# Patient Record
Sex: Female | Born: 1965 | Race: Black or African American | Hispanic: No | Marital: Single | Smoking: Never smoker
Health system: Southern US, Community
[De-identification: ages and names within clinical notes are randomized; demographics above are authoritative.]

## PROBLEM LIST (undated history)

## (undated) DIAGNOSIS — K219 Gastro-esophageal reflux disease without esophagitis: Secondary | ICD-10-CM

## (undated) DIAGNOSIS — I10 Essential (primary) hypertension: Secondary | ICD-10-CM

## (undated) DIAGNOSIS — E119 Type 2 diabetes mellitus without complications: Secondary | ICD-10-CM

## (undated) DIAGNOSIS — E669 Obesity, unspecified: Secondary | ICD-10-CM

## (undated) HISTORY — DX: Gastro-esophageal reflux disease without esophagitis: K21.9

## (undated) HISTORY — PX: HERNIA REPAIR: SHX51

## (undated) HISTORY — PX: BARIATRIC SURGERY: SHX1103

## (undated) HISTORY — PX: CHOLECYSTECTOMY: SHX55

---

## 2004-03-14 ENCOUNTER — Emergency Department (HOSPITAL_COMMUNITY): Admission: EM | Admit: 2004-03-14 | Discharge: 2004-03-14 | Payer: Self-pay | Admitting: Emergency Medicine

## 2004-04-17 ENCOUNTER — Emergency Department (HOSPITAL_COMMUNITY): Admission: EM | Admit: 2004-04-17 | Discharge: 2004-04-17 | Payer: Self-pay | Admitting: Emergency Medicine

## 2004-09-12 ENCOUNTER — Emergency Department: Payer: Self-pay | Admitting: General Practice

## 2004-10-07 ENCOUNTER — Emergency Department: Payer: Self-pay | Admitting: Emergency Medicine

## 2005-02-07 ENCOUNTER — Emergency Department: Payer: Self-pay | Admitting: Emergency Medicine

## 2005-02-07 ENCOUNTER — Emergency Department (HOSPITAL_COMMUNITY): Admission: EM | Admit: 2005-02-07 | Discharge: 2005-02-07 | Payer: Self-pay | Admitting: Emergency Medicine

## 2005-06-08 ENCOUNTER — Emergency Department: Payer: Self-pay | Admitting: Emergency Medicine

## 2005-06-09 ENCOUNTER — Other Ambulatory Visit: Payer: Self-pay

## 2005-08-17 ENCOUNTER — Emergency Department: Payer: Self-pay | Admitting: Emergency Medicine

## 2005-09-20 ENCOUNTER — Emergency Department: Payer: Self-pay | Admitting: Emergency Medicine

## 2005-12-12 ENCOUNTER — Emergency Department (HOSPITAL_COMMUNITY): Admission: EM | Admit: 2005-12-12 | Discharge: 2005-12-12 | Payer: Self-pay | Admitting: Emergency Medicine

## 2006-02-03 ENCOUNTER — Emergency Department (HOSPITAL_COMMUNITY): Admission: EM | Admit: 2006-02-03 | Discharge: 2006-02-03 | Payer: Self-pay | Admitting: Emergency Medicine

## 2006-04-13 ENCOUNTER — Emergency Department (HOSPITAL_COMMUNITY): Admission: EM | Admit: 2006-04-13 | Discharge: 2006-04-14 | Payer: Self-pay | Admitting: Emergency Medicine

## 2006-06-17 ENCOUNTER — Emergency Department (HOSPITAL_COMMUNITY): Admission: EM | Admit: 2006-06-17 | Discharge: 2006-06-18 | Payer: Self-pay | Admitting: Emergency Medicine

## 2006-08-13 ENCOUNTER — Emergency Department: Payer: Self-pay | Admitting: Emergency Medicine

## 2006-09-02 ENCOUNTER — Emergency Department (HOSPITAL_COMMUNITY): Admission: EM | Admit: 2006-09-02 | Discharge: 2006-09-02 | Payer: Self-pay | Admitting: Emergency Medicine

## 2006-12-07 ENCOUNTER — Emergency Department (HOSPITAL_COMMUNITY): Admission: EM | Admit: 2006-12-07 | Discharge: 2006-12-07 | Payer: Self-pay | Admitting: Emergency Medicine

## 2007-07-10 ENCOUNTER — Emergency Department (HOSPITAL_COMMUNITY): Admission: EM | Admit: 2007-07-10 | Discharge: 2007-07-11 | Payer: Self-pay | Admitting: *Deleted

## 2007-09-19 ENCOUNTER — Inpatient Hospital Stay (HOSPITAL_COMMUNITY): Admission: AD | Admit: 2007-09-19 | Discharge: 2007-09-20 | Payer: Self-pay | Admitting: Obstetrics & Gynecology

## 2007-11-06 ENCOUNTER — Emergency Department (HOSPITAL_COMMUNITY): Admission: EM | Admit: 2007-11-06 | Discharge: 2007-11-06 | Payer: Self-pay | Admitting: Emergency Medicine

## 2007-11-08 ENCOUNTER — Emergency Department (HOSPITAL_COMMUNITY): Admission: EM | Admit: 2007-11-08 | Discharge: 2007-11-09 | Payer: Self-pay | Admitting: Emergency Medicine

## 2008-04-15 ENCOUNTER — Emergency Department (HOSPITAL_COMMUNITY): Admission: EM | Admit: 2008-04-15 | Discharge: 2008-04-16 | Payer: Self-pay | Admitting: Emergency Medicine

## 2008-08-07 ENCOUNTER — Emergency Department (HOSPITAL_COMMUNITY): Admission: EM | Admit: 2008-08-07 | Discharge: 2008-08-07 | Payer: Self-pay | Admitting: Emergency Medicine

## 2008-10-15 ENCOUNTER — Emergency Department (HOSPITAL_COMMUNITY): Admission: EM | Admit: 2008-10-15 | Discharge: 2008-10-16 | Payer: Self-pay | Admitting: Emergency Medicine

## 2009-03-11 ENCOUNTER — Emergency Department (HOSPITAL_COMMUNITY): Admission: EM | Admit: 2009-03-11 | Discharge: 2009-03-12 | Payer: Self-pay | Admitting: Emergency Medicine

## 2009-05-04 ENCOUNTER — Emergency Department (HOSPITAL_COMMUNITY): Admission: EM | Admit: 2009-05-04 | Discharge: 2009-05-04 | Payer: Self-pay | Admitting: Emergency Medicine

## 2009-05-24 ENCOUNTER — Emergency Department (HOSPITAL_COMMUNITY): Admission: EM | Admit: 2009-05-24 | Discharge: 2009-05-24 | Payer: Self-pay | Admitting: Emergency Medicine

## 2010-01-28 ENCOUNTER — Emergency Department (HOSPITAL_COMMUNITY): Admission: EM | Admit: 2010-01-28 | Discharge: 2010-01-29 | Payer: Self-pay | Admitting: Emergency Medicine

## 2010-03-25 ENCOUNTER — Emergency Department (HOSPITAL_COMMUNITY): Admission: EM | Admit: 2010-03-25 | Discharge: 2010-03-26 | Payer: Self-pay | Admitting: Emergency Medicine

## 2010-04-05 ENCOUNTER — Emergency Department (HOSPITAL_COMMUNITY): Admission: EM | Admit: 2010-04-05 | Discharge: 2010-04-06 | Payer: Self-pay | Admitting: Emergency Medicine

## 2010-04-20 ENCOUNTER — Encounter (INDEPENDENT_AMBULATORY_CARE_PROVIDER_SITE_OTHER): Payer: Self-pay | Admitting: *Deleted

## 2010-04-20 ENCOUNTER — Ambulatory Visit: Payer: Self-pay | Admitting: Obstetrics and Gynecology

## 2010-04-20 LAB — CONVERTED CEMR LAB
Hgb A1c MFr Bld: 6.2 % — ABNORMAL HIGH (ref <5.7)
TSH: 1.175 microintl units/mL (ref 0.350–4.500)

## 2010-04-26 ENCOUNTER — Emergency Department (HOSPITAL_COMMUNITY): Admission: EM | Admit: 2010-04-26 | Discharge: 2010-04-27 | Payer: Self-pay | Admitting: Emergency Medicine

## 2010-05-04 ENCOUNTER — Ambulatory Visit (HOSPITAL_COMMUNITY)
Admission: RE | Admit: 2010-05-04 | Discharge: 2010-05-04 | Payer: Self-pay | Source: Home / Self Care | Admitting: Family Medicine

## 2010-05-06 ENCOUNTER — Ambulatory Visit: Payer: Self-pay | Admitting: Obstetrics & Gynecology

## 2010-06-01 ENCOUNTER — Ambulatory Visit (HOSPITAL_COMMUNITY): Admission: RE | Admit: 2010-06-01 | Payer: Self-pay | Source: Home / Self Care | Admitting: Obstetrics and Gynecology

## 2010-06-04 ENCOUNTER — Ambulatory Visit: Payer: Self-pay

## 2010-06-06 ENCOUNTER — Encounter: Payer: Self-pay | Admitting: *Deleted

## 2010-07-04 ENCOUNTER — Encounter: Payer: Self-pay | Admitting: *Deleted

## 2010-07-16 NOTE — Miscellaneous (Signed)
Summary: Do Not Reschedule  No showed NP appt.  Per [facility] policy is not allowed to reschedule.  Dennison Nancy RN  June 06, 2010 8:51 AM

## 2010-07-21 ENCOUNTER — Inpatient Hospital Stay (INDEPENDENT_AMBULATORY_CARE_PROVIDER_SITE_OTHER)
Admission: RE | Admit: 2010-07-21 | Discharge: 2010-07-21 | Disposition: A | Discharge status: Home / Self Care | Payer: PRIVATE HEALTH INSURANCE | Source: Ambulatory Visit | Attending: Family Medicine | Admitting: Family Medicine

## 2010-07-21 DIAGNOSIS — J069 Acute upper respiratory infection, unspecified: Secondary | ICD-10-CM

## 2010-08-17 ENCOUNTER — Emergency Department: Payer: Self-pay | Admitting: Emergency Medicine

## 2010-08-17 ENCOUNTER — Emergency Department (HOSPITAL_COMMUNITY)
Admission: EM | Admit: 2010-08-17 | Discharge: 2010-08-18 | Disposition: A | Discharge status: Home / Self Care | Payer: PRIVATE HEALTH INSURANCE | Attending: Emergency Medicine | Admitting: Emergency Medicine

## 2010-08-25 LAB — POCT I-STAT, CHEM 8
BUN: 9 mg/dL (ref 6–23)
Calcium, Ion: 1.18 mmol/L (ref 1.12–1.32)
Chloride: 105 mEq/L (ref 96–112)
Creatinine, Ser: 0.8 mg/dL (ref 0.4–1.2)
Glucose, Bld: 117 mg/dL — ABNORMAL HIGH (ref 70–99)
HCT: 37 % (ref 36.0–46.0)
Hemoglobin: 12.6 g/dL (ref 12.0–15.0)
Potassium: 4.1 mEq/L (ref 3.5–5.1)
Sodium: 138 mEq/L (ref 135–145)
TCO2: 23 mmol/L (ref 0–100)

## 2010-08-26 LAB — RAPID STREP SCREEN (MED CTR MEBANE ONLY): Streptococcus, Group A Screen (Direct): NEGATIVE

## 2010-09-18 LAB — COMPREHENSIVE METABOLIC PANEL
ALT: 12 U/L (ref 0–35)
AST: 15 U/L (ref 0–37)
Albumin: 3.7 g/dL (ref 3.5–5.2)
Alkaline Phosphatase: 51 U/L (ref 39–117)
BUN: 6 mg/dL (ref 6–23)
CO2: 26 mEq/L (ref 19–32)
Calcium: 9.2 mg/dL (ref 8.4–10.5)
Chloride: 109 mEq/L (ref 96–112)
Creatinine, Ser: 0.73 mg/dL (ref 0.4–1.2)
GFR calc Af Amer: >60 mL/min (ref >60)
GFR calc non Af Amer: >60 mL/min (ref >60)
Glucose, Bld: 117 mg/dL — ABNORMAL HIGH (ref 70–99)
Potassium: 4.2 mEq/L (ref 3.5–5.1)
Sodium: 138 mEq/L (ref 135–145)
Total Bilirubin: 0.8 mg/dL (ref 0.3–1.2)
Total Protein: 7.3 g/dL (ref 6.0–8.3)

## 2010-09-18 LAB — POCT PREGNANCY, URINE: Preg Test, Ur: NEGATIVE

## 2010-09-29 LAB — POCT I-STAT, CHEM 8
BUN: 10 mg/dL (ref 6–23)
Calcium, Ion: 1.16 mmol/L (ref 1.12–1.32)
Chloride: 106 mEq/L (ref 96–112)
Creatinine, Ser: 0.7 mg/dL (ref 0.4–1.2)
Glucose, Bld: 119 mg/dL — ABNORMAL HIGH (ref 70–99)
HCT: 33 % — ABNORMAL LOW (ref 36.0–46.0)
Hemoglobin: 11.2 g/dL — ABNORMAL LOW (ref 12.0–15.0)
Potassium: 3.6 mEq/L (ref 3.5–5.1)
Sodium: 138 mEq/L (ref 135–145)
TCO2: 23 mmol/L (ref 0–100)

## 2010-10-17 ENCOUNTER — Inpatient Hospital Stay (INDEPENDENT_AMBULATORY_CARE_PROVIDER_SITE_OTHER)
Admission: RE | Admit: 2010-10-17 | Discharge: 2010-10-17 | Disposition: A | Discharge status: Home / Self Care | Payer: PRIVATE HEALTH INSURANCE | Source: Ambulatory Visit | Attending: Family Medicine | Admitting: Family Medicine

## 2010-10-17 DIAGNOSIS — M7989 Other specified soft tissue disorders: Secondary | ICD-10-CM

## 2010-10-17 DIAGNOSIS — I10 Essential (primary) hypertension: Secondary | ICD-10-CM

## 2010-10-17 LAB — POCT URINALYSIS DIP (DEVICE)
Bilirubin Urine: NEGATIVE
Glucose, UA: NEGATIVE mg/dL
Hgb urine dipstick: NEGATIVE
Ketones, ur: NEGATIVE mg/dL
Nitrite: NEGATIVE
Protein, ur: NEGATIVE mg/dL
Specific Gravity, Urine: >=1.03 (ref 1.005–1.030)
Urobilinogen, UA: 1 mg/dL (ref 0.0–1.0)
pH: 6 (ref 5.0–8.0)

## 2010-10-17 LAB — POCT I-STAT, CHEM 8
BUN: 7 mg/dL (ref 6–23)
Calcium, Ion: 1.11 mmol/L — ABNORMAL LOW (ref 1.12–1.32)
Chloride: 103 mEq/L (ref 96–112)
Creatinine, Ser: 0.9 mg/dL (ref 0.4–1.2)
Glucose, Bld: 100 mg/dL — ABNORMAL HIGH (ref 70–99)
HCT: 39 % (ref 36.0–46.0)
Hemoglobin: 13.3 g/dL (ref 12.0–15.0)
Potassium: 3.7 mEq/L (ref 3.5–5.1)
Sodium: 138 mEq/L (ref 135–145)
TCO2: 24 mmol/L (ref 0–100)

## 2010-12-28 ENCOUNTER — Encounter: Payer: Self-pay | Admitting: *Deleted

## 2010-12-28 ENCOUNTER — Emergency Department (HOSPITAL_BASED_OUTPATIENT_CLINIC_OR_DEPARTMENT_OTHER)
Admission: EM | Admit: 2010-12-28 | Discharge: 2010-12-28 | Disposition: A | Discharge status: Home / Self Care | Payer: PRIVATE HEALTH INSURANCE | Attending: Emergency Medicine | Admitting: Emergency Medicine

## 2010-12-28 DIAGNOSIS — E860 Dehydration: Secondary | ICD-10-CM | POA: Insufficient documentation

## 2010-12-28 DIAGNOSIS — I10 Essential (primary) hypertension: Secondary | ICD-10-CM | POA: Insufficient documentation

## 2010-12-28 DIAGNOSIS — R110 Nausea: Secondary | ICD-10-CM | POA: Insufficient documentation

## 2010-12-28 DIAGNOSIS — R51 Headache: Secondary | ICD-10-CM | POA: Insufficient documentation

## 2010-12-28 HISTORY — DX: Essential (primary) hypertension: I10

## 2010-12-28 LAB — URINALYSIS, ROUTINE W REFLEX MICROSCOPIC
Glucose, UA: NEGATIVE mg/dL
Hgb urine dipstick: NEGATIVE
Protein, ur: NEGATIVE mg/dL
Specific Gravity, Urine: 1.022 (ref 1.005–1.030)

## 2010-12-28 LAB — BASIC METABOLIC PANEL
CO2: 24 mEq/L (ref 19–32)
Chloride: 101 mEq/L (ref 96–112)
Sodium: 134 mEq/L — ABNORMAL LOW (ref 135–145)

## 2010-12-28 LAB — PREGNANCY, URINE: Preg Test, Ur: NEGATIVE

## 2010-12-28 MED ORDER — ONDANSETRON 4 MG PO TBDP: 4.0000 mg | ORAL_TABLET | Freq: Three times a day (TID) | ORAL | Status: AC | PRN

## 2010-12-28 MED ORDER — KETOROLAC TROMETHAMINE 30 MG/ML IJ SOLN
30.0000 mg | Freq: Once | INTRAMUSCULAR | Status: AC
  Administered 2010-12-28: 30 mg via INTRAVENOUS
  Filled 2010-12-28: qty 1

## 2010-12-28 MED ORDER — ONDANSETRON HCL 4 MG/2ML IJ SOLN
4.0000 mg | Freq: Once | INTRAMUSCULAR | Status: AC
  Administered 2010-12-28: 4 mg via INTRAVENOUS
  Filled 2010-12-28: qty 2

## 2010-12-28 MED ORDER — SODIUM CHLORIDE 0.9 % IV BOLUS (SEPSIS)
1000.0000 mL | Freq: Once | INTRAVENOUS | Status: AC
  Administered 2010-12-28: 1000 mL via INTRAVENOUS

## 2010-12-28 NOTE — ED Provider Notes (Signed)
History     Chief Complaint  Patient presents with  . Fatigue   HPI Comments: Pt states that she awoke this AM feeling well, but then as the day went on she developed a headache on the top of the head an the back of the head and feeling achy all over.  She then tried to eat lunch - chicken breast sandwhich but became nauseated and vomited X 1 - she then improved but has persisted with nasuea for the last hour.  Nothing makes better or worse, no associated fevesrr, stiff neck, rash, abd pain, cough, sob, cp, blurred vision, weakness or numbness.  She admits to mild fatigue.  She has hx of Htn but no other medical problems.  Sx are moderate at this time.  The history is provided by the patient and a relative.    Past Medical History  Diagnosis Date  . Hypertension     History reviewed. No pertinent past surgical history.  History reviewed. No pertinent family history.  History  Substance Use Topics  . Smoking status: Never Smoker   . Smokeless tobacco: Never Used  . Alcohol Use: No    OB History    Grav Para Term Preterm Abortions TAB SAB Ect Mult Living                  Review of Systems  All other systems reviewed and are negative.    Physical Exam  BP 169/83  Pulse 82  Temp(Src) 98.9 F (37.2 C) (Oral)  Ht 5\' 4"  (1.626 m)  Wt 238 lb (107.956 kg)  BMI 40.85 kg/m2  SpO2 100%  Physical Exam  Constitutional: She appears well-developed and well-nourished. No distress.  HENT:  Head: Normocephalic and atraumatic.  Mouth/Throat: Oropharynx is clear and moist. No oropharyngeal exudate.  Eyes: Conjunctivae and EOM are normal. Pupils are equal, round, and reactive to light. Right eye exhibits no discharge. Left eye exhibits no discharge. No scleral icterus.  Neck: Normal range of motion. Neck supple. No JVD present. No thyromegaly present.  Cardiovascular: Normal rate, regular rhythm, normal heart sounds and intact distal pulses.  Exam reveals no gallop and no friction  rub.   No murmur heard. Pulmonary/Chest: Effort normal and breath sounds normal. No respiratory distress. She has no wheezes. She has no rales.  Abdominal: Soft. Bowel sounds are normal. She exhibits no distension and no mass. There is no tenderness.  Musculoskeletal: Normal range of motion. She exhibits edema ( mild bil LE edema (hx of same)). She exhibits no tenderness.  Lymphadenopathy:    She has no cervical adenopathy.  Neurological: She is alert. Coordination normal.  Skin: Skin is warm and dry. No rash noted. She is not diaphoretic. No erythema.  Psychiatric: She has a normal mood and affect. Her behavior is normal.    ED Course  Procedures  MDM Pt appears well but has mild ha which is only other sx - no focal neuro defectis - check BMP and UA / preg - meds for ha and nasuea.  The patient has improved almost completely with the Zofran and the IV fluids. Urinalysis confirms dehydration but no signs of renal dysfunction or significant electrolyte abnormalities. I discussed with patient the need for close followup and she agrees to return if symptoms worsen.    Vida Roller, MD 12/28/10 [phone]

## 2010-12-28 NOTE — ED Notes (Signed)
Pt here via ems from home states weak and shaky generally not feeling well all day today cbg per ems 85 no nausea vomiting diarrhea

## 2011-01-17 ENCOUNTER — Emergency Department (HOSPITAL_COMMUNITY)
Admission: EM | Admit: 2011-01-17 | Discharge: 2011-01-18 | Disposition: A | Discharge status: Home / Self Care | Payer: PRIVATE HEALTH INSURANCE | Attending: Emergency Medicine | Admitting: Emergency Medicine

## 2011-01-17 DIAGNOSIS — R51 Headache: Secondary | ICD-10-CM | POA: Insufficient documentation

## 2011-01-17 DIAGNOSIS — I10 Essential (primary) hypertension: Secondary | ICD-10-CM | POA: Insufficient documentation

## 2011-01-17 DIAGNOSIS — R112 Nausea with vomiting, unspecified: Secondary | ICD-10-CM | POA: Insufficient documentation

## 2011-01-17 DIAGNOSIS — E669 Obesity, unspecified: Secondary | ICD-10-CM | POA: Insufficient documentation

## 2011-01-17 DIAGNOSIS — Z79899 Other long term (current) drug therapy: Secondary | ICD-10-CM | POA: Insufficient documentation

## 2011-01-17 DIAGNOSIS — R630 Anorexia: Secondary | ICD-10-CM | POA: Insufficient documentation

## 2011-01-17 DIAGNOSIS — B9789 Other viral agents as the cause of diseases classified elsewhere: Secondary | ICD-10-CM | POA: Insufficient documentation

## 2011-01-17 DIAGNOSIS — IMO0001 Reserved for inherently not codable concepts without codable children: Secondary | ICD-10-CM | POA: Insufficient documentation

## 2011-01-17 DIAGNOSIS — R6883 Chills (without fever): Secondary | ICD-10-CM | POA: Insufficient documentation

## 2011-01-17 DIAGNOSIS — R070 Pain in throat: Secondary | ICD-10-CM | POA: Insufficient documentation

## 2011-01-17 LAB — POCT I-STAT, CHEM 8
BUN: 6 mg/dL (ref 6–23)
Chloride: 102 mEq/L (ref 96–112)
Creatinine, Ser: 0.8 mg/dL (ref 0.50–1.10)
Potassium: 3.8 mEq/L (ref 3.5–5.1)
Sodium: 138 mEq/L (ref 135–145)

## 2011-01-17 LAB — URINALYSIS, ROUTINE W REFLEX MICROSCOPIC
Glucose, UA: NEGATIVE mg/dL
Hgb urine dipstick: NEGATIVE
Specific Gravity, Urine: 1.017 (ref 1.005–1.030)
Urobilinogen, UA: 1 mg/dL (ref 0.0–1.0)

## 2011-01-17 LAB — CBC
HCT: 35.3 % — ABNORMAL LOW (ref 36.0–46.0)
MCV: 79.5 fL (ref 78.0–100.0)
Platelets: 364 10*3/uL (ref 150–400)
RBC: 4.44 MIL/uL (ref 3.87–5.11)
WBC: 6.6 10*3/uL (ref 4.0–10.5)

## 2011-03-09 LAB — SAMPLE TO BLOOD BANK

## 2011-03-09 LAB — CBC
HCT: 31.4 — ABNORMAL LOW
Hemoglobin: 10.9 — ABNORMAL LOW
MCHC: 34.6
MCV: 81.3
Platelets: 365
RBC: 3.86 — ABNORMAL LOW
RDW: 15.9 — ABNORMAL HIGH
WBC: 6.3

## 2011-03-09 LAB — POCT PREGNANCY, URINE
Operator id: 220991
Preg Test, Ur: NEGATIVE

## 2011-03-09 LAB — WET PREP, GENITAL: Yeast Wet Prep HPF POC: NONE SEEN

## 2011-03-10 LAB — CBC
HCT: 35.1 — ABNORMAL LOW
Hemoglobin: 11.7 — ABNORMAL LOW
RBC: 4.39
RDW: 13.9
WBC: 8.3

## 2011-03-10 LAB — COMPREHENSIVE METABOLIC PANEL
Alkaline Phosphatase: 52
BUN: 7
CO2: 26
Chloride: 102
Creatinine, Ser: 0.83
GFR calc non Af Amer: >60
Glucose, Bld: 115 — ABNORMAL HIGH
Total Bilirubin: 0.9

## 2011-03-10 LAB — RAPID STREP SCREEN (MED CTR MEBANE ONLY): Streptococcus, Group A Screen (Direct): NEGATIVE

## 2011-03-10 LAB — DIFFERENTIAL
Eosinophils Relative: 3
Lymphocytes Relative: 33
Lymphs Abs: 2.7
Monocytes Absolute: 0.7
Monocytes Relative: 9

## 2011-03-29 ENCOUNTER — Inpatient Hospital Stay (INDEPENDENT_AMBULATORY_CARE_PROVIDER_SITE_OTHER)
Admission: RE | Admit: 2011-03-29 | Discharge: 2011-03-29 | Disposition: A | Discharge status: Home / Self Care | Payer: PRIVATE HEALTH INSURANCE | Source: Ambulatory Visit | Attending: Family Medicine | Admitting: Family Medicine

## 2011-03-29 DIAGNOSIS — J40 Bronchitis, not specified as acute or chronic: Secondary | ICD-10-CM

## 2011-03-31 LAB — CBC
MCHC: 33.2
Platelets: 361
RBC: 4.03
WBC: 4.7

## 2011-03-31 LAB — COMPREHENSIVE METABOLIC PANEL
BUN: 9
CO2: 24
Chloride: 108
Creatinine, Ser: 0.7
GFR calc non Af Amer: >60
Glucose, Bld: 99
Total Bilirubin: 0.8

## 2011-03-31 LAB — DIFFERENTIAL
Basophils Absolute: 0
Lymphocytes Relative: 53 — ABNORMAL HIGH
Neutro Abs: 1.5 — ABNORMAL LOW

## 2011-03-31 LAB — B-NATRIURETIC PEPTIDE (CONVERTED LAB): Pro B Natriuretic peptide (BNP): 46

## 2011-04-07 ENCOUNTER — Emergency Department (HOSPITAL_COMMUNITY)
Admission: EM | Admit: 2011-04-07 | Discharge: 2011-04-08 | Disposition: A | Discharge status: Home / Self Care | Payer: PRIVATE HEALTH INSURANCE | Attending: Emergency Medicine | Admitting: Emergency Medicine

## 2011-04-07 DIAGNOSIS — R070 Pain in throat: Secondary | ICD-10-CM | POA: Insufficient documentation

## 2011-04-07 DIAGNOSIS — I10 Essential (primary) hypertension: Secondary | ICD-10-CM | POA: Insufficient documentation

## 2011-04-07 DIAGNOSIS — J039 Acute tonsillitis, unspecified: Secondary | ICD-10-CM | POA: Insufficient documentation

## 2011-07-16 ENCOUNTER — Other Ambulatory Visit: Payer: Self-pay

## 2011-07-16 ENCOUNTER — Encounter (HOSPITAL_COMMUNITY): Payer: Self-pay | Admitting: Emergency Medicine

## 2011-07-16 ENCOUNTER — Emergency Department (HOSPITAL_COMMUNITY)
Admission: EM | Admit: 2011-07-16 | Discharge: 2011-07-16 | Discharge status: Against Medical Advice | Payer: PRIVATE HEALTH INSURANCE | Attending: Emergency Medicine | Admitting: Emergency Medicine

## 2011-07-16 DIAGNOSIS — R110 Nausea: Secondary | ICD-10-CM | POA: Insufficient documentation

## 2011-07-16 DIAGNOSIS — R1013 Epigastric pain: Secondary | ICD-10-CM | POA: Insufficient documentation

## 2011-07-16 NOTE — ED Provider Notes (Signed)
Patient not in room at 1402, 1415, or 1430.   Date: 07/16/2011  Rate: 81  Rhythm: normal sinus rhythm  QRS Axis: right  Intervals: nl  ST/T Wave abnormalities: normal  Conduction Disutrbances:none  Narrative Interpretation:   Old EKG Reviewed: none available  Pt left without being seen  Devoria Albe, MD, Armando Gang     Ward Givens, MD 07/16/11 1435

## 2011-07-16 NOTE — ED Notes (Signed)
Pt here with epigastric pain, occurred this morning sts it is on and off, denies it being related to food. Pt is stable. sts she has not attempted OTC meds.

## 2011-07-16 NOTE — ED Notes (Signed)
Upper abd pain  Started this am 9 am and then went away  nausea

## 2011-07-16 NOTE — ED Notes (Signed)
Unable to locate pt  

## 2011-07-16 NOTE — ED Notes (Signed)
uanble to locate pt x 2.

## 2011-09-05 ENCOUNTER — Emergency Department (HOSPITAL_COMMUNITY)
Admission: EM | Admit: 2011-09-05 | Discharge: 2011-09-05 | Disposition: A | Discharge status: Home / Self Care | Payer: PRIVATE HEALTH INSURANCE | Attending: Emergency Medicine | Admitting: Emergency Medicine

## 2011-09-05 ENCOUNTER — Emergency Department (HOSPITAL_COMMUNITY): Payer: PRIVATE HEALTH INSURANCE

## 2011-09-05 ENCOUNTER — Emergency Department (INDEPENDENT_AMBULATORY_CARE_PROVIDER_SITE_OTHER)
Admission: EM | Admit: 2011-09-05 | Discharge: 2011-09-05 | Disposition: A | Discharge status: Home / Self Care | Payer: PRIVATE HEALTH INSURANCE | Source: Home / Self Care

## 2011-09-05 ENCOUNTER — Encounter (HOSPITAL_COMMUNITY): Payer: Self-pay | Admitting: *Deleted

## 2011-09-05 DIAGNOSIS — R51 Headache: Secondary | ICD-10-CM | POA: Insufficient documentation

## 2011-09-05 DIAGNOSIS — I10 Essential (primary) hypertension: Secondary | ICD-10-CM | POA: Insufficient documentation

## 2011-09-05 DIAGNOSIS — R112 Nausea with vomiting, unspecified: Secondary | ICD-10-CM | POA: Insufficient documentation

## 2011-09-05 MED ORDER — ONDANSETRON 4 MG PO TBDP
ORAL_TABLET | ORAL | Status: AC
  Filled 2011-09-05: qty 2

## 2011-09-05 MED ORDER — SODIUM CHLORIDE 0.9 % IV BOLUS (SEPSIS)
1000.0000 mL | Freq: Once | INTRAVENOUS | Status: AC
  Administered 2011-09-05: 1000 mL via INTRAVENOUS

## 2011-09-05 MED ORDER — HYDROMORPHONE HCL 1 MG/ML IJ SOLN: 1.0000 mg | Freq: Once | INTRAMUSCULAR | Status: DC

## 2011-09-05 MED ORDER — KETOROLAC TROMETHAMINE 30 MG/ML IJ SOLN
30.0000 mg | Freq: Once | INTRAMUSCULAR | Status: AC
  Administered 2011-09-05: 30 mg via INTRAVENOUS
  Filled 2011-09-05: qty 1

## 2011-09-05 MED ORDER — HYDROMORPHONE HCL PF 1 MG/ML IJ SOLN
INTRAMUSCULAR | Status: AC
  Filled 2011-09-05: qty 1

## 2011-09-05 MED ORDER — ONDANSETRON HCL 4 MG/2ML IJ SOLN
4.0000 mg | Freq: Once | INTRAMUSCULAR | Status: AC
  Administered 2011-09-05: 4 mg via INTRAVENOUS
  Filled 2011-09-05: qty 2

## 2011-09-05 MED ORDER — HYDROMORPHONE HCL 1 MG/ML IJ SOLN
1.0000 mg | Freq: Once | INTRAMUSCULAR | Status: AC
  Administered 2011-09-05: 1 mg via INTRAMUSCULAR

## 2011-09-05 MED ORDER — ONDANSETRON 4 MG PO TBDP
8.0000 mg | ORAL_TABLET | Freq: Once | ORAL | Status: AC
  Administered 2011-09-05: 8 mg via ORAL

## 2011-09-05 MED ORDER — DIPHENHYDRAMINE HCL 50 MG/ML IJ SOLN
25.0000 mg | Freq: Once | INTRAMUSCULAR | Status: AC
Start: 2011-09-05 — End: 2011-09-05
  Administered 2011-09-05: 25 mg via INTRAVENOUS
  Filled 2011-09-05: qty 1

## 2011-09-05 MED ORDER — PROMETHAZINE HCL 25 MG/ML IJ SOLN
12.5000 mg | Freq: Once | INTRAMUSCULAR | Status: AC
  Administered 2011-09-05: 12.5 mg via INTRAVENOUS
  Filled 2011-09-05: qty 1

## 2011-09-05 MED ORDER — LISINOPRIL 10 MG PO TABS: 10.0000 mg | ORAL_TABLET | Freq: Every day | ORAL | Status: DC

## 2011-09-05 NOTE — ED Notes (Signed)
Pt with c/o headache onset 2am this morning - left side of head - describes worse headache - nauseated denies vomiting  Pt had been taking lisinopril for htn ran out end of January - pt took extra strength tylenol at 730 am - with no relief

## 2011-09-05 NOTE — ED Provider Notes (Signed)
Medical screening examination/treatment/procedure(s) were performed by non-physician practitioner and as supervising physician I was immediately available for consultation/collaboration.  Doug Sou, MD 09/05/11 1755

## 2011-09-05 NOTE — ED Notes (Signed)
Pt with c/o headache/nausea - no neuro deficits - has not taken bp med since January ran out did not follow up with physician -

## 2011-09-05 NOTE — ED Notes (Signed)
Pt sent here from [facility] for ct scan due to severe headache this am since 0200. Has been out of bp meds x 2 months. Having nausea, left side headache no relief with tylenol. Given dilaudid and zofran at [facility].

## 2011-09-05 NOTE — ED Provider Notes (Signed)
Medical screening examination/treatment/procedure(s) were performed by non-physician practitioner and as supervising physician I was immediately available for consultation/collaboration.  Raynald Blend, MD 09/05/11 1148

## 2011-09-05 NOTE — ED Provider Notes (Signed)
History     CSN: [id number]  Arrival date & time 09/05/11  1115   First MD Initiated Contact with Patient 09/05/11 1210      Chief Complaint  Patient presents with  . Headache    (Consider location/radiation/quality/duration/timing/severity/associated sxs/prior treatment) HPI Natasha Doyle is a 46 yo female with a history of HTN, who presents to the ED at the request of the [facility].  Pt states that she awoke this morning with a headache around 2am.  States that she went back to sleep, but at 6 am, the headache was getting worse.  She describes the HA as a sharp pain, rating at 9/10 at 6am, which was on the left frontal and temporal side of her head.  She had some nausea and emesis with it, no vision changes, hearing changes, weakness or tingling on her body.  She did not took 2 acetaminophen before coming to the urgent care, but denies relief.  Since receiving medications at urgent care, she now rates the pain as a 7/10 and is resting and drowsy in bed.  She has not taken her blood pressure medication since the end of January.  She is followed by Deboraha Sprang for her HTN.  Patient denies chest pain, shortness of breath, weakness, numbness, or stiff neck.  Past Medical History  Diagnosis Date  . Hypertension     History reviewed. No pertinent past surgical history.  History reviewed. No pertinent family history.  History  Substance Use Topics  . Smoking status: Never Smoker   . Smokeless tobacco: Never Used  . Alcohol Use: No    OB History    Grav Para Term Preterm Abortions TAB SAB Ect Mult Living                  Review of Systems All pertinent positives and negatives reviewed in the history of present illness  Allergies  Review of patient's allergies indicates no known allergies.  Home Medications   Current Outpatient Rx  Name Route Sig Dispense Refill  . ACETAMINOPHEN 500 MG PO TABS Oral Take 1,000 mg by mouth every 4 (four) hours as needed. For pain    .  NAPROXEN PO Oral Take 1 tablet by mouth daily as needed. For pain.      BP 141/65  Pulse 73  Temp(Src) 98.3 F (36.8 C) (Oral)  Resp 16  SpO2 96%  LMP 06/28/2011  Physical Exam  Constitutional: She is oriented to person, place, and time. She appears well-developed and well-nourished. No distress.  Eyes: EOM are normal. Pupils are equal, round, and reactive to light.  Neck: Normal range of motion. Neck supple.  Cardiovascular: Normal rate, regular rhythm, normal heart sounds and intact distal pulses.   No murmur heard. Pulmonary/Chest: Effort normal and breath sounds normal. No respiratory distress. She has no wheezes.  Neurological: She is alert and oriented to person, place, and time. She has normal strength. She is not disoriented. No sensory deficit. Coordination normal. GCS eye subscore is 4. GCS verbal subscore is 5. GCS motor subscore is 6.       Patient has normal strength in all 4 extremities.  She also has normal gross sensation intact.  Patient has normal pupils.  Skin: Skin is warm. No rash noted. She is not diaphoretic. No erythema.    ED Course  Procedures (including critical care time)  Labs Reviewed - No data to display No results found.  Pt seen and assessed.  BP has come  down and is now 140's systolic.  HA has decreased with pain meds given and is now 7/10.  She was advised to f/u with [facility] for her BP as early death by MI is prominent in her family.   2:23 PM Pt seen states that nausea is better with additional antiemetic.  HA is still decreasing.  Does not feel she can have a PO trial yet.   3:11 PM Patient is feeling completely better at this time.  She would like to have some ginger ale.  I have advised her to follow up with her primary care doctor for recheck and further control of her blood pressure. 3:51 PM Patient tolerated orals. She has no further symptoms MDM  MDM Reviewed: nursing note and vitals Interpretation: labs and CT  scan            Carlyle Dolly, PA-C 09/05/11 1552

## 2011-09-05 NOTE — Discharge Instructions (Signed)
Return here as needed. Follow up with your doctor for a recheck. °

## 2011-09-05 NOTE — ED Provider Notes (Signed)
History     CSN: [id number]  Arrival date & time 09/05/11  6578   None     Chief Complaint  Patient presents with  . Headache  . Nausea  . Neck Pain  . Shoulder Pain    (Consider location/radiation/quality/duration/timing/severity/associated sxs/prior treatment) HPI Comments: Patient presents today with complaints of headache. She states that she awoke at 2 AM this morning with a left-sided sharp headache. She states this is the worst headache of her life. She denies visual changes, numbness, tingling or weakness. She does have nausea and vomited once in the exam room.She has a history of hypertension and has been out of her blood pressure medications for the last 2 months. She tried acetaminophen at home 3-1/2 hours ago without any improvement. She feels that the headache is progressively worsening. She states is currently a 9 on a scale of 1-10   Past Medical History  Diagnosis Date  . Hypertension     History reviewed. No pertinent past surgical history.  History reviewed. No pertinent family history.  History  Substance Use Topics  . Smoking status: Never Smoker   . Smokeless tobacco: Never Used  . Alcohol Use: No    OB History    Grav Para Term Preterm Abortions TAB SAB Ect Mult Living                  Review of Systems  Constitutional: Negative for fever and chills.  Eyes: Negative for photophobia and visual disturbance.  Respiratory: Negative for cough.   Cardiovascular: Negative for chest pain.  Gastrointestinal: Positive for nausea and vomiting.  Neurological: Positive for headaches. Negative for dizziness, weakness and numbness.    Allergies  Review of patient's allergies indicates no known allergies.  Home Medications   Current Outpatient Rx  Name Route Sig Dispense Refill  . ACETAMINOPHEN 500 MG PO TABS Oral Take 1,000 mg by mouth as needed. For pain     . IBUPROFEN 200 MG PO TABS Oral Take 800 mg by mouth every 6 (six) hours as needed.  Ibuprofen    . LISINOPRIL 10 MG PO TABS Oral Take 10 mg by mouth daily. RAN OUT JANUARY      BP 182/86  Pulse 79  Temp(Src) 97.8 F (36.6 C) (Oral)  Resp 18  SpO2 99%  LMP 06/28/2011  Physical Exam  Nursing note and vitals reviewed. Constitutional: She appears well-developed and well-nourished. No distress.  HENT:  Head: Normocephalic and atraumatic.  Right Ear: Tympanic membrane, external ear and ear canal normal.  Left Ear: Tympanic membrane, external ear and ear canal normal.  Nose: Nose normal.  Mouth/Throat: Uvula is midline, oropharynx is clear and moist and mucous membranes are normal. No oropharyngeal exudate, posterior oropharyngeal edema or posterior oropharyngeal erythema.  Eyes: Conjunctivae, EOM and lids are normal.  Fundoscopic exam:      The right eye shows no hemorrhage and no papilledema.       The left eye shows no hemorrhage and no papilledema.  Neck: Neck supple.  Cardiovascular: Normal rate, regular rhythm and normal heart sounds.   Pulses:      Radial pulses are 2+ on the right side, and 2+ on the left side.  Pulmonary/Chest: Effort normal and breath sounds normal. No respiratory distress.  Lymphadenopathy:    She has no cervical adenopathy.  Neurological: She is alert. She has normal strength. No cranial nerve deficit. Gait normal.  Reflex Scores:      Bicep reflexes are 2+ on  the right side and 2+ on the left side. Skin: Skin is warm and dry.  Psychiatric: She has a normal mood and affect.    ED Course  Procedures (including critical care time)  Labs Reviewed - No data to display No results found.   1. Headache   2. Hypertension       MDM  Pt transferred to [facility] [place], [name] 09/05/11 1056

## 2011-09-05 NOTE — ED Notes (Signed)
Pt back from CT

## 2011-09-05 NOTE — ED Notes (Signed)
Patient transported to CT 

## 2011-09-05 NOTE — ED Notes (Signed)
Nauseated, heaving, will notify [facility]

## 2011-09-05 NOTE — ED Provider Notes (Signed)
History     CSN: [id number]  Arrival date & time 09/05/11  1115   First MD Initiated Contact with Patient 09/05/11 1210      Chief Complaint  Patient presents with  . Headache    (Consider location/radiation/quality/duration/timing/severity/associated sxs/prior treatment) HPI  Past Medical History  Diagnosis Date  . Hypertension     History reviewed. No pertinent past surgical history.  History reviewed. No pertinent family history.  History  Substance Use Topics  . Smoking status: Never Smoker   . Smokeless tobacco: Never Used  . Alcohol Use: No    OB History    Grav Para Term Preterm Abortions TAB SAB Ect Mult Living                  Review of Systems  Allergies  Review of patient's allergies indicates no known allergies.  Home Medications   Current Outpatient Rx  Name Route Sig Dispense Refill  . ACETAMINOPHEN 500 MG PO TABS Oral Take 1,000 mg by mouth every 4 (four) hours as needed. For pain    . NAPROXEN PO Oral Take 1 tablet by mouth daily as needed. For pain.      BP 151/76  Pulse 78  Temp(Src) 98.3 F (36.8 C) (Oral)  Resp 18  SpO2 99%  LMP 06/28/2011  Physical Exam  ED Course  Procedures (including critical care time)  Labs Reviewed - No data to display Ct Head Wo Contrast  09/05/2011  *RADIOLOGY REPORT*  Clinical Data: Headache.  High blood pressure.  Nausea and dizziness.  CT HEAD WITHOUT CONTRAST  Technique:  Contiguous axial images were obtained from the base of the skull through the vertex without contrast.  Comparison: None.  Findings: Bone windows demonstrate clear paranasal sinuses and mastoid air cells.  Soft tissue windows demonstrate no  mass lesion, hemorrhage, hydrocephalus, acute infarct, intra-axial, or extra-axial fluid collection.  IMPRESSION: Normal head CT.  Original Report Authenticated By: Consuello Bossier, M.D.     No diagnosis found.    MDM  Duplicate note        Doug Sou, MD 09/05/11 1755

## 2011-12-01 ENCOUNTER — Encounter (HOSPITAL_COMMUNITY): Payer: Self-pay | Admitting: Emergency Medicine

## 2011-12-01 DIAGNOSIS — K089 Disorder of teeth and supporting structures, unspecified: Secondary | ICD-10-CM | POA: Insufficient documentation

## 2011-12-01 NOTE — ED Notes (Signed)
PT. REPORTS RIGHT LOW MOLAR PAIN ONSET TODAY .

## 2011-12-02 ENCOUNTER — Emergency Department (HOSPITAL_COMMUNITY)
Admission: EM | Admit: 2011-12-02 | Discharge: 2011-12-02 | Disposition: A | Discharge status: Home / Self Care | Payer: PRIVATE HEALTH INSURANCE | Attending: Emergency Medicine | Admitting: Emergency Medicine

## 2011-12-02 DIAGNOSIS — K0889 Other specified disorders of teeth and supporting structures: Secondary | ICD-10-CM

## 2011-12-04 ENCOUNTER — Encounter (HOSPITAL_COMMUNITY): Payer: Self-pay | Admitting: *Deleted

## 2011-12-04 ENCOUNTER — Emergency Department (HOSPITAL_COMMUNITY)
Admission: EM | Admit: 2011-12-04 | Discharge: 2011-12-04 | Disposition: A | Discharge status: Home / Self Care | Payer: PRIVATE HEALTH INSURANCE | Attending: Emergency Medicine | Admitting: Emergency Medicine

## 2011-12-04 DIAGNOSIS — K047 Periapical abscess without sinus: Secondary | ICD-10-CM | POA: Insufficient documentation

## 2011-12-04 MED ORDER — HYDROCODONE-ACETAMINOPHEN 5-325 MG PO TABS: 1.0000 | ORAL_TABLET | ORAL | Status: AC | PRN

## 2011-12-04 MED ORDER — AMOXICILLIN 500 MG PO CAPS: 500.0000 mg | ORAL_CAPSULE | Freq: Three times a day (TID) | ORAL | Status: DC

## 2011-12-04 MED ORDER — AMOXICILLIN 500 MG PO CAPS
500.0000 mg | ORAL_CAPSULE | Freq: Once | ORAL | Status: AC
  Administered 2011-12-04: 500 mg via ORAL
  Filled 2011-12-04: qty 1

## 2011-12-04 MED ORDER — HYDROCODONE-ACETAMINOPHEN 5-325 MG PO TABS
1.0000 | ORAL_TABLET | Freq: Once | ORAL | Status: AC
  Administered 2011-12-04: 1 via ORAL
  Filled 2011-12-04: qty 1

## 2011-12-04 NOTE — ED Provider Notes (Signed)
Medical screening examination/treatment/procedure(s) were performed by non-physician practitioner and as supervising physician I was immediately available for consultation/collaboration.  Ethelda Chick, MD 12/04/11 [phone]

## 2011-12-04 NOTE — Discharge Instructions (Signed)
Abscessed Tooth A tooth abscess is a collection of infected fluid (pus) from a bacterial infection in the inner part of the tooth (pulp). It usually occurs at the end of the tooth's root.  CAUSES   A very bad cavity (extensive tooth decay).   Trauma to the tooth, such as a broken or chipped tooth, that allows bacteria to enter into the pulp.  SYMPTOMS  Severe pain in and around the infected tooth.   Swelling and redness around the abscessed tooth or in the mouth or face.   Tenderness.   Pus drainage.   Bad breath.   Bitter taste in the mouth.   Difficulty swallowing.   Difficulty opening the mouth.   Feeling sick to your stomach (nauseous).   Vomiting.   Chills.   Swollen neck glands.  DIAGNOSIS  A medical and dental history will be taken.   An examination will be performed by tapping on the abscessed tooth.   X-rays may be taken of the tooth to identify the abscess.  TREATMENT The goal of treatment is to eliminate the infection.   You may be prescribed antibiotic medicine to stop the infection from spreading.   A root canal may be performed to save the tooth. If the tooth cannot be saved, it may be pulled (extracted) and the abscess may be drained.  HOME CARE INSTRUCTIONS  Only take over-the-counter or prescription medicines for pain, fever, or discomfort as directed by your caregiver.   Do not drive after taking pain medicine (narcotics).   Rinse your mouth (gargle) often with salt water ( tsp salt in 8 oz of warm water) to relieve pain or swelling.   Do not apply heat to the outside of your face.   Return to your dentist for further treatment as directed.  SEEK IMMEDIATE DENTAL CARE IF:  You have a temperature by mouth above 102 F (38.9 C), not controlled by medicine.   You have chills or a very bad headache.   You have problems breathing or swallowing.   Your have trouble opening your mouth.   You develop swelling in the neck or around the eye.    Your pain is not helped by medicine.   Your pain is getting worse instead of better.  Document Released: 05/31/2005 Document Revised: 05/20/2011 Document Reviewed: 09/08/2010 Exit[employer] Patient Information 2012 [employer].Dental Care and Dentist Visits Dental care supports good overall health. Regular dental visits can also help you avoid dental pain, bleeding, infection, and other more serious health problems in the future. It is important to keep the mouth healthy because diseases in the teeth, gums, and other oral tissues can spread to other areas of the body. Some problems, such as diabetes, heart disease, and pre-term labor have been associated with poor oral health.  See your dentist every 6 months. If you experience emergency problems such as a toothache or broken tooth, go to the dentist right away. If you see your dentist regularly, you may catch problems early. It is easier to be treated for problems in the early stages.  WHAT TO EXPECT AT A DENTIST VISIT  Your dentist will look for many common oral health problems and recommend proper treatment. At your regular dental visit, you can expect:  Gentle cleaning of the teeth and gums. This includes scraping and polishing. This helps to remove the sticky substance around the teeth and gums (plaque). Plaque forms in the mouth shortly after eating. Over time, plaque hardens on the teeth as tartar.   If tartar is not removed regularly, it can cause problems. Cleaning also helps remove stains.   Periodic X-rays. These pictures of the teeth and supporting bone will help your dentist assess the health of your teeth.   Periodic fluoride treatments. Fluoride is a natural mineral shown to help strengthen teeth. Fluoride treatmentinvolves applying a fluoride gel or varnish to the teeth. It is most commonly done in children.   Examination of the mouth, tongue, jaws, teeth, and gums to look for any oral health problems, such as:   Cavities (dental  caries). This is decay on the tooth caused by plaque, sugar, and acid in the mouth. It is best to catch a cavity when it is small.   Inflammation of the gums caused by plaque buildup (gingivitis).   Problems with the mouth or malformed or misaligned teeth.   Oral cancer or other diseases of the soft tissues or jaws.  KEEP YOUR TEETH AND GUMS HEALTHY For healthy teeth and gums, follow these general guidelines as well as your dentist's specific advice:  Have your teeth professionally cleaned at the dentist every 6 months.   Brush twice daily with a fluoride toothpaste.   Floss your teeth daily.   Ask your dentist if you need fluoride supplements, treatments, or fluoride toothpaste.   Eat a healthy diet. Reduce foods and drinks with added sugar.   Avoid smoking.  TREATMENT FOR ORAL HEALTH PROBLEMS If you have oral health problems, treatment varies depending on the conditions present in your teeth and gums.  Your caregiver will most likely recommend good oral hygiene at each visit.   For cavities, gingivitis, or other oral health disease, your caregiver will perform a procedure to treat the problem. This is typically done at a separate appointment. Sometimes your caregiver will refer you to another dental specialist for specific tooth problems or for surgery.  SEEK IMMEDIATE DENTAL CARE IF:  You have pain, bleeding, or soreness in the gum, tooth, jaw, or mouth area.   A permanent tooth becomes loose or separated from the gum socket.   You experience a blow or injury to the mouth or jaw area.  Document Released: 02/10/2011 Document Revised: 05/20/2011 Document Reviewed: 02/10/2011 [employer] Patient Information 2012 [employer]. 

## 2011-12-04 NOTE — ED Notes (Signed)
Toothache for 2-3 days 

## 2011-12-04 NOTE — ED Provider Notes (Signed)
History     CSN: [id number]  Arrival date & time 12/04/11  0021   First MD Initiated Contact with Patient 12/04/11 0127      Chief Complaint  Patient presents with  . Dental Pain    (Consider location/radiation/quality/duration/timing/severity/associated sxs/prior treatment) HPI Comments: Patient here with right upper 2nd molar pain and gum swelling - states that the pain is now radiating to her right ear - denies fever, chills, nausea, vomiting - states has not seen her dentist yet for this - is able to open her mouth and swallow without difficulty.  Patient is a 46 y.o. female presenting with tooth pain. The history is provided by the patient. No language interpreter was used.  Dental PainThe primary symptoms include mouth pain. Primary symptoms do not include dental injury, oral bleeding, oral lesions, headaches, fever, shortness of breath, sore throat, angioedema or cough. The symptoms began 3 to 5 days ago. The symptoms are worsening. The symptoms are new. The symptoms occur constantly.  Additional symptoms include: dental sensitivity to temperature, gum swelling, gum tenderness, purulent gums and jaw pain. Additional symptoms do not include: trismus, facial swelling, trouble swallowing, pain with swallowing, excessive salivation, dry mouth, taste disturbance, smell disturbance, drooling, ear pain, hearing loss, nosebleeds, swollen glands, goiter and fatigue.    Past Medical History  Diagnosis Date  . Hypertension     Past Surgical History  Procedure Date  . Cholecystectomy     No family history on file.  History  Substance Use Topics  . Smoking status: Never Smoker   . Smokeless tobacco: Never Used  . Alcohol Use: No    OB History    Grav Para Term Preterm Abortions TAB SAB Ect Mult Living                  Review of Systems  Constitutional: Negative for fever and fatigue.  HENT: Positive for dental problem. Negative for hearing loss, ear pain, nosebleeds, sore  throat, facial swelling, drooling and trouble swallowing.   Eyes: Negative for pain.  Respiratory: Negative for cough and shortness of breath.   Cardiovascular: Negative for chest pain.  Gastrointestinal: Negative for nausea, vomiting and abdominal pain.  Genitourinary: Negative for dysuria.  Musculoskeletal: Negative for arthralgias.  Neurological: Negative for headaches.  All other systems reviewed and are negative.    Allergies  Review of patient's allergies indicates no known allergies.  Home Medications   Current Outpatient Rx  Name Route Sig Dispense Refill  . LISINOPRIL 10 MG PO TABS Oral Take 1 tablet (10 mg total) by mouth daily. 15 tablet 0  . NAPROXEN SODIUM 220 MG PO TABS Oral Take 220 mg by mouth 2 (two) times daily as needed. For pain      BP 166/90  Pulse 83  Temp 98.7 F (37.1 C) (Oral)  Resp 17  SpO2 100%  LMP 11/14/2011  Physical Exam  Nursing note and vitals reviewed. Constitutional: She is oriented to person, place, and time. She appears well-developed and well-nourished. No distress.  HENT:  Head: Normocephalic and atraumatic.  Right Ear: External ear normal.  Left Ear: External ear normal.  Nose: Nose normal.  Mouth/Throat: Oropharynx is clear and moist. Abnormal dentition. Dental abscesses and dental caries present. No oropharyngeal exudate.    Eyes: Conjunctivae are normal. Pupils are equal, round, and reactive to light. No scleral icterus.  Neck: Normal range of motion. Neck supple.  Cardiovascular: Normal rate, regular rhythm and normal heart sounds.  Exam reveals no  gallop and no friction rub.   No murmur heard. Pulmonary/Chest: Effort normal and breath sounds normal. No respiratory distress. She has no wheezes. She has no rales. She exhibits no tenderness.  Abdominal: Soft. Bowel sounds are normal. She exhibits no distension. There is no tenderness.  Musculoskeletal: Normal range of motion. She exhibits no edema and no tenderness.    Lymphadenopathy:    She has no cervical adenopathy.  Neurological: She is alert and oriented to person, place, and time. No cranial nerve deficit. She exhibits normal muscle tone. Coordination normal.  Skin: Skin is warm and dry. No rash noted. No erythema. No pallor.  Psychiatric: She has a normal mood and affect. Her behavior is normal. Judgment and thought content normal.    ED Course  Procedures (including critical care time)  Labs Reviewed - No data to display No results found.   Dental abscess    MDM  Patient here with right upper molar pain with marked swelling and abscess to the gum area - no trismus no evidence of ludwigs angina - given medication here and will follow up with her dentist.        Scarlette Calico C. [place], [name] 12/04/11 [phone]

## 2011-12-04 NOTE — ED Notes (Signed)
Pt c/o R molar pain since Tuesday that is now radiating to her R ear.  Pt c/o chills. Naproxyn use was working, but no longer helps.

## 2011-12-07 ENCOUNTER — Encounter (HOSPITAL_COMMUNITY): Payer: Self-pay

## 2011-12-07 ENCOUNTER — Emergency Department (INDEPENDENT_AMBULATORY_CARE_PROVIDER_SITE_OTHER)
Admission: EM | Admit: 2011-12-07 | Discharge: 2011-12-07 | Disposition: A | Discharge status: Home / Self Care | Payer: PRIVATE HEALTH INSURANCE | Source: Home / Self Care | Attending: Family Medicine | Admitting: Family Medicine

## 2011-12-07 DIAGNOSIS — K0889 Other specified disorders of teeth and supporting structures: Secondary | ICD-10-CM

## 2011-12-07 DIAGNOSIS — T85898A Other specified complication of other internal prosthetic devices, implants and grafts, initial encounter: Secondary | ICD-10-CM

## 2011-12-07 MED ORDER — IBUPROFEN 600 MG PO TABS: 600.0000 mg | ORAL_TABLET | Freq: Three times a day (TID) | ORAL | Status: AC

## 2011-12-07 MED ORDER — CLINDAMYCIN HCL 300 MG PO CAPS: 300.0000 mg | ORAL_CAPSULE | Freq: Four times a day (QID) | ORAL | Status: AC

## 2011-12-07 NOTE — ED Notes (Signed)
C/o toothache (rt lower) since last Tuesday.  States seen in ED 3 days ago but no improvement with amoxicillin and vicodin.  Doesn't have a dentist

## 2011-12-07 NOTE — Discharge Instructions (Signed)
You need to see a dentist for definitive treatment of her dental problem. Call number provided above if you need a new dentist to make an appointment. Take the prescribed medications as instructed. You can alternate ibuprofen every 8 hours with Vicodin every 6 hours as needed for pain. Continue to use antibacterial mouth wash and Orajel.

## 2011-12-09 ENCOUNTER — Ambulatory Visit: Payer: PRIVATE HEALTH INSURANCE | Admitting: Obstetrics and Gynecology

## 2011-12-09 NOTE — ED Provider Notes (Signed)
History     CSN: [id number]  Arrival date & time 12/07/11  1550   First MD Initiated Contact with Patient 12/07/11 1605      Chief Complaint  Patient presents with  . Dental Pain    (Consider location/radiation/quality/duration/timing/severity/associated sxs/prior treatment) HPI Comments: 46 year old female with history of hypertension here complaining of tooth ache for about 5 days was seen in the emergency department 3 days ago had a prescription for amoxicillin and Vicodin. Here stating pain remains the same. Denies a swelling or spontaneous drainage.no fever or chills. Has not seen a dentist as patient has the misconception that a "dentist won't see her until infection is clear".  No chest pain or shortness of breath.    Past Medical History  Diagnosis Date  . Hypertension     Past Surgical History  Procedure Date  . Cholecystectomy     No family history on file.  History  Substance Use Topics  . Smoking status: Never Smoker   . Smokeless tobacco: Never Used  . Alcohol Use: No    OB History    Grav Para Term Preterm Abortions TAB SAB Ect Mult Living                  Review of Systems  Constitutional: Negative for fever and chills.  HENT: Positive for dental problem. Negative for facial swelling and neck pain.   Respiratory: Negative for cough, chest tightness and shortness of breath.   Cardiovascular: Negative for chest pain and leg swelling.  Neurological: Negative for headaches.    Allergies  Review of patient's allergies indicates no known allergies.  Home Medications   Current Outpatient Rx  Name Route Sig Dispense Refill  . CLINDAMYCIN HCL 300 MG PO CAPS Oral Take 1 capsule (300 mg total) by mouth 4 (four) times daily. 28 capsule 0  . HYDROCODONE-ACETAMINOPHEN 5-325 MG PO TABS Oral Take 1 tablet by mouth every 4 (four) hours as needed for pain. 30 tablet 0  . IBUPROFEN 600 MG PO TABS Oral Take 1 tablet (600 mg total) by mouth 3 (three) times daily.  20 tablet 0  . LISINOPRIL 10 MG PO TABS Oral Take 1 tablet (10 mg total) by mouth daily. 15 tablet 0    BP 142/82  Pulse 88  Temp 98.8 F (37.1 C) (Oral)  Resp 20  SpO2 100%  LMP 11/07/2011  Physical Exam  Nursing note and vitals reviewed. Constitutional: She is oriented to person, place, and time. She appears well-developed and well-nourished. No distress.  HENT:  Head: Normocephalic and atraumatic.  Mouth/Throat: No oropharyngeal exudate.       No facial swelling. Metal frontal tooth. There is reported pain with gum palpation around right lower molar. No obvious fluctuation. No dental fracture. No spontaneous drainage.  Neck: Normal range of motion. Neck supple.  Cardiovascular: Normal rate, regular rhythm and normal heart sounds.   No murmur heard. Pulmonary/Chest: Breath sounds normal.  Lymphadenopathy:    She has no cervical adenopathy.  Neurological: She is alert and oriented to person, place, and time.  Skin: No rash noted.    ED Course  Procedures (including critical care time)  Labs Reviewed - No data to display No results found.   1. Pain, dental       MDM  Treated with clindamycin and ibuprofen patient has Vicodin recent prescription from last ED visit. Asked to follow up with a dentist for definitive treatment.         Aviva Wolfer  Moreno-Coll, MD 12/11/11 1253

## 2012-01-08 ENCOUNTER — Other Ambulatory Visit: Payer: Self-pay

## 2012-01-08 ENCOUNTER — Encounter (HOSPITAL_COMMUNITY): Payer: Self-pay

## 2012-01-08 ENCOUNTER — Emergency Department (HOSPITAL_COMMUNITY)
Admission: EM | Admit: 2012-01-08 | Discharge: 2012-01-08 | Disposition: A | Discharge status: Home / Self Care | Payer: PRIVATE HEALTH INSURANCE | Attending: Emergency Medicine | Admitting: Emergency Medicine

## 2012-01-08 ENCOUNTER — Emergency Department (HOSPITAL_COMMUNITY): Payer: PRIVATE HEALTH INSURANCE

## 2012-01-08 DIAGNOSIS — R7309 Other abnormal glucose: Secondary | ICD-10-CM | POA: Insufficient documentation

## 2012-01-08 DIAGNOSIS — E871 Hypo-osmolality and hyponatremia: Secondary | ICD-10-CM | POA: Insufficient documentation

## 2012-01-08 DIAGNOSIS — E876 Hypokalemia: Secondary | ICD-10-CM

## 2012-01-08 DIAGNOSIS — R609 Edema, unspecified: Secondary | ICD-10-CM | POA: Insufficient documentation

## 2012-01-08 DIAGNOSIS — I10 Essential (primary) hypertension: Secondary | ICD-10-CM | POA: Insufficient documentation

## 2012-01-08 DIAGNOSIS — R739 Hyperglycemia, unspecified: Secondary | ICD-10-CM

## 2012-01-08 DIAGNOSIS — R600 Localized edema: Secondary | ICD-10-CM

## 2012-01-08 LAB — COMPREHENSIVE METABOLIC PANEL
ALT: 12 U/L (ref 0–35)
AST: 15 U/L (ref 0–37)
Albumin: 3.7 g/dL (ref 3.5–5.2)
Alkaline Phosphatase: 56 U/L (ref 39–117)
GFR calc Af Amer: 86 mL/min — ABNORMAL LOW (ref >90)
Glucose, Bld: 119 mg/dL — ABNORMAL HIGH (ref 70–99)
Potassium: 3.4 mEq/L — ABNORMAL LOW (ref 3.5–5.1)
Sodium: 132 mEq/L — ABNORMAL LOW (ref 135–145)
Total Protein: 7.9 g/dL (ref 6.0–8.3)

## 2012-01-08 LAB — CBC WITH DIFFERENTIAL/PLATELET
Basophils Absolute: 0 10*3/uL (ref 0.0–0.1)
Eosinophils Absolute: 0.2 10*3/uL (ref 0.0–0.7)
Lymphs Abs: 2.7 10*3/uL (ref 0.7–4.0)
MCH: 27.7 pg (ref 26.0–34.0)
Neutrophils Relative %: 47 % (ref 43–77)
Platelets: 338 10*3/uL (ref 150–400)
RBC: 4.3 MIL/uL (ref 3.87–5.11)
RDW: 15 % (ref 11.5–15.5)
WBC: 6.5 10*3/uL (ref 4.0–10.5)

## 2012-01-08 MED ORDER — FUROSEMIDE 20 MG PO TABS: 20.0000 mg | ORAL_TABLET | Freq: Two times a day (BID) | ORAL | Status: DC

## 2012-01-08 NOTE — ED Notes (Signed)
Pt here for fluid retention.

## 2012-01-08 NOTE — ED Notes (Signed)
Patient is alert and oriented x4, no complaints of pain.  Patient did not have any questions in reference to her discharge instructions. Patient verbalized understanding.  Patient is driving herself home, however her 47 yo niece and grandson are with her.

## 2012-01-08 NOTE — ED Provider Notes (Addendum)
History     CSN: [id number]  Arrival date & time 01/08/12  1452   First MD Initiated Contact with Patient 01/08/12 1814      Chief Complaint  Patient presents with  . Leg Swelling    (Consider location/radiation/quality/duration/timing/severity/associated sxs/prior treatment) The history is provided by the patient and medical records.   she has a history of hypertension.  She complains of bilateral lower extremity, swelling.  She denies cough, chest pain, or shortness of breath.  She denies nausea, vomiting, fevers, chills, rash.  She denies history of kidney disease, or liver disease.  She denies nocturia, or orthopnea.  She denies dyspnea on exertion.  She does not smoke.  Past Medical History  Diagnosis Date  . Hypertension     Past Surgical History  Procedure Date  . Cholecystectomy     No family history on file.  History  Substance Use Topics  . Smoking status: Never Smoker   . Smokeless tobacco: Never Used  . Alcohol Use: No    OB History    Grav Para Term Preterm Abortions TAB SAB Ect Mult Living                  Review of Systems  Constitutional: Negative for fever and chills.  Respiratory: Negative for cough and shortness of breath.   Cardiovascular: Positive for leg swelling. Negative for chest pain and palpitations.  Gastrointestinal: Negative for nausea, vomiting and abdominal pain.  Genitourinary: Negative for dysuria.  Musculoskeletal: Negative for back pain.  Skin: Negative for rash.  Neurological: Negative for headaches.  Hematological: Does not bruise/bleed easily.  Psychiatric/Behavioral: Negative for confusion.  All other systems reviewed and are negative.    Allergies  Review of patient's allergies indicates no known allergies.  Home Medications   Current Outpatient Rx  Name Route Sig Dispense Refill  . LISINOPRIL-HYDROCHLOROTHIAZIDE 20-25 MG PO TABS Oral Take 1 tablet by mouth daily.    . FUROSEMIDE 20 MG PO TABS Oral Take 1  tablet (20 mg total) by mouth 2 (two) times daily. 30 tablet 0  . FUROSEMIDE 20 MG PO TABS Oral Take 1 tablet (20 mg total) by mouth 2 (two) times daily. 30 tablet 0    BP 121/72  Pulse 77  Temp 97.9 F (36.6 C) (Oral)  Resp 18  SpO2 100%  LMP 10/15/2011  Physical Exam  Nursing note and vitals reviewed. Constitutional: She is oriented to person, place, and time. She appears well-developed and well-nourished. No distress.  HENT:  Head: Normocephalic and atraumatic.  Eyes: Conjunctivae are normal.  Neck: Normal range of motion. Neck supple. No JVD present.  Cardiovascular: Normal rate.   No murmur heard. Pulmonary/Chest: Effort normal. No respiratory distress. She has no rales.  Abdominal: Soft. She exhibits no distension and no mass. There is no tenderness.  Musculoskeletal: Normal range of motion. She exhibits edema. She exhibits no tenderness.  Neurological: She is alert and oriented to person, place, and time.  Skin: Skin is warm and dry. No rash noted. No erythema.  Psychiatric: She has a normal mood and affect. Thought content normal.    ED Course  Procedures (including critical care time) peripheral edema in morbidly obese, female, with no indications of heart failure, or renal disease, or liver disease.  Also, no evidence of thyroid disease.  There is no evidence of skin discoloration.  No evidence of pulmonary embolism.  No evidence of cellulitis.  Most likely vascular insufficiency   Labs Reviewed  CBC  WITH DIFFERENTIAL - Abnormal; Notable for the following:    Hemoglobin 11.9 (*)     HCT 34.9 (*)     All other components within normal limits  COMPREHENSIVE METABOLIC PANEL - Abnormal; Notable for the following:    Sodium 132 (*)     Potassium 3.4 (*)     Glucose, Bld 119 (*)     GFR calc non Af Amer 75 (*)     GFR calc Af Amer 86 (*)     All other components within normal limits  PRO B NATRIURETIC PEPTIDE   Dg Chest 2 View  01/08/2012  *RADIOLOGY REPORT*   Clinical Data: Shortness of breath.  CHEST - 2 VIEW  Comparison: 05/24/2009.  Findings: The cardiac silhouette, mediastinal and hilar contours are within normal limits and stable.  The lungs are clear.  No pleural effusion.  The bony thorax is intact.  IMPRESSION: No acute cardiopulmonary findings.  Original Report Authenticated By: P. Loralie Champagne, M.D.     1. Bilateral leg edema   2. Hyponatremia   3. Hypokalemia   4. Hyperglycemia      Rate: 96  Rhythm: normal sinus rhythm  QRS Axis: normal  Intervals: normal  ST/T Wave abnormalities: normal  Conduction Disutrbances: none  Narrative Interpretation: unremarkable      MDM  Bilateral lower extremity, swelling, consistent with valvular insufficiency.  No evidence of congestive heart failure, liver, or renal disease.  No evidence of infection.  The patient is in no distress.  No evidence of pulmonary wasn't.        Cheri Guppy, MD 01/08/12 1929  Cheri Guppy, MD 01/22/12 4098

## 2012-01-08 NOTE — ED Notes (Signed)
reprots sob earlier and dizzines now

## 2012-02-15 ENCOUNTER — Emergency Department (INDEPENDENT_AMBULATORY_CARE_PROVIDER_SITE_OTHER)
Admission: EM | Admit: 2012-02-15 | Discharge: 2012-02-15 | Disposition: A | Discharge status: Home / Self Care | Payer: PRIVATE HEALTH INSURANCE | Source: Home / Self Care | Attending: Emergency Medicine | Admitting: Emergency Medicine

## 2012-02-15 ENCOUNTER — Encounter (HOSPITAL_COMMUNITY): Payer: Self-pay

## 2012-02-15 DIAGNOSIS — H109 Unspecified conjunctivitis: Secondary | ICD-10-CM

## 2012-02-15 MED ORDER — TOBRAMYCIN 0.3 % OP SOLN: 1.0000 [drp] | OPHTHALMIC | Status: DC

## 2012-02-15 MED ORDER — TOBRAMYCIN 0.3 % OP SOLN: 1.0000 [drp] | OPHTHALMIC | Status: AC

## 2012-02-15 MED ORDER — TETRACAINE HCL 0.5 % OP SOLN
OPHTHALMIC | Status: AC
  Filled 2012-02-15: qty 2

## 2012-02-15 NOTE — ED Notes (Signed)
States she began having soreness under lt eye since yesterday.  Reports when she awakened today lt eye was swollen and sclera red.  Denies drainage.  States she was sent to be checked by her employer.

## 2012-02-15 NOTE — ED Provider Notes (Signed)
History     CSN: [id number]  Arrival date & time 02/15/12  0803   None     No chief complaint on file.   (Consider location/radiation/quality/duration/timing/severity/associated sxs/prior treatment) Patient is a 46 y.o. female presenting with conjunctivitis. The history is provided by the patient. No language interpreter was used.  Conjunctivitis  The current episode started today. The problem occurs frequently. The problem has been unchanged. The problem is moderate. Nothing relieves the symptoms. Nothing aggravates the symptoms. Associated symptoms include eye itching and eye redness.  Pt reports a coworker had pink eye.  Pt now has eye drainage  Past Medical History  Diagnosis Date  . Hypertension     Past Surgical History  Procedure Date  . Cholecystectomy     No family history on file.  History  Substance Use Topics  . Smoking status: Never Smoker   . Smokeless tobacco: Never Used  . Alcohol Use: No    OB History    Grav Para Term Preterm Abortions TAB SAB Ect Mult Living                  Review of Systems  Eyes: Positive for redness and itching.  All other systems reviewed and are negative.    Allergies  Review of patient's allergies indicates no known allergies.  Home Medications   Current Outpatient Rx  Name Route Sig Dispense Refill  . FUROSEMIDE 20 MG PO TABS Oral Take 1 tablet (20 mg total) by mouth 2 (two) times daily. 30 tablet 0  . FUROSEMIDE 20 MG PO TABS Oral Take 1 tablet (20 mg total) by mouth 2 (two) times daily. 30 tablet 0  . LISINOPRIL-HYDROCHLOROTHIAZIDE 20-25 MG PO TABS Oral Take 1 tablet by mouth daily.      BP 137/77  Pulse 78  Temp 98.2 F (36.8 C) (Oral)  Resp 18  SpO2 98%  Physical Exam  Nursing note and vitals reviewed. Constitutional: She appears well-developed and well-nourished.  HENT:  Head: Normocephalic and atraumatic.  Eyes: Conjunctivae and EOM are normal. Pupils are equal, round, and reactive to light.     Injected, exudative   Neck: Normal range of motion. Neck supple.  Skin: Skin is warm.    ED Course  Procedures (including critical care time)  Labs Reviewed - No data to display No results found.   1. Conjunctivitis       MDM  tobrex        Elson Areas, PA 02/15/12 0901  Lonia Skinner [place], [name] 02/15/12 [phone]

## 2012-02-17 NOTE — ED Provider Notes (Signed)
Medical screening examination/treatment/procedure(s) were performed by non-physician practitioner and as supervising physician I was immediately available for consultation/collaboration.  Luiz Blare MD   Luiz Blare, MD 02/17/12 2135

## 2012-02-19 ENCOUNTER — Emergency Department (HOSPITAL_COMMUNITY)
Admission: EM | Admit: 2012-02-19 | Discharge: 2012-02-19 | Disposition: A | Discharge status: Home / Self Care | Payer: PRIVATE HEALTH INSURANCE | Attending: Emergency Medicine | Admitting: Emergency Medicine

## 2012-02-19 ENCOUNTER — Encounter (HOSPITAL_COMMUNITY): Payer: Self-pay | Admitting: *Deleted

## 2012-02-19 DIAGNOSIS — H0019 Chalazion unspecified eye, unspecified eyelid: Secondary | ICD-10-CM | POA: Insufficient documentation

## 2012-02-19 DIAGNOSIS — H0012 Chalazion right lower eyelid: Secondary | ICD-10-CM

## 2012-02-19 DIAGNOSIS — I10 Essential (primary) hypertension: Secondary | ICD-10-CM | POA: Insufficient documentation

## 2012-02-19 MED ORDER — TRAMADOL HCL 50 MG PO TABS
50.0000 mg | ORAL_TABLET | Freq: Once | ORAL | Status: AC
  Administered 2012-02-19: 50 mg via ORAL
  Filled 2012-02-19: qty 1

## 2012-02-19 MED ORDER — TRAMADOL HCL 50 MG PO TABS: 50.0000 mg | ORAL_TABLET | Freq: Once | ORAL | Status: AC

## 2012-02-19 NOTE — ED Notes (Signed)
Pt with redness, itching and soreness to L eye since Tuesday.  She went to urgent care and was dx with conjunctivitis.  She has been taking the medication they prescribed.  The redness diminished but her lower lid is now swelling.

## 2012-02-19 NOTE — ED Provider Notes (Signed)
Medical screening examination/treatment/procedure(s) were performed by non-physician practitioner and as supervising physician I was immediately available for consultation/collaboration.  Flint Melter, MD 02/19/12 [phone]

## 2012-02-19 NOTE — ED Provider Notes (Signed)
History     CSN: [id number]  Arrival date & time 02/19/12  0136   First MD Initiated Contact with Patient 02/19/12 0217      Chief Complaint  Patient presents with  . Conjunctivitis    (Consider location/radiation/quality/duration/timing/severity/associated sxs/prior treatment) HPI Comments: Was seen at [facility] for watering and painful eye 3 days ago started on antibiotic ointment for conjuntivitis Has gotten worse over the 3 days now with lower lid swelling No discharge/drainage no scleral redness  Patient is a 46 y.o. female presenting with conjunctivitis. The history is provided by the patient.  Conjunctivitis  The current episode started 3 to 5 days ago. The problem occurs continuously. The problem has been gradually worsening. The problem is moderate. Nothing relieves the symptoms. Associated symptoms include eye pain. Pertinent negatives include no fever, no eye itching, no nausea, no vomiting, no rhinorrhea and no eye discharge.    Past Medical History  Diagnosis Date  . Hypertension     Past Surgical History  Procedure Date  . Cholecystectomy     No family history on file.  History  Substance Use Topics  . Smoking status: Never Smoker   . Smokeless tobacco: Never Used  . Alcohol Use: No    OB History    Grav Para Term Preterm Abortions TAB SAB Ect Mult Living                  Review of Systems  Constitutional: Negative for fever.  HENT: Negative for rhinorrhea.   Eyes: Positive for pain. Negative for discharge, itching and visual disturbance.  Gastrointestinal: Negative for nausea and vomiting.  Skin: Negative for wound.  Neurological: Negative for dizziness.    Allergies  Review of patient's allergies indicates no known allergies.  Home Medications   Current Outpatient Rx  Name Route Sig Dispense Refill  . FUROSEMIDE 20 MG PO TABS Oral Take 1 tablet (20 mg total) by mouth 2 (two) times daily. 30 tablet 0  . LISINOPRIL-HYDROCHLOROTHIAZIDE 20-25 MG PO  TABS Oral Take 1 tablet by mouth daily.    . TOBRAMYCIN SULFATE 0.3 % OP SOLN Left Eye Place 1 drop into the left eye every 4 (four) hours. 5 mL 0  . TRAMADOL HCL 50 MG PO TABS Oral Take 1 tablet (50 mg total) by mouth once. 30 tablet 0    BP 141/82  Pulse 84  Temp 98.5 F (36.9 C) (Oral)  Resp 18  SpO2 100%  LMP 01/30/2012  Physical Exam  Constitutional: She is oriented to person, place, and time. She appears well-developed and well-nourished.  HENT:  Head: Normocephalic.  Eyes: Pupils are equal, round, and reactive to light. Right eye exhibits no discharge, no exudate and no hordeolum. No foreign body present in the right eye. Right conjunctiva is not injected.       chalazion lower lid center with small amount of purulent drainage noted at center   Cardiovascular: Normal rate.   Pulmonary/Chest: Effort normal.  Musculoskeletal: Normal range of motion.  Neurological: She is alert and oriented to person, place, and time.  Skin: Skin is warm.    ED Course  Procedures (including critical care time)  Labs Reviewed - No data to display No results found.   1. Chalazion of right lower eyelid       MDM   Chalazion will continue antibiotic add pain control and heat compressed with [facility] follow up          Arman Filter, NP 02/19/12  1914  Arman Filter, NP 02/19/12 [phone]

## 2012-04-13 ENCOUNTER — Emergency Department (INDEPENDENT_AMBULATORY_CARE_PROVIDER_SITE_OTHER)
Admission: EM | Admit: 2012-04-13 | Discharge: 2012-04-13 | Disposition: A | Discharge status: Home / Self Care | Payer: PRIVATE HEALTH INSURANCE | Source: Home / Self Care | Attending: Emergency Medicine | Admitting: Emergency Medicine

## 2012-04-13 ENCOUNTER — Emergency Department (INDEPENDENT_AMBULATORY_CARE_PROVIDER_SITE_OTHER): Payer: PRIVATE HEALTH INSURANCE

## 2012-04-13 ENCOUNTER — Encounter (HOSPITAL_COMMUNITY): Payer: Self-pay | Admitting: Emergency Medicine

## 2012-04-13 DIAGNOSIS — J111 Influenza due to unidentified influenza virus with other respiratory manifestations: Secondary | ICD-10-CM

## 2012-04-13 DIAGNOSIS — J209 Acute bronchitis, unspecified: Secondary | ICD-10-CM

## 2012-04-13 HISTORY — DX: Type 2 diabetes mellitus without complications: E11.9

## 2012-04-13 MED ORDER — HYDROCOD POLST-CHLORPHEN POLST 10-8 MG/5ML PO LQCR: 5.0000 mL | Freq: Two times a day (BID) | ORAL | Status: DC | PRN

## 2012-04-13 MED ORDER — ONDANSETRON HCL 4 MG/2ML IJ SOLN
INTRAMUSCULAR | Status: AC
  Filled 2012-04-13: qty 2

## 2012-04-13 MED ORDER — AZITHROMYCIN 250 MG PO TABS: ORAL_TABLET | ORAL | Status: DC

## 2012-04-13 MED ORDER — ONDANSETRON HCL 4 MG/2ML IJ SOLN
4.0000 mg | Freq: Once | INTRAMUSCULAR | Status: AC
  Administered 2012-04-13: 4 mg via INTRAMUSCULAR

## 2012-04-13 MED ORDER — HYDROCODONE-ACETAMINOPHEN 5-325 MG PO TABS
1.0000 | ORAL_TABLET | Freq: Once | ORAL | Status: AC
  Administered 2012-04-13: 1 via ORAL

## 2012-04-13 MED ORDER — HYDROCODONE-ACETAMINOPHEN 5-325 MG PO TABS
ORAL_TABLET | ORAL | Status: AC
  Filled 2012-04-13: qty 1

## 2012-04-13 MED ORDER — ONDANSETRON 8 MG PO TBDP: 8.0000 mg | ORAL_TABLET | Freq: Three times a day (TID) | ORAL | Status: DC | PRN

## 2012-04-13 MED ORDER — ONDANSETRON 4 MG PO TBDP: 8.0000 mg | ORAL_TABLET | Freq: Once | ORAL | Status: DC

## 2012-04-13 MED ORDER — HYDROCODONE-ACETAMINOPHEN 5-325 MG PO TABS: ORAL_TABLET | ORAL | Status: DC

## 2012-04-13 NOTE — ED Provider Notes (Signed)
Chief Complaint  Patient presents with  . URI    non productive cough n/v. 2 vomiting episodes on tuesday. HA and increase BP, pt did not take BP meds today. body aches, chills    History of Present Illness:   The patient is a 46 year old female who presents with a four-day history of generalized aches, headache, cough productive of small amounts of yellow-green sputum with blood, chest tightness, and aching in her ribs when she coughs. She's also had chilled but not had a fever and had sore throat and nausea and vomiting. She denies nasal congestion or rhinorrhea. She has had a severe headache. She denies abdominal pain. She takes lisinopril/HCTZ for blood pressure and also furosemide for fluid retention.  Review of Systems:  Other than noted above, the patient denies any of the following symptoms. Systemic:  No fever, chills, sweats, fatigue, myalgias, headache, or anorexia. Eye:  No redness, pain or drainage. ENT:  No earache, ear congestion, nasal congestion, sneezing, rhinorrhea, sinus pressure, sinus pain, post nasal drip, or sore throat. Lungs:  No cough, sputum production, wheezing, shortness of breath, or chest pain. GI:  No abdominal pain, nausea, vomiting, or diarrhea.  PMFSH:  Past medical history, family history, social history, meds, and allergies were reviewed.  Physical Exam:   Vital signs:  BP 175/91  Pulse 74  Temp 98.3 F (36.8 C) (Oral)  Resp 22  SpO2 100%  LMP 03/29/2012 General:  Alert, in no distress. Eye:  No conjunctival injection or drainage. Lids were normal. ENT:  TMs and canals were normal, without erythema or inflammation.  Nasal mucosa was clear and uncongested, without drainage.  Mucous membranes were moist.  Pharynx was clear, without exudate or drainage.  There were no oral ulcerations or lesions. Neck:  Supple, no adenopathy, tenderness or mass. Lungs:  No respiratory distress.  Lungs were clear to auscultation, without wheezes, rales or rhonchi.   Breath sounds were clear and equal bilaterally.  Heart:  Regular rhythm, without gallops, murmers or rubs. Skin:  Clear, warm, and dry, without rash or lesions.  Radiology:  Dg Chest 2 View  04/13/2012  *RADIOLOGY REPORT*  Clinical Data: Cough with a mild to assess,  fever  CHEST - 2 VIEW  Comparison: Chest x-ray of 01/08/2012  Findings: The lungs are clear.  The heart is borderline enlarged and stable.  No bony abnormality is seen.  IMPRESSION: Stable chest x-ray.  No active lung disease.   Original Report Authenticated By: Dwyane Dee, M.D.    I reviewed the images independently and personally and concur with the radiologist's findings.  Course in Urgent Care Center:   The patient was given hydrocodone/APAP 5/325 as a single oral dose for the headache. She had some nausea and vomiting thereafter. She was given Zofran 4 mg IM. She tolerated this well without any immediate side effects.  Assessment:  The primary encounter diagnosis was Influenza-like illness. A diagnosis of Acute bronchitis was also pertinent to this visit.  Plan:   1.  The following meds were prescribed:   New Prescriptions   AZITHROMYCIN (ZITHROMAX Z-PAK) 250 MG TABLET    Take as directed.   CHLORPHENIRAMINE-HYDROCODONE (TUSSIONEX) 10-8 MG/5ML LQCR    Take 5 mLs by mouth every 12 (twelve) hours as needed.   HYDROCODONE-ACETAMINOPHEN (NORCO/VICODIN) 5-325 MG PER TABLET    1 to 2 tabs every 4 to 6 hours as needed for pain.   ONDANSETRON (ZOFRAN ODT) 8 MG DISINTEGRATING TABLET    Take 1 tablet (  8 mg total) by mouth every 8 (eight) hours as needed for nausea.   2.  The patient was instructed in symptomatic care and handouts were given. 3.  The patient was told to return if becoming worse in any way, if no better in 3 or 4 days, and given some red flag symptoms that would indicate earlier return.   Reuben Likes, MD 04/13/12 [phone]

## 2012-04-13 NOTE — ED Notes (Signed)
Pt c/o cold symptoms since 10/29.   C/O HA   Increase in bp  but did not take bp meds today.  Dry cough with chest congestion and rib pain from coughing. Body aches and chills.  Pt states she had two vomiting episodes on Tuesday none since. Some feelings of nausea.  Denies diarrhea and any other symptoms.

## 2012-05-09 ENCOUNTER — Other Ambulatory Visit: Payer: Self-pay | Admitting: Family Medicine

## 2012-05-09 DIAGNOSIS — Z1231 Encounter for screening mammogram for malignant neoplasm of breast: Secondary | ICD-10-CM

## 2012-05-29 ENCOUNTER — Ambulatory Visit: Payer: PRIVATE HEALTH INSURANCE | Admitting: Obstetrics and Gynecology

## 2012-05-30 ENCOUNTER — Ambulatory Visit (HOSPITAL_COMMUNITY): Payer: PRIVATE HEALTH INSURANCE

## 2012-06-19 ENCOUNTER — Encounter (HOSPITAL_COMMUNITY): Payer: Self-pay | Admitting: Emergency Medicine

## 2012-06-19 ENCOUNTER — Emergency Department (HOSPITAL_COMMUNITY)
Admission: EM | Admit: 2012-06-19 | Discharge: 2012-06-19 | Disposition: A | Discharge status: Home Health | Payer: PRIVATE HEALTH INSURANCE | Attending: Emergency Medicine | Admitting: Emergency Medicine

## 2012-06-19 DIAGNOSIS — K219 Gastro-esophageal reflux disease without esophagitis: Secondary | ICD-10-CM

## 2012-06-19 DIAGNOSIS — R0789 Other chest pain: Secondary | ICD-10-CM | POA: Insufficient documentation

## 2012-06-19 DIAGNOSIS — R079 Chest pain, unspecified: Secondary | ICD-10-CM

## 2012-06-19 DIAGNOSIS — I10 Essential (primary) hypertension: Secondary | ICD-10-CM | POA: Insufficient documentation

## 2012-06-19 DIAGNOSIS — R0602 Shortness of breath: Secondary | ICD-10-CM | POA: Insufficient documentation

## 2012-06-19 DIAGNOSIS — Z79899 Other long term (current) drug therapy: Secondary | ICD-10-CM | POA: Insufficient documentation

## 2012-06-19 DIAGNOSIS — E119 Type 2 diabetes mellitus without complications: Secondary | ICD-10-CM | POA: Insufficient documentation

## 2012-06-19 MED ORDER — PANTOPRAZOLE SODIUM 40 MG PO TBEC: 40.0000 mg | DELAYED_RELEASE_TABLET | Freq: Every day | ORAL | Status: DC

## 2012-06-19 MED ORDER — GI COCKTAIL ~~LOC~~
30.0000 mL | Freq: Once | ORAL | Status: AC
  Administered 2012-06-19: 30 mL via ORAL
  Filled 2012-06-19: qty 30

## 2012-06-19 NOTE — ED Notes (Signed)
Pt c/o midsternal chest pressure; pt sts htn this am also; pt sts worse with movement; pt sts some improvement with tums; pt denies SOB

## 2012-06-19 NOTE — ED Provider Notes (Signed)
History     CSN: [id number]  Arrival date & time 06/19/12  0818   First MD Initiated Contact with Patient 06/19/12 0827      Chief Complaint  Patient presents with  . Chest Pain  . Hypertension    (Consider location/radiation/quality/duration/timing/severity/associated sxs/prior treatment) HPI Comments: Patient reports that she was in her usual state of health and had gone in to do her work this morning. 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 She has significant history of hypertension and diet-controlled diabetes. She was recently started on Zestoretic. She reports that she had not yet taken her medication. 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 It is associated with minimal shortness of breath, no sweats no nausea or vomiting and denied feeling syncopal or dizzy. She denies any recent URI symptoms. She denies any abdominal pain or back pain. Pain was described as a tightness. She spoke to a nurse where she works who took her blood pressure and was found to be over 180 systolic. Patient then took her blood pressure medication and also some Tums and she reports now the pain is down from a 10 out of 10 to about a 5/10.SHe reports that she feels much improved. She denies smoking history, does have a strong family history with 2 brothers who have had massive heart attacks while young. She denies any recent exertional shortness of breath or chest pain or tightness. She denies any long distance travel, lower extremity or calf pain or swelling. She denies pleurisy. She reports she does not usually get heartburn or reflux.  Patient is a 47 y.o. female presenting with chest pain and hypertension. The history is provided by the patient.  Chest Pain The chest pain began 1 - 2 hours ago. Primary symptoms include shortness of breath. Pertinent negatives for primary  symptoms include no fever, no cough, no palpitations, no abdominal pain, no nausea and no dizziness.  Her past medical history is significant for hypertension.    Hypertension Associated symptoms include chest pain and shortness of breath. Pertinent negatives include no abdominal pain.    Past Medical History  Diagnosis Date  . Hypertension   . Diabetes mellitus without complication     Past Surgical History  Procedure Date  . Cholecystectomy     History reviewed. No pertinent family history.  History  Substance Use Topics  . Smoking status: Never Smoker   . Smokeless tobacco: Never Used  . Alcohol Use: Yes     Comment: occ    OB History    Grav Para Term Preterm Abortions TAB SAB Ect Mult Living                  Review of Systems  Constitutional: Negative for fever.  HENT: Negative for congestion and rhinorrhea.   Respiratory: Positive for shortness of breath. Negative for cough.   Cardiovascular: Positive for chest pain. Negative for palpitations and leg swelling.  Gastrointestinal: Negative for nausea and abdominal pain.  Musculoskeletal: Negative for back pain.  Neurological: Negative for dizziness, syncope and light-headedness.  Psychiatric/Behavioral: The patient is not nervous/anxious.   All other systems reviewed and are negative.    Allergies  Review of patient's allergies indicates no known allergies.  Home Medications   Current Outpatient Rx  Name  Route  Sig  Dispense  Refill  . LISINOPRIL-HYDROCHLOROTHIAZIDE 20-25 MG  PO TABS   Oral   Take 1 tablet by mouth daily.           BP 163/81  Pulse 87  Temp 98.2 F (36.8 C) (Oral)  Resp 18  SpO2 98%  Physical Exam  Nursing note and vitals reviewed. Constitutional: She is oriented to person, place, and time. She appears well-developed and well-nourished.  HENT:  Head: Normocephalic and atraumatic.  Eyes: Pupils are equal, round, and reactive to light. No scleral icterus.  Neck: Normal  range of motion. Neck supple.  Cardiovascular: Normal rate and regular rhythm.   Pulmonary/Chest: Effort normal and breath sounds normal. No respiratory distress.  Abdominal: Soft. She exhibits no distension. There is no tenderness. There is no rebound and no guarding.  Neurological: She is alert and oriented to person, place, and time. No cranial nerve deficit. Coordination normal.  Skin: Skin is warm and dry.  Psychiatric: She has a normal mood and affect.    ED Course  Procedures (including critical care time)  Labs Reviewed - No data to display No results found.   1. GERD (gastroesophageal reflux disease)   2. Chest pain     EKG performed at time 08:27 shows normal sinus rhythm at a rate of 74. Axis is normal. There is poor R-wave progression between leads V2 and V3. Nonspecific T wave flattening in inferior and lateral leads. This is minimally changed from EKG on 01/08/2012.  Room air saturation is 98% and I interpret this to be normal.  9:54 AM After GI cocktail, patient had  Resolution of her chest pain. I discussed at length the fact that she does have risk factors for coronary symptoms that she should return if she has further episodes especially if associated with dizziness, sweating, shortness of breath since GERD presence doesn't exclude the small possibility of CAD. Patient voices understanding. She does have a primary care nurse practitioner, follow her I will give her referrals to other family physicians as she has requested.    MDM   Patient with somewhat atypical discomfort in her chest that seems improved after some time as well as taking Tums. She is a nonsmoker but does have some significant risk factors for coronary disease. She denies any recent exertional symptoms. ECG is nonspecific for ischemic changes, is very similar to an EKG performed 6 months ago. Will try a GI cocktail but if no significant improvement, will perform a cardiac evaluation and likely would  benefit from stress testing if troponins are normal.        Gavin Pound. Jilene Spohr, MD 06/19/12 1001

## 2012-06-19 NOTE — Discharge Instructions (Signed)
 Gastroesophageal Reflux Disease, Adult Gastroesophageal reflux disease (GERD) happens when acid from your stomach flows up into the esophagus. When acid comes in contact with the esophagus, the acid causes soreness (inflammation) in the esophagus. Over time, GERD may create small holes (ulcers) in the lining of the esophagus. CAUSES   Increased body weight. This puts pressure on the stomach, making acid rise from the stomach into the esophagus.  Smoking. This increases acid production in the stomach.  Drinking alcohol. This causes decreased pressure in the lower esophageal sphincter (valve or ring of muscle between the esophagus and stomach), allowing acid from the stomach into the esophagus.  Late evening meals and a full stomach. This increases pressure and acid production in the stomach.  A malformed lower esophageal sphincter. Sometimes, no cause is found. SYMPTOMS   Burning pain in the lower part of the mid-chest behind the breastbone and in the mid-stomach area. This may occur twice a week or more often.  Trouble swallowing.  Sore throat.  Dry cough.  Asthma-like symptoms including chest tightness, shortness of breath, or wheezing. DIAGNOSIS  Your caregiver may be able to diagnose GERD based on your symptoms. In some cases, X-rays and other tests may be done to check for complications or to check the condition of your stomach and esophagus. TREATMENT  Your caregiver may recommend over-the-counter or prescription medicines to help decrease acid production. Ask your caregiver before starting or adding any new medicines.  HOME CARE INSTRUCTIONS   Change the factors that you can control. Ask your caregiver for guidance concerning weight loss, quitting smoking, and alcohol consumption.  Avoid foods and drinks that make your symptoms worse, such as:  Caffeine or alcoholic drinks.  Chocolate.  Peppermint or mint flavorings.  Garlic and onions.  Spicy foods.  Citrus fruits,  such as oranges, lemons, or limes.  Tomato-based foods such as sauce, chili, salsa, and pizza.  Fried and fatty foods.  Avoid lying down for the 3 hours prior to your bedtime or prior to taking a nap.  Eat small, frequent meals instead of large meals.  Wear loose-fitting clothing. Do not wear anything tight around your waist that causes pressure on your stomach.  Raise the head of your bed 6 to 8 inches with wood blocks to help you sleep. Extra pillows will not help.  Only take over-the-counter or prescription medicines for pain, discomfort, or fever as directed by your caregiver.  Do not take aspirin , ibuprofen , or other nonsteroidal anti-inflammatory drugs (NSAIDs). SEEK IMMEDIATE MEDICAL CARE IF:   You have pain in your arms, neck, jaw, teeth, or back.  Your pain increases or changes in intensity or duration.  You develop nausea, vomiting, or sweating (diaphoresis).  You develop shortness of breath, or you faint.  Your vomit is green, yellow, black, or looks like coffee grounds or blood.  Your stool is red, bloody, or black. These symptoms could be signs of other problems, such as heart disease, gastric bleeding, or esophageal bleeding. MAKE SURE YOU:   Understand these instructions.  Will watch your condition.  Will get help right away if you are not doing well or get worse. Document Released: 03/10/2005 Document Revised: 08/23/2011 Document Reviewed: 12/18/2010 [facility] Patient Information 2013 [place], [place].     Chest Pain (Nonspecific) It is often hard to give a specific diagnosis for the cause of chest pain. There is always a chance that your pain could be related to something serious, such as a heart attack or a blood clot  in the lungs. You need to follow up with your caregiver for further evaluation. CAUSES   Heartburn.  Pneumonia or bronchitis.  Anxiety or stress.  Inflammation around your heart (pericarditis) or lung (pleuritis or pleurisy).  A  blood clot in the lung.  A collapsed lung (pneumothorax). It can develop suddenly on its own (spontaneous pneumothorax) or from injury (trauma) to the chest.  Shingles infection (herpes zoster virus). The chest wall is composed of bones, muscles, and cartilage. Any of these can be the source of the pain.  The bones can be bruised by injury.  The muscles or cartilage can be strained by coughing or overwork.  The cartilage can be affected by inflammation and become sore (costochondritis). DIAGNOSIS  Lab tests or other studies, such as X-rays, electrocardiography, stress testing, or cardiac imaging, may be needed to find the cause of your pain.  TREATMENT   Treatment depends on what may be causing your chest pain. Treatment may include:  Acid blockers for heartburn.  Anti-inflammatory medicine.  Pain medicine for inflammatory conditions.  Antibiotics if an infection is present.  You may be advised to change lifestyle habits. This includes stopping smoking and avoiding alcohol, caffeine, and chocolate.  You may be advised to keep your head raised (elevated) when sleeping. This reduces the chance of acid going backward from your stomach into your esophagus.  Most of the time, nonspecific chest pain will improve within 2 to 3 days with rest and mild pain medicine. HOME CARE INSTRUCTIONS   If antibiotics were prescribed, take your antibiotics as directed. Finish them even if you start to feel better.  For the next few days, avoid physical activities that bring on chest pain. Continue physical activities as directed.  Do not smoke.  Avoid drinking alcohol.  Only take over-the-counter or prescription medicine for pain, discomfort, or fever as directed by your caregiver.  Follow your caregiver's suggestions for further testing if your chest pain does not go away.  Keep any follow-up appointments you made. If you do not go to an appointment, you could develop lasting (chronic)  problems with pain. If there is any problem keeping an appointment, you must call to reschedule. SEEK MEDICAL CARE IF:   You think you are having problems from the medicine you are taking. Read your medicine instructions carefully.  Your chest pain does not go away, even after treatment.  You develop a rash with blisters on your chest. SEEK IMMEDIATE MEDICAL CARE IF:   You have increased chest pain or pain that spreads to your arm, neck, jaw, back, or abdomen.  You develop shortness of breath, an increasing cough, or you are coughing up blood.  You have severe back or abdominal pain, feel nauseous, or vomit.  You develop severe weakness, fainting, or chills.  You have a fever. THIS IS AN EMERGENCY. Do not wait to see if the pain will go away. Get medical help at once. Call your local emergency services (911 in [place] Do not drive yourself to the hospital. MAKE SURE YOU:   Understand these instructions.  Will watch your condition.  Will get help right away if you are not doing well or get worse. Document Released: 03/10/2005 Document Revised: 08/23/2011 Document Reviewed: 01/04/2008 [facility] Patient Information 2013 [place], [place].

## 2012-09-17 ENCOUNTER — Emergency Department (HOSPITAL_COMMUNITY)
Admission: EM | Admit: 2012-09-17 | Discharge: 2012-09-18 | Disposition: A | Discharge status: Home / Self Care | Payer: PRIVATE HEALTH INSURANCE | Attending: Emergency Medicine | Admitting: Emergency Medicine

## 2012-09-17 ENCOUNTER — Encounter (HOSPITAL_COMMUNITY): Payer: Self-pay | Admitting: Emergency Medicine

## 2012-09-17 DIAGNOSIS — R209 Unspecified disturbances of skin sensation: Secondary | ICD-10-CM | POA: Insufficient documentation

## 2012-09-17 DIAGNOSIS — M542 Cervicalgia: Secondary | ICD-10-CM | POA: Insufficient documentation

## 2012-09-17 DIAGNOSIS — Z79899 Other long term (current) drug therapy: Secondary | ICD-10-CM | POA: Insufficient documentation

## 2012-09-17 DIAGNOSIS — R51 Headache: Secondary | ICD-10-CM | POA: Insufficient documentation

## 2012-09-17 DIAGNOSIS — T148XXA Other injury of unspecified body region, initial encounter: Secondary | ICD-10-CM | POA: Insufficient documentation

## 2012-09-17 DIAGNOSIS — Y939 Activity, unspecified: Secondary | ICD-10-CM | POA: Insufficient documentation

## 2012-09-17 DIAGNOSIS — Y929 Unspecified place or not applicable: Secondary | ICD-10-CM | POA: Insufficient documentation

## 2012-09-17 DIAGNOSIS — E119 Type 2 diabetes mellitus without complications: Secondary | ICD-10-CM | POA: Insufficient documentation

## 2012-09-17 DIAGNOSIS — I10 Essential (primary) hypertension: Secondary | ICD-10-CM | POA: Insufficient documentation

## 2012-09-17 DIAGNOSIS — X58XXXA Exposure to other specified factors, initial encounter: Secondary | ICD-10-CM | POA: Insufficient documentation

## 2012-09-17 MED ORDER — HYDROCODONE-ACETAMINOPHEN 5-325 MG PO TABS
1.0000 | ORAL_TABLET | Freq: Once | ORAL | Status: AC
  Administered 2012-09-17: 1 via ORAL
  Filled 2012-09-17: qty 1

## 2012-09-17 NOTE — ED Notes (Signed)
Pt st's she started having pain in top of her head with pain in left side of her body approx. 10pm tonight.  Pt denies any nausea or vomiting.  Pt alert and oriented x'3.  Pt denies any chest pain

## 2012-09-17 NOTE — ED Provider Notes (Signed)
History     CSN: [id number]  Arrival date & time 09/17/12  2257   First MD Initiated Contact with Patient 09/17/12 2330      Chief Complaint  Patient presents with  . Shoulder Pain  . Arm Pain    (Consider location/radiation/quality/duration/timing/severity/associated sxs/prior treatment) HPI Comments: Natasha Doyle is a 47 y.o. female who complains of abrupt onset of left trapezius pain radiating into her left arm with tingling in her left hand, one half hours ago. She has mild neck pain and headache as well. She denies trauma. She denies fever, chills, nausea, or vomiting. She had episode of tingling in her left hand several weeks ago; it resolved spontaneously. The discomfort in her left trapezius is worse with movement of her left shoulder. There are no other modifying factors.  Patient is a 47 y.o. female presenting with shoulder pain and arm pain. The history is provided by the patient.  Shoulder Pain  Arm Pain    Past Medical History  Diagnosis Date  . Hypertension   . Diabetes mellitus without complication     Past Surgical History  Procedure Laterality Date  . Cholecystectomy      No family history on file.  History  Substance Use Topics  . Smoking status: Never Smoker   . Smokeless tobacco: Never Used  . Alcohol Use: Yes     Comment: occ    OB History   Grav Para Term Preterm Abortions TAB SAB Ect Mult Living                  Review of Systems  All other systems reviewed and are negative.    Allergies  Review of patient's allergies indicates no known allergies.  Home Medications   Current Outpatient Rx  Name  Route  Sig  Dispense  Refill  . lisinopril-hydrochlorothiazide (PRINZIDE,ZESTORETIC) 20-25 MG per tablet   Oral   Take 1 tablet by mouth daily.         Marland Kitchen HYDROcodone-acetaminophen (NORCO) 5-325 MG per tablet   Oral   Take 1 tablet by mouth every 4 (four) hours as needed for pain.   20 tablet   0     BP 173/80  Pulse 86   Temp(Src) 98.8 F (37.1 C) (Oral)  Resp 18  SpO2 99%  LMP 09/03/2012  Physical Exam  Nursing note and vitals reviewed. Constitutional: She is oriented to person, place, and time. She appears well-developed.  Obese  HENT:  Head: Normocephalic and atraumatic.  Eyes: Conjunctivae and EOM are normal. Pupils are equal, round, and reactive to light.  Neck: Normal range of motion and phonation normal. Neck supple.  Cardiovascular: Normal rate, regular rhythm and intact distal pulses.   Pulmonary/Chest: Effort normal and breath sounds normal. She exhibits no tenderness.  Abdominal: Soft. She exhibits no distension. There is no tenderness. There is no guarding.  Musculoskeletal: Normal range of motion. She exhibits no edema.  Tenderness left trapezius with palpable spasm. Normal active range of motion left shoulder left elbow and left wrist.   Neurological: She is alert and oriented to person, place, and time. She has normal strength. She exhibits normal muscle tone.  Very minimal dysesthesia, fingers 3,4,5 left hand.   Skin: Skin is warm and dry.  Psychiatric: She has a normal mood and affect. Her behavior is normal. Judgment and thought content normal.    ED Course  Procedures (including critical care time)   Medications  HYDROcodone-acetaminophen (NORCO/VICODIN) 5-325 MG per tablet 1  tablet (1 tablet Oral Given 09/17/12 2346)    Patient Vitals for the past 24 hrs:  BP Temp Temp src Pulse Resp SpO2  09/17/12 2301 173/80 mmHg 98.8 F (37.1 C) Oral 86 18 99 %      Labs Reviewed - No data to display Dg Cervical Spine Complete  09/18/2012  *RADIOLOGY REPORT*  Clinical Data: Left-sided neck pain, radiating to the left shoulder.  Tingling and numbness of the left fingers.  CERVICAL SPINE - COMPLETE 4+ VIEW  Comparison: Cervical spine radiographs performed 04/26/2010  Findings: There is no evidence of fracture or subluxation. Vertebral bodies demonstrate normal height and alignment.  Intervertebral disc spaces are preserved.  Prevertebral soft tissues are within normal limits.  The provided odontoid view demonstrates no significant abnormality.  The visualized lung apices are clear.  IMPRESSION: No evidence of fracture or subluxation along the cervical spine.   Original Report Authenticated By: Tonia Ghent, M.D.    Dg Shoulder Left  09/18/2012  *RADIOLOGY REPORT*  Clinical Data: Left neck and shoulder pain.  LEFT SHOULDER - 2+ VIEW  Comparison: Chest radiograph performed 04/13/2012  Findings: There is no evidence of fracture or dislocation.  The left humeral head is seated within the glenoid fossa.  The acromioclavicular joint is unremarkable in appearance.  No significant soft tissue abnormalities are seen.  The visualized portions of the left lung are clear.  IMPRESSION: No evidence of fracture or dislocation.   Original Report Authenticated By: Tonia Ghent, M.D.    Nursing Notes Reviewed/ Care Coordinated, and agree without changes. Applicable Imaging Reviewed.  Interpretation of Laboratory Data incorporated into ED treatment  1. Muscle strain       MDM  Nonspecific muscle strain. Doubt fracture, cervical radiculopathy, pneumonia, PE, or metabolic instability. She is stable for discharge    Plan: Home Medications- Norco; Home Treatments- rest, heat; Recommended follow up- PCP prn       Flint Melter, MD 09/18/12 [phone]

## 2012-09-17 NOTE — ED Notes (Signed)
PT. REPORTS LEFT SHOULDER / LEFT ARM PAIN WITH HEADACHE ONSET THIS EVENING , DENIES CHEST PAIN OR SOB.

## 2012-09-18 ENCOUNTER — Emergency Department (HOSPITAL_COMMUNITY): Payer: PRIVATE HEALTH INSURANCE

## 2012-09-18 MED ORDER — HYDROCODONE-ACETAMINOPHEN 5-325 MG PO TABS: 1.0000 | ORAL_TABLET | ORAL | Status: DC | PRN

## 2012-10-28 IMAGING — CR DG CHEST 2V
2 series · 2 of 2 positions shown · non-contrast
Comparison: 05/24/2009.

CLINICAL DATA: Shortness of breath.

CHEST - 2 VIEW

[w chest pa]
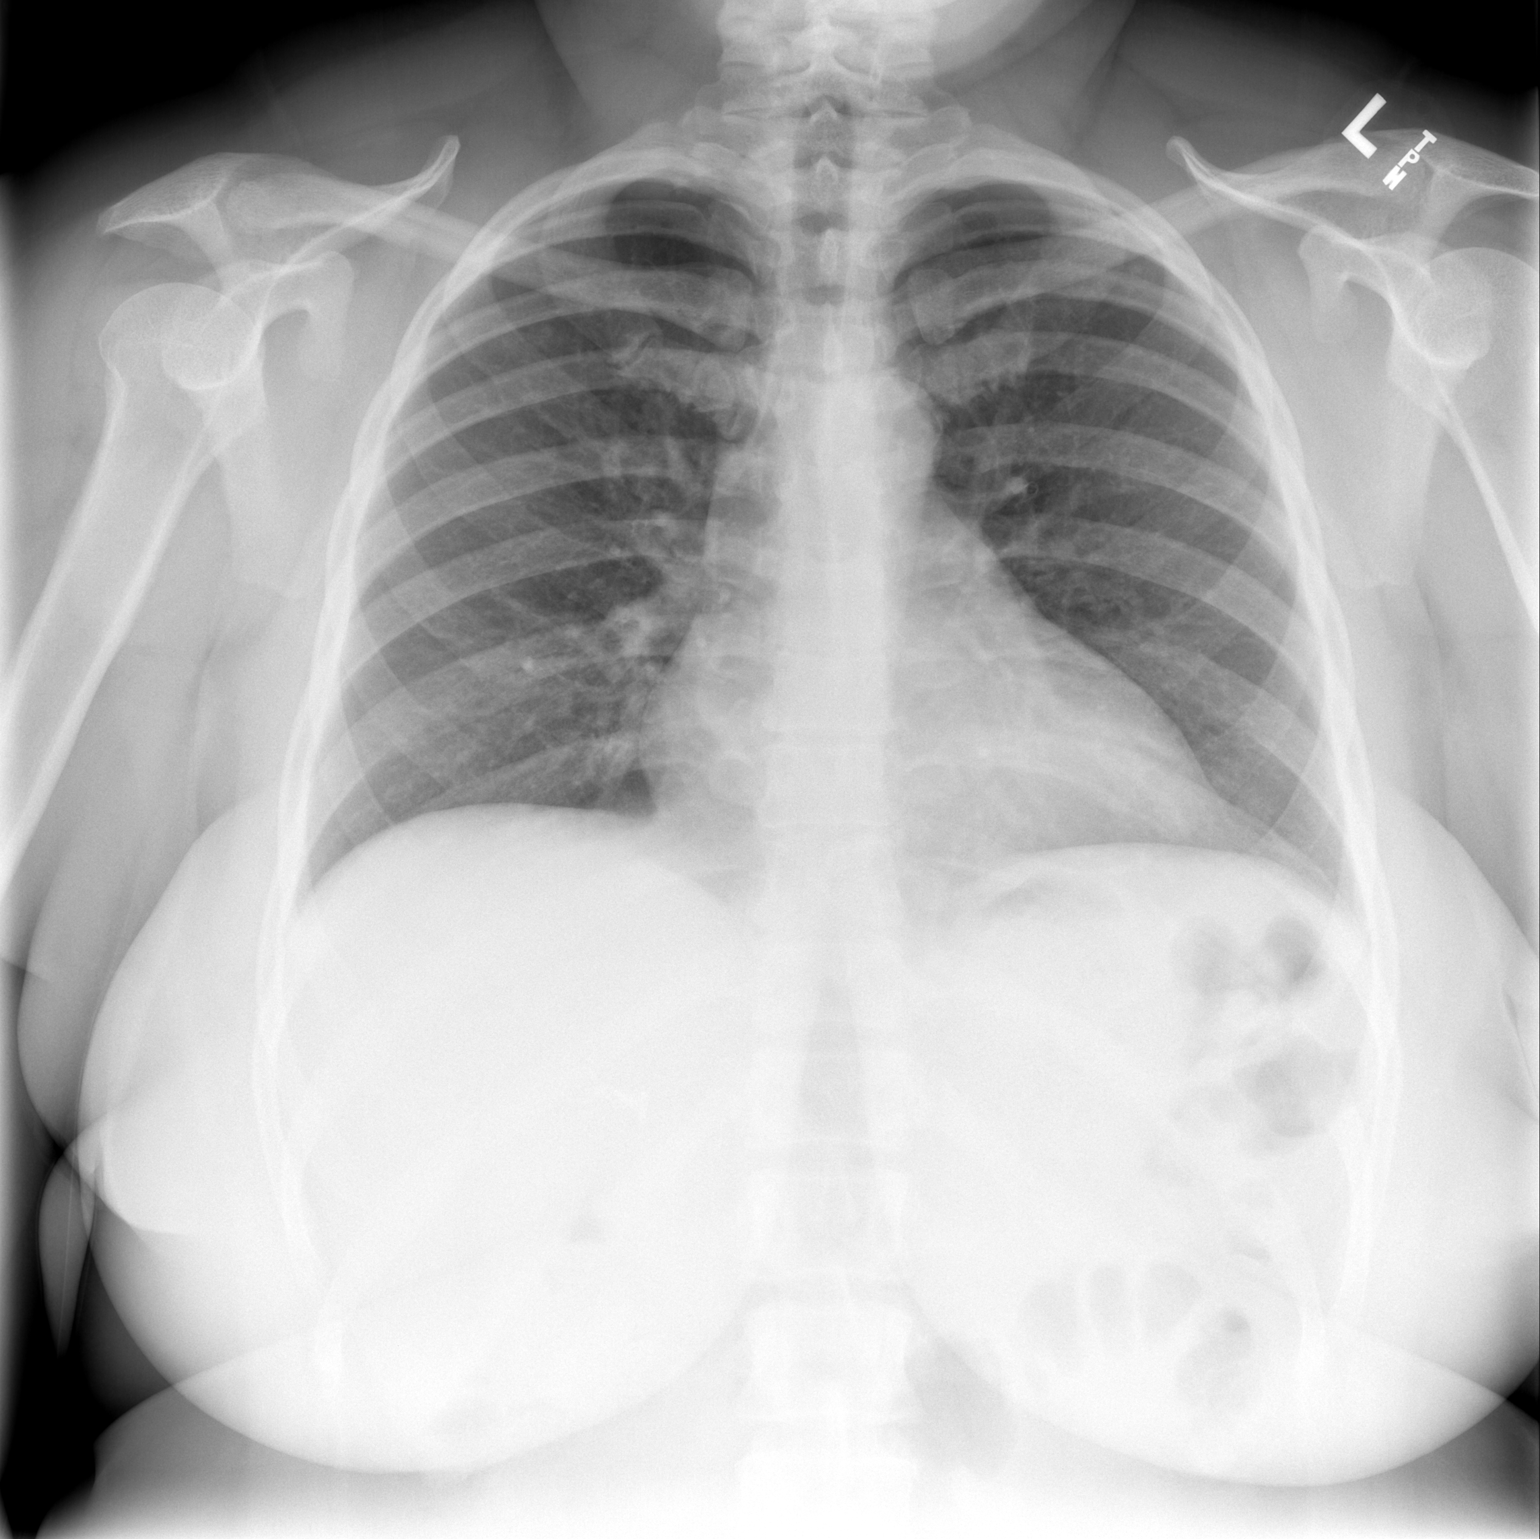

[w chest lat]
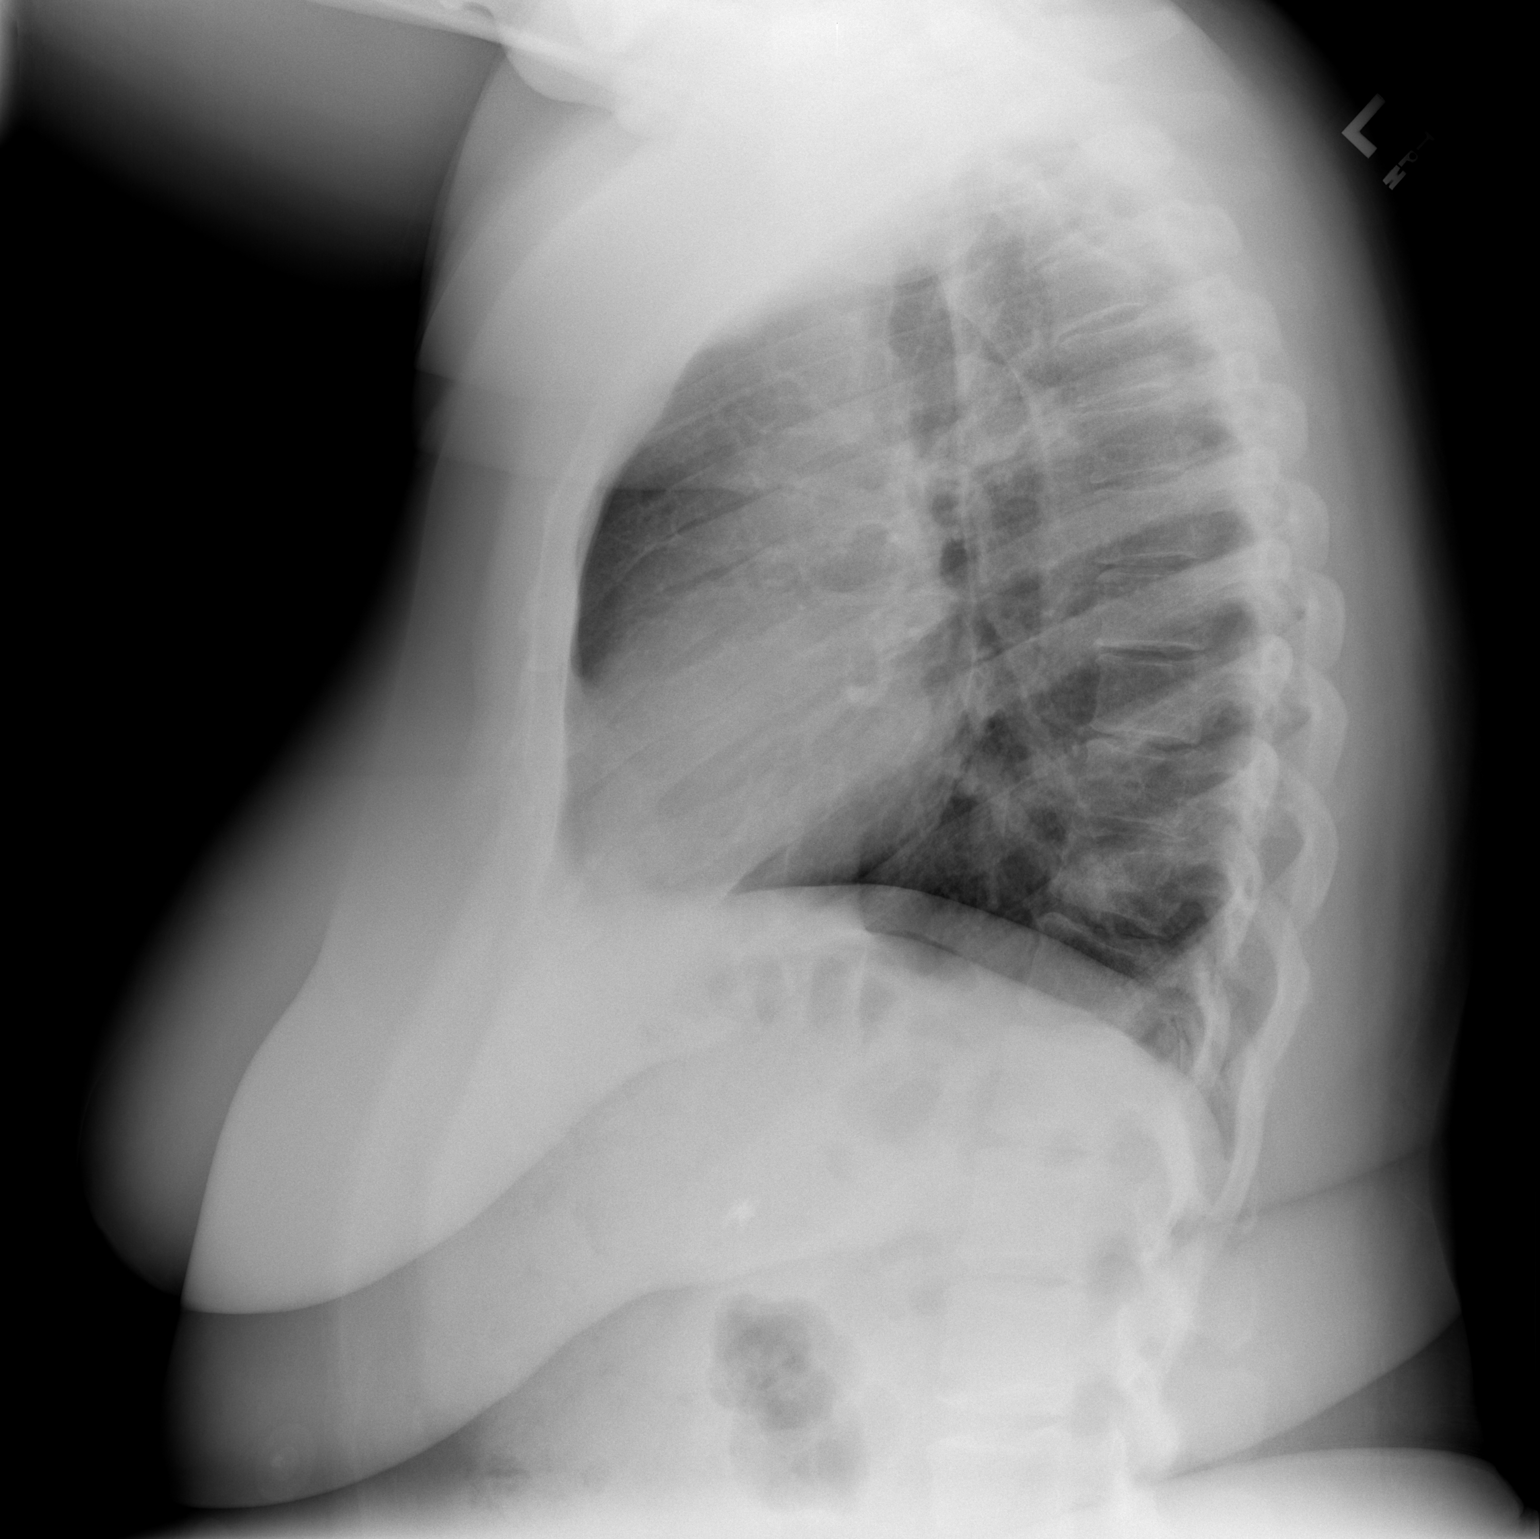

[2 of 2 positions shown; findings below may reference images not displayed]

FINDINGS: The cardiac silhouette, mediastinal and hilar contours
are within normal limits and stable.  The lungs are clear.  No
pleural effusion.  The bony thorax is intact.
IMPRESSION: No acute cardiopulmonary findings.

## 2012-12-25 ENCOUNTER — Encounter (HOSPITAL_COMMUNITY): Payer: Self-pay | Admitting: *Deleted

## 2012-12-25 ENCOUNTER — Emergency Department (HOSPITAL_COMMUNITY)
Admission: EM | Admit: 2012-12-25 | Discharge: 2012-12-25 | Disposition: A | Discharge status: Home / Self Care | Payer: Self-pay | Attending: Emergency Medicine | Admitting: Emergency Medicine

## 2012-12-25 DIAGNOSIS — Z79899 Other long term (current) drug therapy: Secondary | ICD-10-CM | POA: Insufficient documentation

## 2012-12-25 DIAGNOSIS — M25559 Pain in unspecified hip: Secondary | ICD-10-CM | POA: Insufficient documentation

## 2012-12-25 DIAGNOSIS — E119 Type 2 diabetes mellitus without complications: Secondary | ICD-10-CM | POA: Insufficient documentation

## 2012-12-25 DIAGNOSIS — I10 Essential (primary) hypertension: Secondary | ICD-10-CM | POA: Insufficient documentation

## 2012-12-25 DIAGNOSIS — M79651 Pain in right thigh: Secondary | ICD-10-CM

## 2012-12-25 MED ORDER — IBUPROFEN 600 MG PO TABS: 600.0000 mg | ORAL_TABLET | Freq: Four times a day (QID) | ORAL | Status: DC | PRN

## 2012-12-25 MED ORDER — HYDROCODONE-ACETAMINOPHEN 5-325 MG PO TABS: ORAL_TABLET | ORAL | Status: DC

## 2012-12-25 NOTE — ED Provider Notes (Signed)
History    This chart was scribed for non-physician practitioner Renne Crigler, PA-C, working with Gilda Crease, by Donne Anon, ED Scribe. This patient was seen in room TR10C/TR10C and the patient's care was started at 1856.  CSN: [id number] Arrival date & time 12/25/12  1821  First MD Initiated Contact with Patient 12/25/12 1856     Chief Complaint  Patient presents with  . Hip Pain    The history is provided by the patient. No language interpreter was used.   HPI Comments: Natasha Doyle is a 47 y.o. female who presents to the Emergency Department complaining of 3 days of gradual onset, gradually worsening right hip pain. Walking and laying on her right side makes the pain worse. She has tried ibuprofen with mild relief. She denies numbness or tingling, back pain, fever or any other pain. She denies a hx of DVT, recent surgery, medications with estrogen, or long trips. 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 She deneis any prior similar pain.  Past Medical History  Diagnosis Date  . Hypertension   . Diabetes mellitus without complication    Past Surgical History  Procedure Laterality Date  . Cholecystectomy     No family history on file. History  Substance Use Topics  . Smoking status: Never Smoker   . Smokeless tobacco: Never Used  . Alcohol Use: Yes     Comment: occ   OB History   Grav Para Term Preterm Abortions TAB SAB Ect Mult Living                 Review of Systems  Constitutional: Negative for fever and activity change.  HENT: Negative for neck pain.   Musculoskeletal: Positive for arthralgias. Negative for back pain and joint swelling.  Skin: Negative for wound.  Neurological: Negative for weakness and numbness.    Allergies  Review of patient's allergies indicates no known allergies.  Home Medications   Current Outpatient Rx  Name  Route  Sig  Dispense  Refill  . HYDROcodone-acetaminophen  (NORCO) 5-325 MG per tablet   Oral   Take 1 tablet by mouth every 4 (four) hours as needed for pain.   20 tablet   0   . lisinopril-hydrochlorothiazide (PRINZIDE,ZESTORETIC) 20-25 MG per tablet   Oral   Take 1 tablet by mouth daily.          BP 163/82  Pulse 83  Temp(Src) 98.2 F (36.8 C) (Oral)  Resp 18  SpO2 100%  Physical Exam  Nursing note and vitals reviewed. Constitutional: She appears well-developed and well-nourished. No distress.  HENT:  Head: Normocephalic and atraumatic.  Eyes: Conjunctivae are normal. Pupils are equal, round, and reactive to light.  Neck: Normal range of motion. Neck supple. No tracheal deviation present.  Cardiovascular: Normal rate.  Exam reveals no decreased pulses.   Pulmonary/Chest: Effort normal. No respiratory distress.  Musculoskeletal: Normal range of motion. She exhibits tenderness. She exhibits no edema.       Right hip: She exhibits tenderness.  Generalized tenderness to palpation of right thigh and hip. ROM normal. Antalgic gait.   Neurological: She is alert. No sensory deficit.  Motor, sensation, and vascular distal to the injury is fully intact.   Skin: Skin is warm and dry.  Psychiatric: She has a normal mood and affect. Her behavior is normal.    ED Course  Procedures (including critical care time) DIAGNOSTIC STUDIES:  Oxygen Saturation is 100% on RA, normal by my interpretation.    COORDINATION OF CARE: 7:24 PM Discussed treatment plan which includes ibuprofen, pain medication, heating pads and rest with pt at bedside and pt agreed to plan. Referral given.    Labs Reviewed - No data to display No results found. 1. Musculoskeletal pain of right thigh    Patient seen and examined.   Vital signs reviewed and are as follows: Filed Vitals:   12/25/12 1843  BP: 163/82  Pulse: 83  Temp: 98.2 F (36.8 C)  Resp: 18   Patient counseled on use of narcotic pain medications. Counseled not to combine these medications with  others containing tylenol. Urged not to drink alcohol, drive, or perform any other activities that requires focus while taking these medications. The patient verbalizes understanding and agrees with the plan.  Patient was counseled on RICE protocol and told to rest injury, use ice for no longer than 15 minutes every hour, compress the area, and elevate above the level of their heart as much as possible to reduce swelling.  Questions answered.  Patient verbalized understanding.    MDM  Patient with positional, musculoskeletal pain of thigh -- worse with movement. Inflammation suspected. Pt ambulatory. Do not suspect PE. No risk factors. LE is neurovascularly intact. RICE, pain control indicated with ortho f/u if not improving. Referral given.   I personally performed the services described in this documentation, which was scribed in my presence. The recorded information has been reviewed and is accurate.     Renne Crigler, PA-C 12/25/12 2008

## 2012-12-26 NOTE — ED Provider Notes (Signed)
Medical screening examination/treatment/procedure(s) were performed by non-physician practitioner and as supervising physician I was immediately available for consultation/collaboration.    Pam Vanalstine J. Ovadia Lopp, MD 12/26/12 1613 

## 2013-03-05 ENCOUNTER — Emergency Department (HOSPITAL_COMMUNITY): Payer: PRIVATE HEALTH INSURANCE

## 2013-03-05 ENCOUNTER — Emergency Department (HOSPITAL_COMMUNITY)
Admission: EM | Admit: 2013-03-05 | Discharge: 2013-03-06 | Disposition: A | Discharge status: Home / Self Care | Payer: PRIVATE HEALTH INSURANCE | Attending: Emergency Medicine | Admitting: Emergency Medicine

## 2013-03-05 ENCOUNTER — Encounter (HOSPITAL_COMMUNITY): Payer: Self-pay | Admitting: *Deleted

## 2013-03-05 DIAGNOSIS — I10 Essential (primary) hypertension: Secondary | ICD-10-CM | POA: Insufficient documentation

## 2013-03-05 DIAGNOSIS — M62838 Other muscle spasm: Secondary | ICD-10-CM

## 2013-03-05 DIAGNOSIS — Z79899 Other long term (current) drug therapy: Secondary | ICD-10-CM | POA: Insufficient documentation

## 2013-03-05 DIAGNOSIS — M79609 Pain in unspecified limb: Secondary | ICD-10-CM | POA: Insufficient documentation

## 2013-03-05 DIAGNOSIS — E119 Type 2 diabetes mellitus without complications: Secondary | ICD-10-CM | POA: Insufficient documentation

## 2013-03-05 MED ORDER — NITROGLYCERIN 0.4 MG SL SUBL
0.4000 mg | SUBLINGUAL_TABLET | SUBLINGUAL | Status: DC | PRN
  Administered 2013-03-06: 0.4 mg via SUBLINGUAL

## 2013-03-05 MED ORDER — ASPIRIN 325 MG PO TABS
325.0000 mg | ORAL_TABLET | ORAL | Status: AC
  Administered 2013-03-05: 325 mg via ORAL
  Filled 2013-03-05: qty 1

## 2013-03-05 NOTE — ED Notes (Signed)
Pt c/o left chest, left arm pain, and posterior neck pain. Onset was Saturday gradual while lying down. Pt denies taking asa and nitro. Pt is A&Ox4, respirations equal and unlabored, skin warm and dry.

## 2013-03-06 ENCOUNTER — Encounter (HOSPITAL_COMMUNITY): Payer: Self-pay | Admitting: Emergency Medicine

## 2013-03-06 LAB — CBC
MCV: 81.6 fL (ref 78.0–100.0)
Platelets: 339 10*3/uL (ref 150–400)
RBC: 4.41 MIL/uL (ref 3.87–5.11)
RDW: 15.5 % (ref 11.5–15.5)
WBC: 5.8 10*3/uL (ref 4.0–10.5)

## 2013-03-06 LAB — BASIC METABOLIC PANEL
CO2: 24 mEq/L (ref 19–32)
Chloride: 104 mEq/L (ref 96–112)
Creatinine, Ser: 0.73 mg/dL (ref 0.50–1.10)
GFR calc Af Amer: >90 mL/min (ref >90)
Potassium: 4.1 mEq/L (ref 3.5–5.1)
Sodium: 136 mEq/L (ref 135–145)

## 2013-03-06 LAB — POCT I-STAT TROPONIN I: Troponin i, poc: 0.01 ng/mL (ref 0.00–0.08)

## 2013-03-06 MED ORDER — GI COCKTAIL ~~LOC~~
30.0000 mL | Freq: Once | ORAL | Status: AC
  Administered 2013-03-06: 30 mL via ORAL
  Filled 2013-03-06: qty 30

## 2013-03-06 MED ORDER — METHOCARBAMOL 500 MG PO TABS: 500.0000 mg | ORAL_TABLET | Freq: Two times a day (BID) | ORAL | Status: DC

## 2013-03-06 MED ORDER — KETOROLAC TROMETHAMINE 60 MG/2ML IM SOLN
60.0000 mg | Freq: Once | INTRAMUSCULAR | Status: AC
  Administered 2013-03-06: 60 mg via INTRAMUSCULAR
  Filled 2013-03-06: qty 2

## 2013-03-06 MED ORDER — NAPROXEN 375 MG PO TABS: 375.0000 mg | ORAL_TABLET | Freq: Two times a day (BID) | ORAL | Status: DC

## 2013-03-06 NOTE — ED Provider Notes (Addendum)
CSN: [id number]     Arrival date & time 03/05/13  2332 History   First MD Initiated Contact with Patient 03/06/13 0036     Chief Complaint  Patient presents with  . Chest Pain  . Arm Pain   (Consider location/radiation/quality/duration/timing/severity/associated sxs/prior Treatment) Patient is a 47 y.o. female presenting with chest pain and arm pain. The history is provided by the patient.  Chest Pain Pain location:  L chest and L lateral chest Pain quality: dull   Pain radiates to:  Does not radiate Pain radiates to the back: no   Pain severity:  Moderate Onset quality:  Gradual Duration:  2 days Timing:  Constant Progression:  Unchanged Chronicity:  New Context: lifting and movement   Relieved by:  Nothing Worsened by:  Nothing tried Ineffective treatments:  None tried Associated symptoms: no abdominal pain, no back pain, no cough, no diaphoresis, no dizziness, no fever, no near-syncope, no shortness of breath, no syncope, not vomiting and no weakness   Risk factors: no smoking   Arm Pain The current episode started more than 2 days ago. The problem occurs constantly. The problem has not changed since onset.Associated symptoms include chest pain. Pertinent negatives include no abdominal pain and no shortness of breath. Nothing aggravates the symptoms. Nothing relieves the symptoms. She has tried nothing for the symptoms. The treatment provided no relief.  Arm pain started first in hand then up then had chest wall pain.  Constant.  Pain increases with movement of the arm  Past Medical History  Diagnosis Date  . Hypertension   . Diabetes mellitus without complication    Past Surgical History  Procedure Laterality Date  . Cholecystectomy     History reviewed. No pertinent family history. History  Substance Use Topics  . Smoking status: Never Smoker   . Smokeless tobacco: Never Used  . Alcohol Use: Yes     Comment: occ   OB History   Grav Para Term Preterm Abortions TAB  SAB Ect Mult Living                 Review of Systems  Constitutional: Negative for fever and diaphoresis.  Respiratory: Negative for cough and shortness of breath.   Cardiovascular: Positive for chest pain. Negative for syncope and near-syncope.  Gastrointestinal: Negative for vomiting and abdominal pain.  Musculoskeletal: Negative for back pain.  Neurological: Negative for dizziness and weakness.  All other systems reviewed and are negative.    Allergies  Review of patient's allergies indicates no known allergies.  Home Medications   Current Outpatient Rx  Name  Route  Sig  Dispense  Refill  . acetaminophen (TYLENOL) 500 MG tablet   Oral   Take 1,000 mg by mouth every 6 (six) hours as needed for pain.         Marland Kitchen lisinopril-hydrochlorothiazide (PRINZIDE,ZESTORETIC) 20-25 MG per tablet   Oral   Take 1 tablet by mouth daily.         . Menthol, Topical Analgesic, (BIOFREEZE) 4 % GEL   Apply externally   Apply 1 application topically as needed (pain).          BP 138/69  Pulse 70  Temp(Src) 98.2 F (36.8 C) (Oral)  Resp 15  Ht 5\' 4"  (1.626 m)  Wt 246 lb (111.585 kg)  BMI 42.21 kg/m2  SpO2 97%  LMP 09/03/2012 Physical Exam  Constitutional: She is oriented to person, place, and time. She appears well-developed and well-nourished. No distress.  HENT:  Head: Normocephalic and atraumatic.  Mouth/Throat: Oropharynx is clear and moist.  Eyes: Conjunctivae are normal. Pupils are equal, round, and reactive to light.  Neck: Normal range of motion. Neck supple.  Cardiovascular: Normal rate, regular rhythm and intact distal pulses.   Pulmonary/Chest: Effort normal and breath sounds normal. No respiratory distress. She has no wheezes. She has no rales. She exhibits no tenderness.  Abdominal: Soft. Bowel sounds are normal. There is no tenderness. There is no rebound and no guarding.  Musculoskeletal: Normal range of motion.  Spasm in the left trapezius muscle.  Negative  neers tests of the LUE, biceps tendon intact 5/5 LUE strength intact sensation cap refill to fingers < 2 sec  Neurological: She is alert and oriented to person, place, and time.  Skin: Skin is warm and dry.  Psychiatric: She has a normal mood and affect.    ED Course  Procedures (including critical care time) Labs Review Labs Reviewed  BASIC METABOLIC PANEL - Abnormal; Notable for the following:    Glucose, Bld 109 (*)    All other components within normal limits  CBC  POCT I-STAT TROPONIN I   Imaging Review Dg Chest 2 View  03/06/2013   CLINICAL DATA:  Left chest and arm pain.  EXAM: CHEST  2 VIEW  COMPARISON:  PA and lateral chest 04/13/2012.  FINDINGS: Heart size and mediastinal contours are within normal limits. Both lungs are clear. Visualized skeletal structures are unremarkable.  IMPRESSION: No active cardiopulmonary disease.   Electronically Signed   By: Drusilla Kanner M.D.   On: 03/06/2013 00:09    MDM  No diagnosis found. Perc negative wells 0   Date: 03/06/2013  Rate: 75  Rhythm: normal sinus rhythm  QRS Axis: normal  Intervals: normal  ST/T Wave abnormalities: normal  Conduction Disutrbances: none  Narrative Interpretation: unremarkable  In the setting of > 8 hours of continuous pain with normal ekg and troponin ACS excluded.  Pain is clearly MSK and may have a radicular component will treat with nsaids and muscles relaxants follow up with PMD for ongoing care.  Patient verbalizes understanding and agrees to follow up    De Jaworski K Abbigaile Rockman-Rasch, MD 03/06/13 0226  Nicholson Starace K Prestina Raigoza-Rasch, MD 03/06/13 0227  Dorette Hartel K Pascha Fogal-Rasch, MD 03/06/13 [phone]

## 2013-03-06 NOTE — ED Notes (Signed)
Pt arrived to room

## 2013-03-06 NOTE — ED Notes (Signed)
Patient here for L sided chest pain starting yesterday radiating to the L arm, causing numbness to arm. Pt states it feels like gas pains. Pt states she had three brothers that all died from heart attacks. Denies nausea, dizziness, headache.

## 2013-03-07 NOTE — Progress Notes (Signed)
Received call from patient with concern about work note. Patient states, "My job won't let me come back today because this note is not clear. They say it don't release me to regular duty and I need to go back to work." [facility] spoke with Vangie Bicker., PA regarding note who advised for patient to bring note back to ED. Phoned patient at [phone] and relayed instructions to return to ED with note. Voiced understanding. No further needs identified.

## 2013-04-08 ENCOUNTER — Encounter (HOSPITAL_COMMUNITY): Payer: Self-pay | Admitting: Emergency Medicine

## 2013-04-08 ENCOUNTER — Emergency Department (HOSPITAL_COMMUNITY)
Admission: EM | Admit: 2013-04-08 | Discharge: 2013-04-08 | Disposition: A | Discharge status: Home / Self Care | Payer: PRIVATE HEALTH INSURANCE | Attending: Emergency Medicine | Admitting: Emergency Medicine

## 2013-04-08 DIAGNOSIS — H00016 Hordeolum externum left eye, unspecified eyelid: Secondary | ICD-10-CM

## 2013-04-08 DIAGNOSIS — R51 Headache: Secondary | ICD-10-CM | POA: Insufficient documentation

## 2013-04-08 DIAGNOSIS — I10 Essential (primary) hypertension: Secondary | ICD-10-CM | POA: Insufficient documentation

## 2013-04-08 DIAGNOSIS — E119 Type 2 diabetes mellitus without complications: Secondary | ICD-10-CM | POA: Insufficient documentation

## 2013-04-08 DIAGNOSIS — J45909 Unspecified asthma, uncomplicated: Secondary | ICD-10-CM

## 2013-04-08 DIAGNOSIS — Z79899 Other long term (current) drug therapy: Secondary | ICD-10-CM | POA: Insufficient documentation

## 2013-04-08 DIAGNOSIS — H00019 Hordeolum externum unspecified eye, unspecified eyelid: Secondary | ICD-10-CM | POA: Insufficient documentation

## 2013-04-08 DIAGNOSIS — J45901 Unspecified asthma with (acute) exacerbation: Secondary | ICD-10-CM | POA: Insufficient documentation

## 2013-04-08 DIAGNOSIS — J069 Acute upper respiratory infection, unspecified: Secondary | ICD-10-CM

## 2013-04-08 DIAGNOSIS — R110 Nausea: Secondary | ICD-10-CM | POA: Insufficient documentation

## 2013-04-08 MED ORDER — PREDNISONE 20 MG PO TABS: 40.0000 mg | ORAL_TABLET | Freq: Every day | ORAL | Status: DC

## 2013-04-08 MED ORDER — GENTAMICIN SULFATE 0.3 % OP SOLN: 1.0000 [drp] | Freq: Four times a day (QID) | OPHTHALMIC | Status: DC

## 2013-04-08 MED ORDER — PREDNISONE 20 MG PO TABS: 40.0000 mg | ORAL_TABLET | Freq: Once | ORAL | Status: DC

## 2013-04-08 NOTE — ED Provider Notes (Signed)
Medical screening examination/treatment/procedure(s) were performed by non-physician practitioner and as supervising physician I was immediately available for consultation/collaboration.  EKG Interpretation   None        Doug Sou, MD 04/08/13 1658

## 2013-04-08 NOTE — ED Provider Notes (Signed)
CSN: [id number]     Arrival date & time 04/08/13  1035 History  This chart was scribed for non-physician practitioner, Wynetta Emery, PA-C,working with Doug Sou, MD, by Karle Plumber, ED Scribe.  This patient was seen in room TR06C/TR06C and the patient's care was started at 11:30 AM.  Chief Complaint  Patient presents with  . Nasal Congestion  . Eye Problem   The history is provided by the patient. No language interpreter was used.   HPI Comments:  Natasha Doyle is a 47 y.o. female who presents to the Emergency Department complaining of rhinorrhea, dry cough, wheezing and left eye swelling and soreness onset 4 days. She reports associated clear discharge of the left eye, headache and nausea. She denies visual disturbance, SOB, fever, vomiting, crusting of the left eye, and diarrhea, vomiting, chest pain. She denies h/o asthma or DM, however medical record review shows that she has a diagnosis of diabetes. She denies smoking.  Past Medical History  Diagnosis Date  . Hypertension   . Diabetes mellitus without complication    Past Surgical History  Procedure Laterality Date  . Cholecystectomy     History reviewed. No pertinent family history. History  Substance Use Topics  . Smoking status: Never Smoker   . Smokeless tobacco: Never Used  . Alcohol Use: Yes     Comment: occ   OB History   Grav Para Term Preterm Abortions TAB SAB Ect Mult Living                 Review of Systems A complete 10 system review of systems was obtained and all systems are negative except as noted in the HPI and PMH.   Allergies  Review of patient's allergies indicates no known allergies.  Home Medications   Current Outpatient Rx  Name  Route  Sig  Dispense  Refill  . acetaminophen (TYLENOL) 500 MG tablet   Oral   Take 1,000 mg by mouth every 6 (six) hours as needed for pain.         Marland Kitchen lisinopril-hydrochlorothiazide (PRINZIDE,ZESTORETIC) 20-25 MG per tablet   Oral   Take 1 tablet  by mouth daily.         Marland Kitchen gentamicin (GARAMYCIN) 0.3 % ophthalmic solution   Right Eye   Place 1 drop into the right eye 4 (four) times daily.   5 mL   0    Triage Vitals: BP 159/91  Pulse 72  Temp(Src) 98.3 F (36.8 C) (Oral)  Resp 18  SpO2 96%  LMP 09/03/2012 Physical Exam  Nursing note and vitals reviewed. Constitutional: She is oriented to person, place, and time. She appears well-developed and well-nourished. No distress.  Obese  HENT:  Head: Normocephalic and atraumatic.  Mouth/Throat: Oropharynx is clear and moist.  Eyes: Conjunctivae and EOM are normal. Pupils are equal, round, and reactive to light. Left eye exhibits hordeolum. Left eye exhibits no discharge. Left conjunctiva has no hemorrhage. No scleral icterus. Left eye exhibits normal extraocular motion and no nystagmus. Left pupil is round and reactive. Pupils are equal.    Neck: Normal range of motion. Neck supple.  Cardiovascular: Normal rate, regular rhythm and intact distal pulses.   Pulmonary/Chest: Effort normal. No stridor. No respiratory distress. She has wheezes. She has no rales. She exhibits no tenderness.  Patient has mild scattered expiratory wheezing, inspiration and expiration are equal in length.  Abdominal: Soft. Bowel sounds are normal. She exhibits no distension and no mass. There is no tenderness. There  is no rebound and no guarding.  Musculoskeletal: Normal range of motion. She exhibits no edema.  Neurological: She is alert and oriented to person, place, and time.  Psychiatric: She has a normal mood and affect.    ED Course  Procedures (including critical care time) DIAGNOSTIC STUDIES: Oxygen Saturation is 96% on RA, adequate by my interpretation.   COORDINATION OF CARE: 11:33 AM- Will refer to ophthalmologist. Advised pt to apply warm compresses to left eye and will prescribe an antibiotic ointment for her eye. Will prescribe an MDI for wheezing.  Pt verbalizes understanding and agrees  to plan.  Medications - No data to display  Labs Review Labs Reviewed - No data to display Imaging Review No results found.  EKG Interpretation   None       MDM   1. URI (upper respiratory infection)   2. Reactive airway disease   3. Stye, left    Filed Vitals:   04/08/13 1040  BP: 159/91  Pulse: 72  Temp: 98.3 F (36.8 C)  TempSrc: Oral  Resp: 18  SpO2: 96%     Natasha Doyle is a 47 y.o. female  plenty of rhinorrhea, dry cough, wheezing, nausea and swelling to left upper eyelid worsening over the course of the last 2 days. Lung sounds are clear to auscultation except for mild scattered wheezing. Patient has hordeolum, him concerning for a preseptal cellulitis. Patient be encouraged to perform wet compresses and given a gentamicin antibiotic solution. Ophthalmology consult given. Patient will also be given the overall inhaler. Steroids are not indicated as she is diabetic. I have encouraged her to follow with her primary care physician for checkup in the next 48 hours. We've discussed return precautions.   Pt is hemodynamically stable, appropriate for, and amenable to discharge at this time. Pt verbalized understanding and agrees with care plan. All questions answered. Outpatient follow-up and specific return precautions discussed.    New Prescriptions   GENTAMICIN (GARAMYCIN) 0.3 % OPHTHALMIC SOLUTION    Place 1 drop into the right eye 4 (four) times daily.    I personally performed the services described in this documentation, which was scribed in my presence. The recorded information has been reviewed and is accurate.  Note: Portions of this report may have been transcribed using voice recognition software. Every effort was made to ensure accuracy; however, inadvertent computerized transcription errors may be present    Wynetta Emery, PA-C 04/08/13 1214

## 2013-04-08 NOTE — ED Notes (Signed)
Per pt having left eye swelling with drainage. sts it started after she began to have URI symptoms.

## 2013-04-15 ENCOUNTER — Emergency Department (HOSPITAL_COMMUNITY)
Admission: EM | Admit: 2013-04-15 | Discharge: 2013-04-15 | Disposition: A | Discharge status: Home / Self Care | Payer: PRIVATE HEALTH INSURANCE | Attending: Emergency Medicine | Admitting: Emergency Medicine

## 2013-04-15 ENCOUNTER — Emergency Department (HOSPITAL_COMMUNITY): Payer: PRIVATE HEALTH INSURANCE

## 2013-04-15 ENCOUNTER — Encounter (HOSPITAL_COMMUNITY): Payer: Self-pay | Admitting: Emergency Medicine

## 2013-04-15 DIAGNOSIS — R519 Headache, unspecified: Secondary | ICD-10-CM

## 2013-04-15 DIAGNOSIS — Z79899 Other long term (current) drug therapy: Secondary | ICD-10-CM | POA: Insufficient documentation

## 2013-04-15 DIAGNOSIS — Z792 Long term (current) use of antibiotics: Secondary | ICD-10-CM | POA: Insufficient documentation

## 2013-04-15 DIAGNOSIS — G43909 Migraine, unspecified, not intractable, without status migrainosus: Secondary | ICD-10-CM | POA: Insufficient documentation

## 2013-04-15 DIAGNOSIS — I10 Essential (primary) hypertension: Secondary | ICD-10-CM | POA: Insufficient documentation

## 2013-04-15 DIAGNOSIS — E669 Obesity, unspecified: Secondary | ICD-10-CM | POA: Insufficient documentation

## 2013-04-15 HISTORY — DX: Obesity, unspecified: E66.9

## 2013-04-15 MED ORDER — ONDANSETRON HCL 4 MG/2ML IJ SOLN
INTRAMUSCULAR | Status: AC
  Filled 2013-04-15: qty 2

## 2013-04-15 MED ORDER — KETOROLAC TROMETHAMINE 30 MG/ML IJ SOLN
30.0000 mg | Freq: Once | INTRAMUSCULAR | Status: AC
  Administered 2013-04-15: 30 mg via INTRAVENOUS
  Filled 2013-04-15: qty 1

## 2013-04-15 MED ORDER — NAPROXEN 500 MG PO TABS: 500.0000 mg | ORAL_TABLET | Freq: Two times a day (BID) | ORAL | Status: DC

## 2013-04-15 MED ORDER — METOCLOPRAMIDE HCL 5 MG/ML IJ SOLN
10.0000 mg | Freq: Once | INTRAMUSCULAR | Status: AC
  Administered 2013-04-15: 10 mg via INTRAVENOUS
  Filled 2013-04-15: qty 2

## 2013-04-15 MED ORDER — ONDANSETRON HCL 4 MG/2ML IJ SOLN
4.0000 mg | Freq: Once | INTRAMUSCULAR | Status: AC
  Administered 2013-04-15: 4 mg via INTRAVENOUS

## 2013-04-15 MED ORDER — ONDANSETRON 4 MG PO TBDP: 4.0000 mg | ORAL_TABLET | Freq: Three times a day (TID) | ORAL | Status: DC | PRN

## 2013-04-15 NOTE — ED Notes (Signed)
Pt reports onset this am of posterior headache, n/v and "feeling shaky." denies hx of migraines. bp 144/8/1 at triage, airway intact.

## 2013-04-15 NOTE — ED Notes (Signed)
Discharge instructions reviewed with pt. Pt verbalized understanding.   

## 2013-04-15 NOTE — ED Notes (Signed)
MD at bedside. 

## 2013-04-15 NOTE — ED Notes (Signed)
Pt reports awakening this AM with HA that has progressively worsened. States it was initially "at the top of my head, but now it has moved to the back of my head." Pt also reports N/V since AM with 2 episodes of nausea. Denies any vision changes, denies anything makes worse or better. Neuro intact.

## 2013-04-15 NOTE — ED Provider Notes (Signed)
CSN: [id number]     Arrival date & time 04/15/13  1431 History   First MD Initiated Contact with Patient 04/15/13 1706     Chief Complaint  Patient presents with  . Headache  . Emesis   HPI  Patient presents with a headache and started slowly yesterday .  Started left frontal that is now you falls to become left occipital. Still present. She states it is a 4/10. . She's had mild headaches in the past but this seems different. Not thunderclap. No fever or chills no photophobia. No vision changes. She had a "stye". She finished antibiotic drops for, and that is better. No falls or trauma. Mild nausea. Not photophobic. No other symptoms of she took some Tylenol at home it did decrease from a 6 to a 4  Past Medical History  Diagnosis Date  . Hypertension   . Obesity    Past Surgical History  Procedure Laterality Date  . Cholecystectomy     History reviewed. No pertinent family history. History  Substance Use Topics  . Smoking status: Never Smoker   . Smokeless tobacco: Never Used  . Alcohol Use: Yes     Comment: occ   OB History   Grav Para Term Preterm Abortions TAB SAB Ect Mult Living                 Review of Systems  Constitutional: Negative for fever, chills, diaphoresis, appetite change and fatigue.  HENT: Negative for mouth sores, sore throat and trouble swallowing.        States her stye is better  Eyes: Negative for visual disturbance.  Respiratory: Negative for cough, chest tightness, shortness of breath and wheezing.   Cardiovascular: Negative for chest pain.  Gastrointestinal: Negative for nausea, vomiting, abdominal pain, diarrhea and abdominal distention.  Endocrine: Negative for polydipsia, polyphagia and polyuria.  Genitourinary: Negative for dysuria, frequency and hematuria.  Musculoskeletal: Negative for gait problem.  Skin: Negative for color change, pallor and rash.  Neurological: Negative for dizziness, syncope, light-headedness and headaches.   Hematological: Does not bruise/bleed easily.  Psychiatric/Behavioral: Negative for behavioral problems and confusion.    Allergies  Review of patient's allergies indicates no known allergies.  Home Medications   Current Outpatient Rx  Name  Route  Sig  Dispense  Refill  . acetaminophen (TYLENOL) 500 MG tablet   Oral   Take 1,000 mg by mouth every 6 (six) hours as needed for pain.         Marland Kitchen gentamicin (GARAMYCIN) 0.3 % ophthalmic solution   Right Eye   Place 1 drop into the right eye 4 (four) times daily.   5 mL   0   . lisinopril-hydrochlorothiazide (PRINZIDE,ZESTORETIC) 20-25 MG per tablet   Oral   Take 1 tablet by mouth daily.         . naproxen (NAPROSYN) 500 MG tablet   Oral   Take 1 tablet (500 mg total) by mouth 2 (two) times daily.   30 tablet   0   . ondansetron (ZOFRAN ODT) 4 MG disintegrating tablet   Oral   Take 1 tablet (4 mg total) by mouth every 8 (eight) hours as needed for nausea.   20 tablet   0    BP 144/81  Pulse 71  Temp(Src) 98.2 F (36.8 C) (Oral)  Resp 18  SpO2 100%  LMP 09/03/2012 Physical Exam  Constitutional: She is oriented to person, place, and time. She appears well-developed and well-nourished. No distress.  HENT:  Head: Normocephalic.  Normal HEENT exam. No rash or vesicles. Normal cranial nerves. Supple neck.  Eyes: Conjunctivae are normal. Pupils are equal, round, and reactive to light. No scleral icterus.  Neck: Normal range of motion. Neck supple. No thyromegaly present.  Cardiovascular: Normal rate and regular rhythm.  Exam reveals no gallop and no friction rub.   No murmur heard. Pulmonary/Chest: Effort normal and breath sounds normal. No respiratory distress. She has no wheezes. She has no rales.  Abdominal: Soft. Bowel sounds are normal. She exhibits no distension. There is no tenderness. There is no rebound.  Musculoskeletal: Normal range of motion.  Neurological: She is alert and oriented to person, place, and  time.  Skin: Skin is warm and dry. No rash noted.  Psychiatric: She has a normal mood and affect. Her behavior is normal.    ED Course  Procedures (including critical care time) Labs Review Labs Reviewed - No data to display Imaging Review Ct Head Wo Contrast  04/15/2013   CLINICAL DATA:  47 year old female posterior headache nausea and vomiting. Vertigo and hypertension. Initial encounter.  EXAM: CT HEAD WITHOUT CONTRAST  TECHNIQUE: Contiguous axial images were obtained from the base of the skull through the vertex without intravenous contrast.  COMPARISON:  09/05/2011.  FINDINGS: Stable and negative visible paranasal sinuses and mastoids. No acute osseous abnormality identified. Visualized orbits and scalp soft tissues are within normal limits.  Stable and normal cerebral volume. No midline shift, ventriculomegaly, mass effect, evidence of mass lesion, intracranial hemorrhage or evidence of cortically based acute infarction. Gray-white matter differentiation is within normal limits throughout the brain. No suspicious intracranial vascular hyperdensity. Dural calcifications the identified.  IMPRESSION: Stable and View noncontrast CT appearance of the brain.   Electronically Signed   By: Augusto Gamble M.D.   On: 04/15/2013 19:16    EKG Interpretation   None       MDM   1. Headache   2. Migraine headache    Symptoms are most consistent with unilateral migraine-type headache. Her CT scan was normal. She was given Zofran first and then Reglan Toradol because resolved. She is appropriate for outpatient treatment given prescription for Reglan and naproxen.    Roney Marion, MD 04/15/13 2200

## 2013-05-24 ENCOUNTER — Emergency Department (HOSPITAL_COMMUNITY)
Admission: EM | Admit: 2013-05-24 | Discharge: 2013-05-24 | Disposition: A | Discharge status: Home / Self Care | Payer: PRIVATE HEALTH INSURANCE | Attending: Emergency Medicine | Admitting: Emergency Medicine

## 2013-05-24 ENCOUNTER — Encounter (HOSPITAL_COMMUNITY): Payer: Self-pay | Admitting: Emergency Medicine

## 2013-05-24 DIAGNOSIS — I10 Essential (primary) hypertension: Secondary | ICD-10-CM

## 2013-05-24 DIAGNOSIS — Z79899 Other long term (current) drug therapy: Secondary | ICD-10-CM | POA: Insufficient documentation

## 2013-05-24 DIAGNOSIS — R519 Headache, unspecified: Secondary | ICD-10-CM

## 2013-05-24 DIAGNOSIS — R51 Headache: Secondary | ICD-10-CM | POA: Insufficient documentation

## 2013-05-24 DIAGNOSIS — E669 Obesity, unspecified: Secondary | ICD-10-CM | POA: Insufficient documentation

## 2013-05-24 LAB — BASIC METABOLIC PANEL
BUN: 9 mg/dL (ref 6–23)
Chloride: 105 mEq/L (ref 96–112)
GFR calc Af Amer: >90 mL/min (ref >90)
GFR calc non Af Amer: >90 mL/min (ref >90)
Glucose, Bld: 133 mg/dL — ABNORMAL HIGH (ref 70–99)
Potassium: 3.9 mEq/L (ref 3.5–5.1)
Sodium: 138 mEq/L (ref 135–145)

## 2013-05-24 MED ORDER — LISINOPRIL 20 MG PO TABS: 20.0000 mg | ORAL_TABLET | Freq: Every day | ORAL | Status: DC

## 2013-05-24 MED ORDER — ONDANSETRON 4 MG PO TBDP
4.0000 mg | ORAL_TABLET | Freq: Once | ORAL | Status: AC
  Administered 2013-05-24: 4 mg via ORAL
  Filled 2013-05-24: qty 1

## 2013-05-24 MED ORDER — CLONIDINE HCL 0.1 MG PO TABS: 0.1000 mg | ORAL_TABLET | Freq: Once | ORAL | Status: DC

## 2013-05-24 MED ORDER — ACETAMINOPHEN 325 MG PO TABS
650.0000 mg | ORAL_TABLET | Freq: Once | ORAL | Status: AC
  Administered 2013-05-24: 650 mg via ORAL
  Filled 2013-05-24: qty 2

## 2013-05-24 MED ORDER — KETOROLAC TROMETHAMINE 30 MG/ML IJ SOLN: 15.0000 mg | Freq: Once | INTRAMUSCULAR | Status: DC

## 2013-05-24 MED ORDER — KETOROLAC TROMETHAMINE 30 MG/ML IJ SOLN
30.0000 mg | Freq: Once | INTRAMUSCULAR | Status: AC
  Administered 2013-05-24: 30 mg via INTRAMUSCULAR
  Filled 2013-05-24: qty 1

## 2013-05-24 NOTE — ED Notes (Signed)
Pt from home co headache and neck pain starting this am. Pt states hx of high bp, recently stopped taking bp medications per md. NSD.

## 2013-05-24 NOTE — ED Provider Notes (Addendum)
CSN: [id number]     Arrival date & time 05/24/13  4098 History   First MD Initiated Contact with Patient 05/24/13 [phone]     Chief Complaint  Patient presents with  . Headache   (Consider location/radiation/quality/duration/timing/severity/associated sxs/prior Treatment) HPI Comments: 47 yo female with HTN, headache hx presents with htn and headache.  HA gradual onset since this am, similar to previous.   Patient is a 47 y.o. female presenting with headaches. The history is provided by the patient.  Headache Associated symptoms: neck pain and photophobia   Associated symptoms: no back pain, no fever, no neck stiffness and no numbness     Past Medical History  Diagnosis Date  . Hypertension   . Obesity    Past Surgical History  Procedure Laterality Date  . Cholecystectomy     History reviewed. No pertinent family history. History  Substance Use Topics  . Smoking status: Never Smoker   . Smokeless tobacco: Never Used  . Alcohol Use: Yes     Comment: occ   OB History   Grav Para Term Preterm Abortions TAB SAB Ect Mult Living                 Review of Systems  Constitutional: Negative for fever and chills.  Eyes: Positive for photophobia.  Cardiovascular: Negative for chest pain.  Musculoskeletal: Positive for neck pain. Negative for back pain and neck stiffness.  Neurological: Positive for headaches. Negative for syncope, weakness and numbness.    Allergies  Review of patient's allergies indicates no known allergies.  Home Medications   Current Outpatient Rx  Name  Route  Sig  Dispense  Refill  . ibuprofen (ADVIL,MOTRIN) 200 MG tablet   Oral   Take 800 mg by mouth every 6 (six) hours as needed.         Marland Kitchen lisinopril-hydrochlorothiazide (PRINZIDE,ZESTORETIC) 20-25 MG per tablet   Oral   Take 1 tablet by mouth daily.         Marland Kitchen lisinopril (PRINIVIL,ZESTRIL) 20 MG tablet   Oral   Take 1 tablet (20 mg total) by mouth daily.   30 tablet   0    BP 160/97   Pulse 67  Temp(Src) 98.2 F (36.8 C) (Oral)  Resp 18  SpO2 100%  LMP 09/03/2012 Physical Exam  Nursing note and vitals reviewed. Constitutional: She is oriented to person, place, and time. She appears well-developed and well-nourished.  HENT:  Head: Normocephalic and atraumatic.  Eyes: Conjunctivae are normal. Right eye exhibits no discharge. Left eye exhibits no discharge.  Neck: Normal range of motion. Neck supple. No tracheal deviation present.  Cardiovascular: Normal rate, regular rhythm and intact distal pulses.   Pulmonary/Chest: Effort normal and breath sounds normal.  Abdominal: Soft.  Musculoskeletal: She exhibits no edema.  Neurological: She is alert and oriented to person, place, and time. GCS eye subscore is 4. GCS verbal subscore is 5. GCS motor subscore is 6.  5+ strength in UE and LE with f/e at major joints. Sensation to palpation intact in UE and LE. CNs 2-12 grossly intact.  EOMFI.  PERRL.   Finger nose and coordination intact bilateral.   Visual fields intact to finger testing.   Skin: Skin is warm. No rash noted.  Psychiatric: She has a normal mood and affect.    ED Course  Procedures (including critical care time) Labs Review Labs Reviewed  BASIC METABOLIC PANEL - Abnormal; Notable for the following:    Glucose, Bld 133 (*)  All other components within normal limits   Imaging Review No results found.  EKG Interpretation   None       MDM   1. High blood pressure   2. Headache    HA resolved on recheck. Normal neuro exam. No meningismus. Pt recently had CT head and would like to hold at this time. Discussed risks of uncontrolled high bp, will restart lisinopril as pt tolerated well in the past But she has issues with hctz. CLose fup stressed.  Results and differential diagnosis were discussed with the patient. Close follow up outpatient was discussed, patient comfortable with the plan.   Diagnosis: htn, headache    Enid Skeens,  MD 05/24/13 1033  Enid Skeens, MD 06/08/13 1159

## 2013-06-29 ENCOUNTER — Emergency Department (INDEPENDENT_AMBULATORY_CARE_PROVIDER_SITE_OTHER)
Admission: EM | Admit: 2013-06-29 | Discharge: 2013-06-29 | Disposition: A | Discharge status: Home / Self Care | Payer: Self-pay | Source: Home / Self Care | Attending: Family Medicine | Admitting: Family Medicine

## 2013-06-29 ENCOUNTER — Encounter (HOSPITAL_COMMUNITY): Payer: Self-pay | Admitting: Emergency Medicine

## 2013-06-29 DIAGNOSIS — J111 Influenza due to unidentified influenza virus with other respiratory manifestations: Secondary | ICD-10-CM

## 2013-06-29 DIAGNOSIS — R69 Illness, unspecified: Principal | ICD-10-CM

## 2013-06-29 MED ORDER — ONDANSETRON 4 MG PO TBDP
8.0000 mg | ORAL_TABLET | Freq: Once | ORAL | Status: AC
  Administered 2013-06-29: 8 mg via ORAL

## 2013-06-29 MED ORDER — ONDANSETRON 4 MG PO TBDP
ORAL_TABLET | ORAL | Status: AC
  Filled 2013-06-29: qty 2

## 2013-06-29 MED ORDER — TRAMADOL HCL 50 MG PO TABS: 50.0000 mg | ORAL_TABLET | Freq: Four times a day (QID) | ORAL | Status: DC | PRN

## 2013-06-29 MED ORDER — ONDANSETRON HCL 8 MG PO TABS: 8.0000 mg | ORAL_TABLET | Freq: Three times a day (TID) | ORAL | Status: DC | PRN

## 2013-06-29 MED ORDER — OSELTAMIVIR PHOSPHATE 75 MG PO CAPS: 75.0000 mg | ORAL_CAPSULE | Freq: Two times a day (BID) | ORAL | Status: DC

## 2013-06-29 NOTE — ED Notes (Signed)
C/o diarrhea, vomiting, and body ache States she started feeling like this Wednesday States she does have a non productive cough and runny nose Tylenol was taken as tx

## 2013-06-29 NOTE — ED Provider Notes (Signed)
Delilah Shanamela Hunsberger is a 48 y.o. female who presents to Urgent Care today for one day of nausea vomiting diarrhea coughing congestion. No shortness of breath. 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 She's tried Imodium Pepto-Bismol Tylenol and TheraFlu which has helped a bit. She has not had a flu vaccination yet this year.   Past Medical History  Diagnosis Date  . Hypertension   . Obesity    History  Substance Use Topics  . Smoking status: Never Smoker   . Smokeless tobacco: Never Used  . Alcohol Use: Yes     Comment: occ   ROS as above Medications: No current facility-administered medications for this encounter.   Current Outpatient Prescriptions  Medication Sig Dispense Refill  . ibuprofen (ADVIL,MOTRIN) 200 MG tablet Take 800 mg by mouth every 6 (six) hours as needed.      Marland Kitchen. lisinopril (PRINIVIL,ZESTRIL) 20 MG tablet Take 1 tablet (20 mg total) by mouth daily.  30 tablet  0  . lisinopril-hydrochlorothiazide (PRINZIDE,ZESTORETIC) 20-25 MG per tablet Take 1 tablet by mouth daily.      . ondansetron (ZOFRAN) 8 MG tablet Take 1 tablet (8 mg total) by mouth every 8 (eight) hours as needed for nausea or vomiting.  20 tablet  0  . oseltamivir (TAMIFLU) 75 MG capsule Take 1 capsule (75 mg total) by mouth every 12 (twelve) hours.  10 capsule  0  . traMADol (ULTRAM) 50 MG tablet Take 1 tablet (50 mg total) by mouth every 6 (six) hours as needed (pain or cough).  15 tablet  0    Exam:  BP 148/82  Pulse 79  Temp(Src) 98.4 F (36.9 C) (Oral)  Resp 20  SpO2 100%  LMP 09/03/2012 Gen: Well NAD HEENT: EOMI,  MMM posterior pharynx with cobblestone. Tympanic membranes are normal appearing bilaterally Lungs: Normal work of breathing. CTABL Heart: RRR no MRG Abd: NABS, Soft. NT, ND Exts: Brisk capillary refill, warm and well perfused.   Assessment and Plan: 48 y.o. female with influenza-like illness. Plan treat with Tamiflu,  Zofran, tramadol for cough and pain. Recommend ibuprofen as well. Followup with primary care provider as needed. Work note provided.  Discussed warning signs or symptoms. Please see discharge instructions. Patient expresses understanding.    Rodolph BongEvan S Aison Malveaux, MD 06/29/13 [id number]

## 2013-06-29 NOTE — Discharge Instructions (Signed)
Thank you for coming in today. Use Tylenol as needed for pain fevers or chills. Take Tamiflu twice daily for 5 days. His tramadol for pain or cough. Use Zofran for vomiting as needed. Call or go to the emergency room if you get worse, have trouble breathing, have chest pains, or palpitations.  If your belly pain worsens, or you have high fever, bad vomiting, blood in your stool or black tarry stool go to the Emergency Room.

## 2013-09-28 ENCOUNTER — Encounter: Payer: Self-pay | Admitting: Obstetrics & Gynecology

## 2013-09-28 ENCOUNTER — Ambulatory Visit (INDEPENDENT_AMBULATORY_CARE_PROVIDER_SITE_OTHER): Payer: PRIVATE HEALTH INSURANCE | Admitting: Obstetrics & Gynecology

## 2013-09-28 VITALS — BP 143/87 | HR 71 | Temp 98.0°F | Ht 64.0 in | Wt 281.3 lb

## 2013-09-28 DIAGNOSIS — I10 Essential (primary) hypertension: Secondary | ICD-10-CM | POA: Insufficient documentation

## 2013-09-28 DIAGNOSIS — Z01419 Encounter for gynecological examination (general) (routine) without abnormal findings: Secondary | ICD-10-CM

## 2013-09-28 NOTE — Progress Notes (Signed)
Patient ID: Natasha Doyle, female   DOB: 02/01/1966, 48 y.o.   MRN: [id number]  Chief Complaint  Patient presents with  . Gynecologic Exam    HPI Natasha Doyle is a 48 y.o. female.  [id number] Patient's last menstrual period was 09/03/2012.  Last pap was 04/2010, normal. Never had a mammogram   HPI  Past Medical History  Diagnosis Date  . Hypertension   . Obesity     Past Surgical History  Procedure Laterality Date  . Cholecystectomy      Family History  Problem Relation Age of Onset  . Cancer Mother     breast  . Heart disease Brother   . Hyperlipidemia Brother   . Hypertension Brother     Social History History  Substance Use Topics  . Smoking status: Never Smoker   . Smokeless tobacco: Never Used  . Alcohol Use: Yes     Comment: occ    No Known Allergies  Current Outpatient Prescriptions  Medication Sig Dispense Refill  . ibuprofen (ADVIL,MOTRIN) 200 MG tablet Take 800 mg by mouth every 6 (six) hours as needed.      Marland Kitchen. lisinopril (PRINIVIL,ZESTRIL) 20 MG tablet Take 1 tablet (20 mg total) by mouth daily.  30 tablet  0  . lisinopril-hydrochlorothiazide (PRINZIDE,ZESTORETIC) 20-25 MG per tablet Take 1 tablet by mouth daily.      . ondansetron (ZOFRAN) 8 MG tablet Take 1 tablet (8 mg total) by mouth every 8 (eight) hours as needed for nausea or vomiting.  20 tablet  0   No current facility-administered medications for this visit.    Review of Systems Review of Systems  Constitutional: Negative.   Gastrointestinal: Positive for constipation and blood in stool. Negative for nausea.  Genitourinary: Negative for vaginal bleeding, vaginal discharge and menstrual problem.    Blood pressure 143/87, pulse 71, temperature 98 F (36.7 C), temperature source Oral, height 5\' 4"  (1.626 m), weight 281 lb 4.8 oz (127.597 kg), last menstrual period 09/03/2012.  Physical Exam Physical Exam  Nursing note and vitals reviewed. Constitutional: She is oriented to person, place,  and time. She appears well-developed. No distress.  obese  HENT:  Head: Normocephalic and atraumatic.  Mouth/Throat: Oropharynx is clear and moist.  Neck: Normal range of motion. Neck supple. No thyromegaly present.  Pulmonary/Chest: Effort normal. No respiratory distress.  Abdominal: Soft. She exhibits no distension and no mass. There is no tenderness.  Genitourinary: Vagina normal and uterus normal. No vaginal discharge found.  No mass or tenderness, pap sent  Musculoskeletal: Normal range of motion.  Neurological: She is alert and oriented to person, place, and time.  Skin: Skin is warm and dry.  Psychiatric: She has a normal mood and affect. Her behavior is normal.    Data Reviewed Pap test  Assessment    Well woman exam, needs routine screening, is scheduled for colonoscopy      Plan    Mammogram , weight loss, return to PCP for medical management        Adam PhenixJames G Arnold 09/28/2013, 11:50 AM

## 2013-10-01 ENCOUNTER — Other Ambulatory Visit: Payer: Self-pay | Admitting: Obstetrics & Gynecology

## 2013-10-01 ENCOUNTER — Ambulatory Visit (HOSPITAL_COMMUNITY)
Admission: RE | Admit: 2013-10-01 | Discharge: 2013-10-01 | Disposition: A | Discharge status: Home / Self Care | Payer: PRIVATE HEALTH INSURANCE | Source: Ambulatory Visit | Attending: Obstetrics & Gynecology | Admitting: Obstetrics & Gynecology

## 2013-10-01 DIAGNOSIS — Z1231 Encounter for screening mammogram for malignant neoplasm of breast: Secondary | ICD-10-CM | POA: Insufficient documentation

## 2013-10-01 DIAGNOSIS — Z01419 Encounter for gynecological examination (general) (routine) without abnormal findings: Secondary | ICD-10-CM

## 2013-11-04 ENCOUNTER — Encounter (HOSPITAL_COMMUNITY): Payer: Self-pay | Admitting: Emergency Medicine

## 2013-11-04 DIAGNOSIS — E669 Obesity, unspecified: Secondary | ICD-10-CM | POA: Insufficient documentation

## 2013-11-04 DIAGNOSIS — R51 Headache: Secondary | ICD-10-CM | POA: Insufficient documentation

## 2013-11-04 DIAGNOSIS — I10 Essential (primary) hypertension: Secondary | ICD-10-CM | POA: Insufficient documentation

## 2013-11-04 NOTE — ED Notes (Signed)
Pt states headache going into her left face that started approximately 30 minutes prior.

## 2013-11-05 ENCOUNTER — Emergency Department (HOSPITAL_COMMUNITY)
Admission: EM | Admit: 2013-11-05 | Discharge: 2013-11-05 | Discharge status: Against Medical Advice | Payer: PRIVATE HEALTH INSURANCE | Attending: Emergency Medicine | Admitting: Emergency Medicine

## 2013-11-05 NOTE — ED Notes (Addendum)
Up to desk. "Took her own medicine and is feeling better. Choosing to leave". Encouraged to stay. Also to return if: worsens, changes mind or develops new sx.

## 2014-02-07 ENCOUNTER — Encounter (HOSPITAL_COMMUNITY): Payer: Self-pay | Admitting: Emergency Medicine

## 2014-02-07 ENCOUNTER — Emergency Department (HOSPITAL_COMMUNITY)
Admission: EM | Admit: 2014-02-07 | Discharge: 2014-02-07 | Disposition: A | Discharge status: Home / Self Care | Payer: PRIVATE HEALTH INSURANCE | Attending: Emergency Medicine | Admitting: Emergency Medicine

## 2014-02-07 DIAGNOSIS — E669 Obesity, unspecified: Secondary | ICD-10-CM | POA: Diagnosis not present

## 2014-02-07 DIAGNOSIS — R519 Headache, unspecified: Secondary | ICD-10-CM

## 2014-02-07 DIAGNOSIS — R51 Headache: Secondary | ICD-10-CM | POA: Diagnosis not present

## 2014-02-07 DIAGNOSIS — I10 Essential (primary) hypertension: Secondary | ICD-10-CM | POA: Insufficient documentation

## 2014-02-07 MED ORDER — METOCLOPRAMIDE HCL 10 MG PO TABS: 10.0000 mg | ORAL_TABLET | Freq: Every day | ORAL | Status: DC

## 2014-02-07 MED ORDER — METOCLOPRAMIDE HCL 10 MG PO TABS
10.0000 mg | ORAL_TABLET | Freq: Once | ORAL | Status: AC
  Administered 2014-02-07: 10 mg via ORAL
  Filled 2014-02-07: qty 1

## 2014-02-07 MED ORDER — AMLODIPINE BESYLATE 10 MG PO TABS: 10.0000 mg | ORAL_TABLET | Freq: Every day | ORAL | Status: DC

## 2014-02-07 MED ORDER — ONDANSETRON 4 MG PO TBDP
4.0000 mg | ORAL_TABLET | Freq: Once | ORAL | Status: AC
  Administered 2014-02-07: 4 mg via ORAL
  Filled 2014-02-07: qty 1

## 2014-02-07 MED ORDER — DIPHENHYDRAMINE HCL 25 MG PO CAPS
25.0000 mg | ORAL_CAPSULE | Freq: Once | ORAL | Status: AC
  Administered 2014-02-07: 25 mg via ORAL
  Filled 2014-02-07: qty 1

## 2014-02-07 NOTE — ED Notes (Signed)
MD at bedside. 

## 2014-02-07 NOTE — ED Notes (Signed)
NAD at this time.  

## 2014-02-07 NOTE — Discharge Instructions (Signed)
General Headache Without Cause Natasha Doyle, you were seen today for headache.  Your blood pressure was also high.  Take amlodipine to control your blood pressure and follow up with your regular doctor within one week for continued treatment.  You can take reglan at home as well if your headache returns.  Return to the ED immediately if you have worsening headache or any new symptoms.  Thank you. A headache is pain or discomfort felt around the head or neck area. The specific cause of a headache may not be found. There are many causes and types of headaches. A few common ones are:  Tension headaches.  Migraine headaches.  Cluster headaches.  Chronic daily headaches. HOME CARE INSTRUCTIONS   Keep all follow-up appointments with your caregiver or any specialist referral.  Only take over-the-counter or prescription medicines for pain or discomfort as directed by your caregiver.  Lie down in a dark, quiet room when you have a headache.  Keep a headache journal to find out what may trigger your migraine headaches. For example, write down:  What you eat and drink.  How much sleep you get.  Any change to your diet or medicines.  Try massage or other relaxation techniques.  Put ice packs or heat on the head and neck. Use these 3 to 4 times per day for 15 to 20 minutes each time, or as needed.  Limit stress.  Sit up straight, and do not tense your muscles.  Quit smoking if you smoke.  Limit alcohol use.  Decrease the amount of caffeine you drink, or stop drinking caffeine.  Eat and sleep on a regular schedule.  Get 7 to 9 hours of sleep, or as recommended by your caregiver.  Keep lights dim if bright lights bother you and make your headaches worse. SEEK MEDICAL CARE IF:   You have problems with the medicines you were prescribed.  Your medicines are not working.  You have a change from the usual headache.  You have nausea or vomiting. SEEK IMMEDIATE MEDICAL CARE IF:    Your headache becomes severe.  You have a fever.  You have a stiff neck.  You have loss of vision.  You have muscular weakness or loss of muscle control.  You start losing your balance or have trouble walking.  You feel faint or pass out.  You have severe symptoms that are different from your first symptoms. MAKE SURE YOU:   Understand these instructions.  Will watch your condition.  Will get help right away if you are not doing well or get worse. Document Released: 05/31/2005 Document Revised: 08/23/2011 Document Reviewed: 06/16/2011 [facility] Patient Information 2015 [place], [place]. This information is not intended to replace advice given to you by your health care provider. Make sure you discuss any questions you have with your health care provider.  Hypertension Hypertension is another name for high blood pressure. High blood pressure forces your heart to work harder to pump blood. A blood pressure reading has two numbers, which includes a higher number over a lower number (example: 110/72). HOME CARE   Have your blood pressure rechecked by your doctor.  Only take medicine as told by your doctor. Follow the directions carefully. The medicine does not work as well if you skip doses. Skipping doses also puts you at risk for problems.  Do not smoke.  Monitor your blood pressure at home as told by your doctor. GET HELP IF:  You think you are having a reaction to the  medicine you are taking.  You have repeat headaches or feel dizzy.  You have puffiness (swelling) in your ankles.  You have trouble with your vision. GET HELP RIGHT AWAY IF:   You get a very bad headache and are confused.  You feel weak, numb, or faint.  You get chest or belly (abdominal) pain.  You throw up (vomit).  You cannot breathe very well. MAKE SURE YOU:   Understand these instructions.  Will watch your condition.  Will get help right away if you are not doing well or get  worse. Document Released: 11/17/2007 Document Revised: 06/05/2013 Document Reviewed: 03/23/2013 [facility] Patient Information 2015 [place], [place]. This information is not intended to replace advice given to you by your health care provider. Make sure you discuss any questions you have with your health care provider.     Emergency Department Resource Guide 1) Find a Doctor and Pay Out of Pocket Although you won't have to find out who is covered by your insurance plan, it is a good idea to ask around and get recommendations. You will then need to call the office and see if the doctor you have chosen will accept you as a new patient and what types of options they offer for patients who are self-pay. Some doctors offer discounts or will set up payment plans for their patients who do not have insurance, but you will need to ask so you aren't surprised when you get to your appointment.  2) Contact Your Local Health Department Not all health departments have doctors that can see patients for sick visits, but many do, so it is worth a call to see if yours does. If you don't know where your local health department is, you can check in your phone book. The CDC also has a tool to help you locate your state's health department, and many state websites also have listings of all of their local health departments.  3) Find a Walk-in Clinic If your illness is not likely to be very severe or complicated, you may want to try a walk in clinic. These are popping up all over the country in pharmacies, drugstores, and shopping centers. They're usually staffed by nurse practitioners or physician assistants that have been trained to treat common illnesses and complaints. They're usually fairly quick and inexpensive. However, if you have serious medical issues or chronic medical problems, these are probably not your best option.  No Primary Care Doctor: - Call Health Connect at  [phone] - they can help you locate a  primary care doctor that  accepts your insurance, provides certain services, etc. - Physician Referral Service- [phone]  Chronic Pain Problems: Organization         Address  Phone   Notes  [facility]  [phone] Patients need to be referred by their primary care doctor.   Medication Assistance: Organization         Address  Phone   Notes  [facility] Medication [facility] [address] [place] [place], [place] [address] [phone] --Must be a resident of [facility] -- Must have NO insurance coverage whatsoever (no Medicaid/ Medicare, etc.) -- The pt. MUST have a primary care doctor that directs their care regularly and follows them in the community   [facility]ist  [phone]   [employer]  [phone]    Agencies that provide inexpensive medical care: Retail buyer  Notes  [employer]  [phone]   [facility]    [phone]   [facility] [place], [place] [address] [phone]   [place] [address], [place] [phone]   Planned Parenthood    [phone]   [facility]    [phone]   [place] [address], [place] Phone:  [phone], Fax:  [phone] Hours of Operation:  9 am - 6 pm, M-F.  Also accepts Medicaid/Medicare and self-pay.  [facility] for [address], [address], [place] Phone: [phone], Fax: [phone]. Hours of Operation:  8:30 am - 5:30 pm, M-F.  Also accepts Medicaid and self-pay.  [facility] [place] [address] [place] Phone: [phone]   [place], [place], [place] [phone], Ext. [phone] Mondays & Thursdays: 7-9 AM.  First 15 patients are seen on a first come, first serve basis.    [place]  Providers:  Organization         Address  Phone   Notes  [facility] [address],  [phone] Also accepts self-pay patients.  [facility] [address], [place]  [phone]   [place], [address], [place] [phone]   [facility] [address], [place] [phone]   Lucianne Lei [address], [place]   [phone] Only accepts [place] Access [place] patients after they have their name applied to their card.   Self-Pay (no insurance) in [facility]:  Organization         Address  Phone   Notes  Sickle Cell Patients, [facility] [place] [phone]   [facility] [place] [phone]   [facility] [place]  [place], [place], [place] [phone]   [facility]/Dr. Osei-Bonsu  [address], [place] or [address], [place] [phone] Phone number for both [place] and [place] locations is the same.  [facility] [address], [place] [phone]   [facility] [address] [place] or [address] [phone]   [facility] [address], [place] [phone], phone; [phone], fax Sees patients 1st and 3rd Saturday of every month.  Must not qualify for public or private insurance (i.e. Medicaid, Medicare, [place] Health Choice, Veterans' Benefits)  Household income should be no more than 200% of the poverty level The clinic cannot treat you if you are pregnant or think you are pregnant  Sexually transmitted diseases are not treated at the clinic.    Dental Care: Organization         Address  Phone  Notes  [facility] Department of [facility] [phone] Accepts children up to age 22 who are enrolled in [place] or [place]; pregnant women with a Medicaid card; and children who have applied for Medicaid or [place] Health Choice, but were declined, whose parents can pay a reduced fee at time of service.  [facility]  Department of [facility]  [address], [place] [phone] Accepts children up to age 54 who are enrolled in [place] or [place]; pregnant women with a Medicaid card; and children who have applied for Medicaid or [place] Health Choice, but were declined, whose parents can pay a reduced fee at time of service.  [place] Access PRO[facility]  [place] [phone] Patients are seen by appointment only. Walk-ins are not accepted. [place] will see patients 64 years of age and older. Monday - Tuesday (8am-5pm) Most Wednesdays (8:30-5pm) $30 per visit, cash only  [facility] Adult Dental Access PROGRAM  [address], [place] [facility] [place] [phone] Patients are seen by appointment only. Walk-ins are not accepted. [place] will see patients 44 years of age and older. One Wednesday Evening (Monthly: Volunteer Based).  $30 per visit, cash only  [place]  [phone] for adults; Children under age 33, call Graduate Pediatric Dentistry at [phone]. Children aged 53-14, please call [phone] to request a pediatric application.  Dental services are provided in all areas of dental care including fillings, crowns and bridges, complete and partial dentures, implants, gum treatment, root canals, and extractions. Preventive care is also provided. Treatment is provided to both adults and children. Patients are selected via a lottery and there is often a waiting list.   [facility] [address], [place]  [phone] [email or web address]   [facility]  [address] [place], [place] [phone], Ext. [phone] Second and Fourth Thursday of each month, opens at 6:30 AM; Clinic ends at 9 AM.  Patients are seen on a first-come first-served basis, and a limited number are seen during each clinic.   [facility]  [address] [place] Danker [place], [place] [phone]   Eligibility Requirements You must have lived in [place], [place], or [place] counties for at least the last three months.   You cannot be eligible for state or federal sponsored [employer], including [employer], [place], or [employer].   You generally cannot be eligible for healthcare insurance through your employer.    How to apply: Eligibility screenings are held every Tuesday and Wednesday afternoon from 1:00 pm until 4:00 pm. You do not need an appointment for the interview!  [facility] [address] [place], [place]   [place] Department  [place]  [phone]    Behavioral Health Resources in the Community: Intensive Outpatient Programs Organization         Address  Phone  Notes  [place] [address], [place], [place] [phone]   [facility] [address], [place], [place]   ADS: [facility] [address] [place], [place]   [place] [address],  [place], [place] or [phone]   Substance Abuse Resources Organization         Address  Phone  Notes  Al[facility] Drug Services  [phone]   [place]  [phone]   [facility] [place]   Chinita Pester  [phone]   Residential & Outpatient Substance Abuse Program  [phone]   Psychological Services Organization         Address  Phone  Notes  [place]  [facility]  [phone]    [facility] Services  [phone]   [facility] [address] [place] [phone] or [phone]    Mobile Crisis Teams Organization         Address  Phone  Notes  [facility], Mobile Crisis Care Unit  [phone]   [facility]  [address] [place], [place] [phone]   Doristine Locks [address] [place] [phone]    Self-Help/Support Groups Organization         Address  Phone             Notes  [facility] [place] - variety of support groups  336- I7437963 Call for more information  Narcotics Anonymous (NA), [facility] [address], [employer] [place]  2 meetings at this location   Statistician         Address  Phone  Notes  [facility] [address],    [place]  [phone]   [facility]  [address], [place] [address], [place], [place] [phone]   [facility] [address] [place], [place] [phone] Admissions: 8am-3pm M-F  [facility] [address]    [place], [place] [phone]   [facility] [address] [place], [place], [place] [phone]   [facility] [address],  [place], [place] [phone]   [facility] - Intensive Outpatient [address], [place], [place] [phone]   [facility] (Addiction Recovery [facility] [address] [place].,  [place], [place] [phone] or [phone]   [facility] (RT[facility]) [address] [place], [place] [phone] Accepts Medicaid  [facility] [place] [address]  [place] [phone] Substance Abuse/Addiction Treatment   [facility] Organization         Address  Phone  Notes  [facility]  [phone]   Angie Fava, PhD [address] [place]vin [place], [place]   [phone] or [phone]    [facility] Behavioral   [address] [place], [place] [phone]   [facility] [address] [place], [place] [phone] Insurance/Medicaid/sponsorship through [facility] and Families [address]                                    [place], [place] [phone] Therapy/tele-psych/case  [facility] [address], [place] [phone]    Dr. Lolly Mustache  [phone]   [facility]  [employer] [facility] Dept. 1) [address], [place] 2) [address] [place] 3)  [address], [place] [phone]  [phone]   [facility] Child Abuse Hotline ([phone])[phone] or [phone] (After Hours)

## 2014-02-07 NOTE — ED Notes (Signed)
Pt in c/o headache and n/v for the last few hours, history of migraine and history of HTN, states she is not on BP medication at this time, alert and oriented, no distress noted

## 2014-02-07 NOTE — ED Provider Notes (Signed)
CSN: [id number]     Arrival date & time 02/07/14  0005 History   First MD Initiated Contact with Patient 02/07/14 0142     Chief Complaint  Patient presents with  . Headache     (Consider location/radiation/quality/duration/timing/severity/associated sxs/prior Treatment) HPI  Natasha Doyle is a 48 year old female with no significant past medical history coming in with headache for the past 4 hours. Patient states he was laying in bed when this first occurred. She states it was gradual in onset and has gotten worse over time. He does have a history of headaches that occur when she has an illness. Today she is denying any fevers chills but she did recently recover from a sore throat and subjective fever. Prior to arrival patient took Tylenol, but has not given her any relief. She describes her headache as being on the top and in the back without any radiation. She denies any headache. She does admit to having worsening blurry vision over the past couple of months. She denies any muscle weakness or numbness and tingling.  She has no chest pain shortness of breath abdominal pain diarrhea or changes in her urine. Review systems is only positive for nausea and episode of dry heaving.  Past Medical History  Diagnosis Date  . Hypertension   . Obesity    Past Surgical History  Procedure Laterality Date  . Cholecystectomy     Family History  Problem Relation Age of Onset  . Cancer Mother     breast  . Heart disease Brother   . Hyperlipidemia Brother   . Hypertension Brother    History  Substance Use Topics  . Smoking status: Never Smoker   . Smokeless tobacco: Never Used  . Alcohol Use: Yes     Comment: occ   OB History   Grav Para Term Preterm Abortions TAB SAB Ect Mult Living   0 0 0 0 0 0 2     Review of Systems 10 Systems reviewed and are negative for acute change except as noted in the HPI.    Allergies  Review of patient's allergies indicates no known allergies.  Home  Medications   Prior to Admission medications   Medication Sig Start Date End Date Taking? Authorizing Provider  acetaminophen (MAPAP) 500 MG tablet Take 500 mg by mouth every 6 (six) hours as needed for headache.   Yes Historical Provider, MD   BP 155/66  Pulse 74  Temp(Src) 98.2 F (36.8 C) (Oral)  Resp 18  Ht  (1.626 m)  Wt 256 lb (116.121 kg)  BMI 43.92 kg/m2  SpO2 100%  LMP 09/03/2012 Physical Exam  Nursing note and vitals reviewed. Constitutional: She is oriented to person, place, and time. She appears well-developed and well-nourished. No distress.  Obese female  HENT:  Head: Normocephalic and atraumatic.  Nose: Nose normal.  Mouth/Throat: Oropharynx is clear and moist. No oropharyngeal exudate.  Eyes: Conjunctivae and EOM are normal. Pupils are equal, round, and reactive to light. No scleral icterus.  No papilledema seen. Bilateral vision is 20/20.  Neck: Normal range of motion. Neck supple. No JVD present. No tracheal deviation present. No thyromegaly present.  No meningeal signs.  Cardiovascular: Normal rate, regular rhythm and normal heart sounds.  Exam reveals no gallop and no friction rub.   No murmur heard. Pulmonary/Chest: Effort normal and breath sounds normal. No respiratory distress. She has no wheezes. She exhibits no tenderness.  Abdominal: Soft. Bowel sounds are normal. She exhibits  no distension and no mass. There is no tenderness. There is no rebound and no guarding.  Musculoskeletal: Normal range of motion. She exhibits no edema and no tenderness.  Lymphadenopathy:    She has no cervical adenopathy.  Neurological: She is alert and oriented to person, place, and time. She has normal reflexes. She displays normal reflexes. No cranial nerve deficit. She exhibits normal muscle tone. Coordination normal.  Skin: Skin is warm and dry. No rash noted. No erythema. No pallor.    ED Course  Procedures (including critical care time) Labs Review Labs Reviewed  - No data to display  Imaging Review No results found.   EKG Interpretation None      MDM   Final diagnoses:  None    Patient's this emergency department a concern for her headache. I have low concern that this headache reflection emergent condition. She states it was gradual in onset and she has no associated neurological findings. Patient does not have any fever. Patient had one episode of emesis diminished department it was not witnessed by myself. Patient was treated symptomatically with Reglan Zofran and Benadryl., Repeat assessment patient states that her headache was significantly improved. I did discuss with the patient a combination of headache obesity and blurry vision could lead to the diagnosis of pseudotumor. She is advised for headache and blurry vision persist followup with her primary care doctor for a possible outpatient lumbar puncture. Patient demonstrated understanding of this plan. She'll be discharged with Reglan to use at home for symptomatic treatment.  Patient's blood pressure was elevated to 180 systolic. Upon discharge her blood pressure systolic is 150. Patient was advised to follow with her primary care physician within 2-3 days to have her blood pressure regimen looked at. This could also be the etiology for headache. Since she is not on any blood pressure control she'll be sent home with 10 mg of amlodipine to take. Patient is comfortable in no acute distress. She is safe for discharge.  Tomasita Crumble, MD 02/07/14 [phone]

## 2014-03-20 ENCOUNTER — Encounter (HOSPITAL_COMMUNITY): Payer: Self-pay | Admitting: Emergency Medicine

## 2014-03-20 DIAGNOSIS — R202 Paresthesia of skin: Secondary | ICD-10-CM | POA: Insufficient documentation

## 2014-03-20 DIAGNOSIS — E669 Obesity, unspecified: Secondary | ICD-10-CM | POA: Insufficient documentation

## 2014-03-20 DIAGNOSIS — Z79899 Other long term (current) drug therapy: Secondary | ICD-10-CM | POA: Insufficient documentation

## 2014-03-20 DIAGNOSIS — R200 Anesthesia of skin: Secondary | ICD-10-CM | POA: Diagnosis present

## 2014-03-20 DIAGNOSIS — I10 Essential (primary) hypertension: Secondary | ICD-10-CM | POA: Insufficient documentation

## 2014-03-20 NOTE — ED Notes (Signed)
Pt reports numbness/tingling to R toes and R fingers since yesterday and occasionally L fingers. Stroke scale negative. Pt checked her blood sugar and it was 248- pt has not been dx with diabetes.

## 2014-03-21 ENCOUNTER — Emergency Department (HOSPITAL_COMMUNITY)
Admission: EM | Admit: 2014-03-21 | Discharge: 2014-03-21 | Disposition: A | Discharge status: Home / Self Care | Payer: PRIVATE HEALTH INSURANCE | Attending: Emergency Medicine | Admitting: Emergency Medicine

## 2014-03-21 DIAGNOSIS — R202 Paresthesia of skin: Secondary | ICD-10-CM

## 2014-03-21 LAB — BASIC METABOLIC PANEL
Anion gap: 12 (ref 5–15)
BUN: 9 mg/dL (ref 6–23)
CO2: 25 mEq/L (ref 19–32)
CREATININE: 0.92 mg/dL (ref 0.50–1.10)
Calcium: 9.1 mg/dL (ref 8.4–10.5)
Chloride: 104 mEq/L (ref 96–112)
GFR calc Af Amer: 83 mL/min — ABNORMAL LOW (ref >90)
GFR, EST NON AFRICAN AMERICAN: 72 mL/min — AB (ref >90)
Glucose, Bld: 97 mg/dL (ref 70–99)
POTASSIUM: 4.2 meq/L (ref 3.7–5.3)
Sodium: 141 mEq/L (ref 137–147)

## 2014-03-21 LAB — CBC
HEMATOCRIT: 36.5 % (ref 36.0–46.0)
Hemoglobin: 12.1 g/dL (ref 12.0–15.0)
MCH: 27.1 pg (ref 26.0–34.0)
MCHC: 33.2 g/dL (ref 30.0–36.0)
MCV: 81.7 fL (ref 78.0–100.0)
Platelets: 336 10*3/uL (ref 150–400)
RBC: 4.47 MIL/uL (ref 3.87–5.11)
RDW: 15.4 % (ref 11.5–15.5)
WBC: 5.9 10*3/uL (ref 4.0–10.5)

## 2014-03-21 MED ORDER — ACETAMINOPHEN 325 MG PO TABS
650.0000 mg | ORAL_TABLET | Freq: Once | ORAL | Status: AC
  Administered 2014-03-21: 650 mg via ORAL
  Filled 2014-03-21: qty 2

## 2014-03-21 NOTE — Discharge Instructions (Signed)
We saw you in the ER for the numbness. All the results in the ER are normal. We are not sure what is causing your symptoms. The workup in the ER is not complete, and is limited to screening for life threatening and emergent conditions only, so please see a primary care doctor for further evaluation.   RESOURCE GUIDE  Chronic Pain Problems: Contact [facility]  [phone] Patients need to be referred by their primary care doctor.  Insufficient Money for Medicine: Contact [employer] Way:  call "211."   No Primary Care Doctor: - Call [facility] Connect  [phone] - can help you locate a primary care doctor that  accepts your insurance, provides certain services, etc. - Physician Referral Service- [phone]  Agencies that provide inexpensive medical care: - [facility]  [phone] - [facility]  [phone] - [facility]  [phone] - Women's Clinic  [phone] - [facility]  [phone] [facility]  [phone]  Medicaid-accepting [facility] Providers: - [facility]- [address]  [phone], Mon-Fri 9am-7pm, Sat 9am-1pm - [facility]- [address] [place], [address] [place]  [phone] - [facility]- [address], [address] [place]  [phone] [facility]- [address]  [phone] - Renaye Rakers- [address] [place], [address], [phone]  Only accepts [place] patients after they have their name  applied to their card  Self Pay (no insurance) in [place]: - Sickle Cell Patients: Dr Willey Blade, [facility]  [address] [place], [phone] - [facility]- [address] [place]  [phone]       [facility]- [address]       -     [facility]- see information above (Speak to [employer] if you do not have insurance)       -  [facility]- [address],  [phone]       -  [facility]- [address], [phone]       -  Dr Julio Sicks-  [address], [place], [phone]       -  [facility] - [address] [phone]       -  [facility]- [address] [phone], also [address], [phone]       -    [facility]- [address] [place], [phone], 1st & 3rd Saturday        every month, 10am-1pm  [facility] [address] [place], [place] [address] [phone]  [facility] [address] [place], [place] [address] [phone]  1) Find a Doctor and Pay Out of Pocket Although you won't have to find out who is covered by your insurance plan, it is a good idea to ask around and get recommendations. You will then need to call the office and see if the doctor you have chosen will accept you as a new patient and what types of options they offer for patients who are self-pay. Some doctors offer discounts or will set up payment plans for their patients who do not have insurance, but you will need to ask so you aren't surprised when you get to your  appointment.  2) Contact Your Local Health Department Not all health departments have doctors that can see patients for sick visits, but many do, so it is worth a call to see if yours does. If you don't know where your local health department is, you can check in your phone book. The CDC also has a tool to help you locate your state's health department, and many state websites also have listings of all of their local health departments.  3) Find a Walk-in Clinic If your illness is not likely to be very severe or complicated, you may want to try a walk in clinic. These are popping up all over the country in pharmacies, drugstores, and shopping centers. They're usually staffed by nurse practitioners or physician assistants that have been trained to treat  common illnesses and complaints. They're usually fairly quick and inexpensive. However, if you have serious medical issues or chronic medical problems, these are probably not your best option  STD Testing - [facility] Department of [facility], STD Clinic, [address], [place], phone [phone] or [phone].  Monday - Friday, call for an appointment. [facility] Department of [employer], STD Clinic, [address], [place], phone [phone] or [phone].  Monday - Friday, call for an appointment.  Abuse/Neglect: [facility] Child Abuse Hotline 431-239-4215 [facility] Child Abuse Hotline 3343725157 (After Hours)  Emergency Shelter:  [facility] 9075611472  Maternity Homes: - [facility] [phone] - [facility] [phone]  MRSA Hotline #:   817-420-3274  Dental Assistance If unable to pay or uninsured, contact:  [facility]. to become qualified for the adult dental clinic.  Patients with Medicaid: [facility] [phone] [address], [phone] [address] [phone]  If unable to pay, or uninsured, contact [facility] [phone] in [place], [phone] in [facility]) to become qualified for the adult dental clinic  [facility] [address] [place], [place] [address] [phone] [email or web address]  Other Proofreader Services: - [facility]- [address] [place], [place], [place], [address], [phone], Ext. [phone], 2nd and 4th Thursday of the month at 6:30am.  10 clients each day by appointment, can sometimes see walk-in patients if someone does not show for an appointment. [facility]- [address] [place], [place], [address], [phone] - [facility]- [address] [place], [place], [address], [phone] [facility]  Department- [phone] [facility] Department- [phone] [facility] [phone]         Paresthesia Paresthesia is an abnormal burning or prickling sensation. This sensation is generally felt in the hands, arms, legs, or feet. However, it may occur in any part of the body. It is usually not painful. The feeling may be described as:  Tingling or numbness.  "Pins and needles."  Skin crawling.  Buzzing.  Limbs "falling asleep."  Itching. Most people experience temporary (transient) paresthesia at some time in their lives. CAUSES  Paresthesia may occur when you breathe too quickly (hyperventilation). It can also occur without any apparent cause. Commonly, paresthesia occurs when pressure is placed on a nerve. The feeling quickly goes away once the pressure is removed. For some people, however, paresthesia is a long-lasting (chronic) condition caused by an underlying disorder. The underlying disorder may be:  A traumatic, direct injury to nerves. Examples  include a:  Broken (fractured) neck.  Fractured skull.  A disorder affecting the brain and spinal cord (central nervous system). Examples include:  Transverse myelitis.  Encephalitis.  Transient ischemic attack.  Multiple sclerosis.  Stroke.  Tumor or blood vessel problems, such as an arteriovenous malformation pressing against the brain or spinal cord.  A condition that damages the peripheral nerves (peripheral neuropathy). Peripheral nerves are not part of the brain and spinal cord. These conditions include:  Diabetes.  Peripheral vascular disease.  Nerve entrapment syndromes, such as carpal tunnel syndrome.  Shingles.  Hypothyroidism.  Vitamin B12 deficiencies.  Alcoholism.  Heavy metal poisoning (lead, arsenic).  Rheumatoid arthritis.  Systemic lupus erythematosus. DIAGNOSIS  Your caregiver will attempt to find the underlying cause of your paresthesia. Your caregiver  may:  Take your medical history.  Perform a physical exam.  Order various lab tests.  Order imaging tests. TREATMENT  Treatment for paresthesia depends on the underlying cause. HOME CARE INSTRUCTIONS  Avoid drinking alcohol.  You may consider massage or acupuncture to help relieve your symptoms.  Keep all follow-up appointments as directed by your caregiver. SEEK IMMEDIATE MEDICAL CARE IF:   You feel weak.  You have trouble walking or moving.  You have problems with speech or vision.  You feel confused.  You cannot control your bladder or bowel movements.  You feel numbness after an injury.  You faint.  Your burning or prickling feeling gets worse when walking.  You have pain, cramps, or dizziness.  You develop a rash. MAKE SURE YOU:  Understand these instructions.  Will watch your condition.  Will get help right away if you are not doing well or get worse. Document Released: 05/21/2002 Document Revised: 08/23/2011 Document Reviewed: 02/19/2011 [facility] Patient Information 2015 [employer] [place]. This information is not intended to replace advice given to you by your health care provider. Make sure you discuss any questions you have with your health care provider.

## 2014-03-21 NOTE — ED Provider Notes (Signed)
CSN: [id number]     Arrival date & time 03/20/14  2310 History   First MD Initiated Contact with Patient 03/21/14 0455     Chief Complaint  Patient presents with  . Numbness     (Consider location/radiation/quality/duration/timing/severity/associated sxs/prior Treatment) HPI Comments: Pt comes in with cc of numbness. Sx started on Tuesday, with right foot, distal with toes. Tuesday night, she had numbness to the distal part of the hands with digits 2-5.  Numbness is described as pins and needles. PT has hx of HTN, no other med hx. Not a smoker. No complains on the left side, or the face, and there is no weakness anywhere. No fam hx of vasculitis or autoimmune condition, and the same is true for the patient. Pt denies smoking or substance abuse. Paresthesias are intermittent with no specific aggravating or relieving factors.  The history is provided by the patient.    Past Medical History  Diagnosis Date  . Hypertension   . Obesity    Past Surgical History  Procedure Laterality Date  . Cholecystectomy     Family History  Problem Relation Age of Onset  . Cancer Mother     breast  . Heart disease Brother   . Hyperlipidemia Brother   . Hypertension Brother    History  Substance Use Topics  . Smoking status: Never Smoker   . Smokeless tobacco: Never Used  . Alcohol Use: Yes     Comment: occ   OB History   Grav Para Term Preterm Abortions TAB SAB Ect Mult Living   2 2 2  0 0 0 0 0 0 2     Review of Systems  Constitutional: Negative for activity change.  Respiratory: Negative for shortness of breath.   Cardiovascular: Negative for chest pain.  Gastrointestinal: Negative for nausea, vomiting and abdominal pain.  Genitourinary: Negative for dysuria.  Musculoskeletal: Negative for neck pain.  Neurological: Positive for numbness. Negative for headaches.      Allergies  Review of patient's allergies indicates no known allergies.  Home Medications   Prior to Admission  medications   Medication Sig Start Date End Date Taking? Authorizing Provider  amLODipine (NORVASC) 10 MG tablet Take 1 tablet (10 mg total) by mouth daily. 02/07/14  Yes Tomasita CrumbleAdeleke Oni, MD  Multiple Vitamins-Minerals (GERITABS PO) Take 1 tablet by mouth daily as needed (for energy).   Yes Historical Provider, MD   BP 117/35  Pulse 69  Temp(Src) 98.3 F (36.8 C) (Oral)  Resp 14  SpO2 100%  LMP 09/03/2012 Physical Exam  Constitutional: She is oriented to person, place, and time. She appears well-developed and well-nourished.  HENT:  Head: Normocephalic and atraumatic.  Eyes: EOM are normal. Pupils are equal, round, and reactive to light.  Neck: Neck supple.  Cardiovascular: Normal rate, regular rhythm and normal heart sounds.   No murmur heard. Pulmonary/Chest: Effort normal. No respiratory distress.  Abdominal: Soft. She exhibits no distension. There is no tenderness. There is no rebound and no guarding.  Neurological: She is alert and oriented to person, place, and time. No cranial nerve deficit.  No subjective paresthesias.   Skin: Skin is warm and dry.    ED Course  Procedures (including critical care time) Labs Review Labs Reviewed  BASIC METABOLIC PANEL - Abnormal; Notable for the following:    GFR calc non Af Amer 72 (*)    GFR calc Af Amer 83 (*)    All other components within normal limits  CBC  Imaging Review No results found.   EKG Interpretation None      MDM   Final diagnoses:  Paresthesia    Pt comes in with cc of numbness. Pt has L sided, intermittent paresthesias, affecting primarily fingers and toes. Cap refill normal, skin color is normal. Pt is not a smoker. There is no evidence of Buerger's disease, or septic emboli. No concerns for stroke either, no hard risk factors for that and doest appear to be TIA for the same reason.  PCP f/u advised. No further ER wokup is needed.  Derwood Kaplan, MD 03/21/14 [phone]

## 2014-03-21 NOTE — ED Notes (Signed)
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 States she googled it and is concerned about possible causes. Also family member checked blood sugar and noted it was over 200. See BMP. Pt in NAD, resting comfortably in room, A/O x4 and no other noted neuro deficits.

## 2014-04-15 ENCOUNTER — Encounter (HOSPITAL_COMMUNITY): Payer: Self-pay | Admitting: Emergency Medicine

## 2014-06-14 DIAGNOSIS — Z9884 Bariatric surgery status: Secondary | ICD-10-CM | POA: Insufficient documentation

## 2014-06-17 ENCOUNTER — Emergency Department (HOSPITAL_COMMUNITY): Payer: PRIVATE HEALTH INSURANCE

## 2014-06-17 ENCOUNTER — Emergency Department (HOSPITAL_COMMUNITY)
Admission: EM | Admit: 2014-06-17 | Discharge: 2014-06-17 | Disposition: A | Discharge status: Home / Self Care | Payer: PRIVATE HEALTH INSURANCE | Attending: Emergency Medicine | Admitting: Emergency Medicine

## 2014-06-17 ENCOUNTER — Encounter (HOSPITAL_COMMUNITY): Payer: Self-pay | Admitting: *Deleted

## 2014-06-17 DIAGNOSIS — R05 Cough: Secondary | ICD-10-CM | POA: Diagnosis present

## 2014-06-17 DIAGNOSIS — E669 Obesity, unspecified: Secondary | ICD-10-CM | POA: Diagnosis not present

## 2014-06-17 DIAGNOSIS — R69 Illness, unspecified: Secondary | ICD-10-CM

## 2014-06-17 DIAGNOSIS — I10 Essential (primary) hypertension: Secondary | ICD-10-CM | POA: Insufficient documentation

## 2014-06-17 DIAGNOSIS — Z79899 Other long term (current) drug therapy: Secondary | ICD-10-CM | POA: Diagnosis not present

## 2014-06-17 DIAGNOSIS — J111 Influenza due to unidentified influenza virus with other respiratory manifestations: Secondary | ICD-10-CM

## 2014-06-17 DIAGNOSIS — R059 Cough, unspecified: Secondary | ICD-10-CM

## 2014-06-17 MED ORDER — ACETAMINOPHEN 325 MG PO TABS
650.0000 mg | ORAL_TABLET | Freq: Four times a day (QID) | ORAL | Status: DC | PRN
  Administered 2014-06-17: 650 mg via ORAL
  Filled 2014-06-17: qty 2

## 2014-06-17 MED ORDER — ALBUTEROL SULFATE HFA 108 (90 BASE) MCG/ACT IN AERS
2.0000 | INHALATION_SPRAY | Freq: Once | RESPIRATORY_TRACT | Status: AC
  Administered 2014-06-17: 2 via RESPIRATORY_TRACT
  Filled 2014-06-17: qty 6.7

## 2014-06-17 NOTE — ED Notes (Signed)
Pt reports cold symptoms x 1 week, now has fever, chills, bodyaches, productive cough. Denies n/v/d. Mask on pt at triage.

## 2014-06-17 NOTE — ED Provider Notes (Signed)
CSN: [id number]     Arrival date & time 06/17/14  1559 History   First MD Initiated Contact with Patient 06/17/14 1838     Chief Complaint  Patient presents with  . Cough  . Fever   Patient is a 49 y.o. female presenting with cough and fever. The history is provided by the patient.  Cough Cough characteristics:  Non-productive Severity:  Moderate Onset quality:  Gradual Duration:  1 week Timing:  Intermittent Progression:  Worsening Chronicity:  New Smoker: no   Relieved by:  Nothing Worsened by:  Nothing tried Associated symptoms: chills, fever, myalgias and sore throat   Risk factors: no recent travel   Fever Associated symptoms: chills, cough, myalgias and sore throat   Associated symptoms: no vomiting     Past Medical History  Diagnosis Date  . Hypertension   . Obesity    Past Surgical History  Procedure Laterality Date  . Cholecystectomy     Family History  Problem Relation Age of Onset  . Cancer Mother     breast  . Heart disease Brother   . Hyperlipidemia Brother   . Hypertension Brother    History  Substance Use Topics  . Smoking status: Never Smoker   . Smokeless tobacco: Never Used  . Alcohol Use: Yes     Comment: occ   OB History    Gravida Para Term Preterm AB TAB SAB Ectopic Multiple Living   0 0 0 0 0 0 2     Review of Systems  Constitutional: Positive for fever and chills.  HENT: Positive for sore throat.   Respiratory: Positive for cough.   Gastrointestinal: Negative for vomiting.  Musculoskeletal: Positive for myalgias.  All other systems reviewed and are negative.     Allergies  Review of patient's allergies indicates no known allergies.  Home Medications   Prior to Admission medications   Medication Sig Start Date End Date Taking? Authorizing Provider  lisinopril-hydrochlorothiazide (PRINZIDE,ZESTORETIC) 20-25 MG per tablet Take 1 tablet by mouth daily.   Yes Historical Provider, MD  Multiple Vitamins-Minerals (GERITABS  PO) Take 1 tablet by mouth daily as needed (for energy).   Yes Historical Provider, MD   BP 198/85 mmHg  Pulse 104  Temp(Src) 99.4 F (37.4 C) (Oral)  Resp 18  SpO2 100%  LMP 09/03/2012 Physical Exam CONSTITUTIONAL: Well developed/well nourished HEAD: Normocephalic/atraumatic EYES: EOMI/PERRL ENMT: Mucous membranes moist, uvula midline, no erythema/exudates NECK: supple no meningeal signs SPINE/BACK:entire spine nontender CV: S1/S2 noted, no murmurs/rubs/gallops noted LUNGS: Lungs are clear to auscultation bilaterally, no apparent distress ABDOMEN: soft, nontender, no rebound or guarding, bowel sounds noted throughout abdomen GU:no cva tenderness NEURO: Pt is awake/alert/appropriate, moves all extremitiesx4.  No facial droop.   EXTREMITIES: pulses normal/equal, full ROM SKIN: warm, color normal PSYCH: no abnormalities of mood noted, alert and oriented to situation  ED Course  Procedures   Pt well appearing Suspect viral syndrome/flu like illness She is otherwise healthy/nontoxic and appropriate for outpatient management Imaging Review Dg Chest 2 View  06/17/2014   CLINICAL DATA:  Initial evaluation for fever, cough, body aches for 1 week  EXAM: CHEST  2 VIEW  COMPARISON:  03/05/2013  FINDINGS: The heart size and mediastinal contours are within normal limits. Both lungs are clear. The visualized skeletal structures are unremarkable.  IMPRESSION: No active cardiopulmonary disease.   Electronically Signed   By: Esperanza Heir M.D.   On: 06/17/2014 17:48    Medications  acetaminophen (TYLENOL)  tablet 650 mg (650 mg Oral Given 06/17/14 1628)  albuterol (PROVENTIL HFA;VENTOLIN HFA) 108 (90 BASE) MCG/ACT inhaler 2 puff (2 puffs Inhalation Given 06/17/14 1856)     MDM   Final diagnoses:  Influenza-like illness    Nursing notes including past medical history and social history reviewed and considered in documentation xrays/imaging reviewed by myself and considered during  evaluation     Joya Gaskins, MD 06/17/14 1900

## 2014-06-19 ENCOUNTER — Encounter (HOSPITAL_COMMUNITY): Payer: Self-pay | Admitting: Emergency Medicine

## 2014-06-19 ENCOUNTER — Emergency Department (INDEPENDENT_AMBULATORY_CARE_PROVIDER_SITE_OTHER)
Admission: EM | Admit: 2014-06-19 | Discharge: 2014-06-19 | Disposition: A | Discharge status: Home / Self Care | Payer: PRIVATE HEALTH INSURANCE | Source: Home / Self Care | Attending: Family Medicine | Admitting: Family Medicine

## 2014-06-19 DIAGNOSIS — R509 Fever, unspecified: Secondary | ICD-10-CM

## 2014-06-19 DIAGNOSIS — J020 Streptococcal pharyngitis: Secondary | ICD-10-CM

## 2014-06-19 DIAGNOSIS — E860 Dehydration: Secondary | ICD-10-CM

## 2014-06-19 DIAGNOSIS — R6889 Other general symptoms and signs: Secondary | ICD-10-CM

## 2014-06-19 LAB — POCT URINALYSIS DIP (DEVICE)
Glucose, UA: NEGATIVE mg/dL
HGB URINE DIPSTICK: NEGATIVE
KETONES UR: 40 mg/dL — AB
Leukocytes, UA: NEGATIVE
Nitrite: NEGATIVE
PH: 6.5 (ref 5.0–8.0)
PROTEIN: NEGATIVE mg/dL
Specific Gravity, Urine: 1.02 (ref 1.005–1.030)
Urobilinogen, UA: 4 mg/dL — ABNORMAL HIGH (ref 0.0–1.0)

## 2014-06-19 LAB — POCT RAPID STREP A: Streptococcus, Group A Screen (Direct): POSITIVE — AB

## 2014-06-19 MED ORDER — KETOROLAC TROMETHAMINE 30 MG/ML IJ SOLN
INTRAMUSCULAR | Status: AC
  Filled 2014-06-19: qty 1

## 2014-06-19 MED ORDER — LISINOPRIL-HYDROCHLOROTHIAZIDE 20-25 MG PO TABS: 1.0000 | ORAL_TABLET | Freq: Every day | ORAL | Status: DC

## 2014-06-19 MED ORDER — ONDANSETRON HCL 4 MG/2ML IJ SOLN
INTRAMUSCULAR | Status: AC
  Filled 2014-06-19: qty 2

## 2014-06-19 MED ORDER — AMOXICILLIN 500 MG PO CAPS: 1000.0000 mg | ORAL_CAPSULE | Freq: Two times a day (BID) | ORAL | Status: DC

## 2014-06-19 MED ORDER — KETOROLAC TROMETHAMINE 30 MG/ML IJ SOLN
30.0000 mg | Freq: Once | INTRAMUSCULAR | Status: AC
  Administered 2014-06-19: 30 mg via INTRAVENOUS

## 2014-06-19 MED ORDER — ONDANSETRON HCL 4 MG/2ML IJ SOLN
4.0000 mg | Freq: Once | INTRAMUSCULAR | Status: AC
  Administered 2014-06-19: 4 mg via INTRAMUSCULAR

## 2014-06-19 MED ORDER — ACETAMINOPHEN 325 MG PO TABS
ORAL_TABLET | ORAL | Status: AC
  Filled 2014-06-19: qty 3

## 2014-06-19 MED ORDER — TRAMADOL HCL 50 MG PO TABS: 50.0000 mg | ORAL_TABLET | Freq: Four times a day (QID) | ORAL | Status: DC | PRN

## 2014-06-19 MED ORDER — ONDANSETRON HCL 4 MG PO TABS: 4.0000 mg | ORAL_TABLET | Freq: Four times a day (QID) | ORAL | Status: DC

## 2014-06-19 MED ORDER — SODIUM CHLORIDE 0.9 % IV SOLN: Freq: Once | INTRAVENOUS | Status: DC

## 2014-06-19 NOTE — ED Notes (Signed)
Pt requesting refill on BP medication.  Pt has been with out meds x 2 wks.  Due to not having a primary doctor.  Mw,cma

## 2014-06-19 NOTE — ED Provider Notes (Signed)
CSN: [id number]     Arrival date & time 06/19/14  [id number] History   First MD Initiated Contact with Patient 06/19/14 [phone]     Chief Complaint  Patient presents with  . Influenza   (Consider location/radiation/quality/duration/timing/severity/associated sxs/prior Treatment) HPI Comments: 49 year old obese female is complaining of moderate to severe body aches head to toe, fever, vomiting, decreased appetite, sore throat, runny nose, malaise and fatigue for 48 hours. This occurred one she was at work 2 days ago. She was asked to go home at that time. She went directly to the emergency department and was diagnosed with flulike illness. She was treated with an albuterol inhaler for presumptive bronchospasm causing her cough. She presents today with higher fevers, continuous intractable vomiting and feeling worse in general. She endorses having a flu shot in October 2015.   Past Medical History  Diagnosis Date  . Hypertension   . Obesity    Past Surgical History  Procedure Laterality Date  . Cholecystectomy     Family History  Problem Relation Age of Onset  . Cancer Mother     breast  . Heart disease Brother   . Hyperlipidemia Brother   . Hypertension Brother    History  Substance Use Topics  . Smoking status: Never Smoker   . Smokeless tobacco: Never Used  . Alcohol Use: Yes     Comment: occ   OB History    Gravida Para Term Preterm AB TAB SAB Ectopic Multiple Living   2 2 2  0 0 0 0 0 0 2     Review of Systems  Constitutional: Positive for fever, chills, activity change and appetite change.  HENT: Positive for postnasal drip, rhinorrhea and sore throat. Negative for ear discharge.   Eyes: Negative.   Respiratory: Positive for cough. Negative for chest tightness, shortness of breath and wheezing.   Cardiovascular: Negative.   Gastrointestinal: Positive for nausea and vomiting. Negative for abdominal pain, diarrhea and blood in stool.  Genitourinary: Positive for frequency.  Negative for dysuria.  Musculoskeletal: Positive for myalgias. Negative for neck pain and neck stiffness.  Skin: Negative for rash.  Neurological: Positive for headaches.    Allergies  Review of patient's allergies indicates no known allergies.  Home Medications   Prior to Admission medications   Medication Sig Start Date End Date Taking? Authorizing Provider  amoxicillin (AMOXIL) 500 MG capsule Take 2 capsules (1,000 mg total) by mouth 2 (two) times daily. 06/19/14   Hayden Rasmussenavid Harlow Carrizales, NP  lisinopril-hydrochlorothiazide (PRINZIDE,ZESTORETIC) 20-25 MG per tablet Take 1 tablet by mouth daily.    Historical Provider, MD  Multiple Vitamins-Minerals (GERITABS PO) Take 1 tablet by mouth daily as needed (for energy).    Historical Provider, MD  ondansetron (ZOFRAN) 4 MG tablet Take 1 tablet (4 mg total) by mouth every 6 (six) hours. 06/19/14   Hayden Rasmussenavid Amberlyn Martinezgarcia, NP  traMADol (ULTRAM) 50 MG tablet Take 1 tablet (50 mg total) by mouth every 6 (six) hours as needed. For aches and pain 06/19/14   Hayden Rasmussenavid Edmund Holcomb, NP   BP 185/97 mmHg  Pulse 103  Temp(Src) 102.2 F (39 C) (Oral)  Resp 18  SpO2 96%  LMP 09/03/2012 Physical Exam  Constitutional: She is oriented to person, place, and time. She appears well-developed and well-nourished. No distress.  HENT:  Bilateral TMs are normal Oropharynx with erythema, enlarged beefy tonsils with exudate.  Eyes: Conjunctivae and EOM are normal.  Neck: Normal range of motion. Neck supple.  Cardiovascular: Normal rate, regular rhythm and  normal heart sounds.   Pulmonary/Chest: Effort normal and breath sounds normal. She has no wheezes. She has no rales.  Abdominal: Soft. She exhibits no distension. There is no tenderness. There is no rebound.  Musculoskeletal: Normal range of motion.  Lymphadenopathy:    She has no cervical adenopathy.  Neurological: She is alert and oriented to person, place, and time. She exhibits normal muscle tone.  Skin: Skin is warm and dry. No rash noted.   Psychiatric: She has a normal mood and affect.  Nursing note and vitals reviewed.   ED Course  Procedures (including critical care time) Labs Review Labs Reviewed  POCT RAPID STREP A ([facility] CARE ONLY) - Abnormal; Notable for the following:    Streptococcus, Group A Screen (Direct) POSITIVE (*)    All other components within normal limits  POCT URINALYSIS DIP (DEVICE) - Abnormal; Notable for the following:    Bilirubin Urine SMALL (*)    Ketones, ur 40 (*)    Urobilinogen, UA 4.0 (*)    All other components within normal limits    Imaging Review Dg Chest 2 View  06/17/2014   CLINICAL DATA:  Initial evaluation for fever, cough, body aches for 1 week  EXAM: CHEST  2 VIEW  COMPARISON:  03/05/2013  FINDINGS: The heart size and mediastinal contours are within normal limits. Both lungs are clear. The visualized skeletal structures are unremarkable.  IMPRESSION: No active cardiopulmonary disease.   Electronically Signed   By: Esperanza Heir M.D.   On: 06/17/2014 17:48     MDM   1. Strep pharyngitis   2. Flu-like symptoms   3. Other specified fever   4. Dehydration     Suspect flu like illness associated with strep pharyngitis IV NS zofran 4 mg IM. Rx for same #12 Toradol 30 mg IV Strep Pos Pt received 2 L NS, APAP  po, and above meds. St feels much better. Temp decreased.  Tramadol 50 mg #12 Clear liquids Amoxicillin 1 gm bid RF BP med zestoretic 20/25 q d #30 NR       Hayden Rasmussen, NP 06/19/14 1133

## 2014-06-19 NOTE — ED Notes (Signed)
Patient still unable to provide urine speciment

## 2014-06-19 NOTE — ED Notes (Signed)
Reports being dx with flu on Monday.  Pt states symptoms have gotten worse.   Vomiting.  Body aches.  Fever.  Weakness.     Last dose of ibuprofen was at 6:30 a.m this morning.

## 2014-06-19 NOTE — ED Notes (Signed)
Patient can't void at this time 

## 2014-06-19 NOTE — Discharge Instructions (Signed)
Dehydration, Adult Dehydration means your body does not have as much fluid as it needs. Your kidneys, brain, and heart will not work properly without the right amount of fluids and salt.  HOME CARE  Ask your doctor how to replace body fluid losses (rehydrate).  Drink enough fluids to keep your pee (urine) clear or pale yellow.  Drink small amounts of fluids often if you feel sick to your stomach (nauseous) or throw up (vomit).  Eat like you normally do.  Avoid:  Foods or drinks high in sugar.  Bubbly (carbonated) drinks.  Juice.  Very hot or cold fluids.  Drinks with caffeine.  Fatty, greasy foods.  Alcohol.  Tobacco.  Eating too much.  Gelatin desserts.  Wash your hands to avoid spreading germs (bacteria, viruses).  Only take medicine as told by your doctor.  Keep all doctor visits as told. GET HELP RIGHT AWAY IF:   You cannot drink something without throwing up.  You get worse even with treatment.  Your vomit has blood in it or looks greenish.  Your poop (stool) has blood in it or looks black and tarry.  You have not peed in 6 to 8 hours.  You pee a small amount of very dark pee.  You have a fever.  You pass out (faint).  You have belly (abdominal) pain that gets worse or stays in one spot (localizes).  You have a rash, stiff neck, or bad headache.  You get easily annoyed, sleepy, or are hard to wake up.  You feel weak, dizzy, or very thirsty. MAKE SURE YOU:   Understand these instructions.  Will watch your condition.  Will get help right away if you are not doing well or get worse. Document Released: 03/27/2009 Document Revised: 08/23/2011 Document Reviewed: 01/18/2011 [facility] Patient Information 2015 [place], [place]. This information is not intended to replace advice given to you by your health care provider. Make sure you discuss any questions you have with your health care provider.  Fever, Adult A fever is a higher than normal body  temperature. In an adult, an oral temperature around 98.6 F (37 C) is considered normal. A temperature of 100.4 F (38 C) or higher is generally considered a fever. Mild or moderate fevers generally have no long-term effects and often do not require treatment. Extreme fever (greater than or equal to 106 F or 41.1 C) can cause seizures. The sweating that may occur with repeated or prolonged fever may cause dehydration. Elderly people can develop confusion during a fever. A measured temperature can vary with:  Age.  Time of day.  Method of measurement (mouth, underarm, rectal, or ear). The fever is confirmed by taking a temperature with a thermometer. Temperatures can be taken different ways. Some methods are accurate and some are not.  An oral temperature is used most commonly. Electronic thermometers are fast and accurate.  An ear temperature will only be accurate if the thermometer is positioned as recommended by the manufacturer.  A rectal temperature is accurate and done for those adults who have a condition where an oral temperature cannot be taken.  An underarm (axillary) temperature is not accurate and not recommended. Fever is a symptom, not a disease.  CAUSES   Infections commonly cause fever.  Some noninfectious causes for fever include:  Some arthritis conditions.  Some thyroid or adrenal gland conditions.  Some immune system conditions.  Some types of cancer.  A medicine reaction.  High doses of certain street drugs such as  methamphetamine.  Dehydration.  Exposure to high outside or room temperatures.  Occasionally, the source of a fever cannot be determined. This is sometimes called a "fever of unknown origin" (FUO).  Some situations may lead to a temporary rise in body temperature that may go away on its own. Examples are:  Childbirth.  Surgery.  Intense exercise. HOME CARE INSTRUCTIONS   Take appropriate medicines for fever. Follow dosing  instructions carefully. If you use acetaminophen to reduce the fever, be careful to avoid taking other medicines that also contain acetaminophen. Do not take aspirin for a fever if you are younger than age 12. There is an association with Reye's syndrome. Reye's syndrome is a rare but potentially deadly disease.  If an infection is present and antibiotics have been prescribed, take them as directed. Finish them even if you start to feel better.  Rest as needed.  Maintain an adequate fluid intake. To prevent dehydration during an illness with prolonged or recurrent fever, you may need to drink extra fluid.Drink enough fluids to keep your urine clear or pale yellow.  Sponging or bathing with room temperature water may help reduce body temperature. Do not use ice water or alcohol sponge baths.  Dress comfortably, but do not over-bundle. SEEK MEDICAL CARE IF:   You are unable to keep fluids down.  You develop vomiting or diarrhea.  You are not feeling at least partly better after 3 days.  You develop new symptoms or problems. SEEK IMMEDIATE MEDICAL CARE IF:   You have shortness of breath or trouble breathing.  You develop excessive weakness.  You are dizzy or you faint.  You are extremely thirsty or you are making little or no urine.  You develop new pain that was not there before (such as in the head, neck, chest, back, or abdomen).  You have persistent vomiting and diarrhea for more than 1 to 2 days.  You develop a stiff neck or your eyes become sensitive to light.  You develop a skin rash.  You have a fever or persistent symptoms for more than 2 to 3 days.  You have a fever and your symptoms suddenly get worse. MAKE SURE YOU:   Understand these instructions.  Will watch your condition.  Will get help right away if you are not doing well or get worse. Document Released: 11/24/2000 Document Revised: 10/15/2013 Document Reviewed: 04/01/2011 [facility] Patient Information  2015 [place], [place]. This information is not intended to replace advice given to you by your health care provider. Make sure you discuss any questions you have with your health care provider.  Influenza Influenza (flu) is an infection in the mouth, nose, and throat (respiratory tract) caused by a virus. The flu can make you feel very ill. Influenza spreads easily from person to person (contagious).  HOME CARE   Only take medicines as told by your doctor.  Use a cool mist humidifier to make breathing easier.  Get plenty of rest until your fever goes away. This usually takes 3 to 4 days.  Drink enough fluids to keep your pee (urine) clear or pale yellow.  Cover your mouth and nose when you cough or sneeze.  Wash your hands well to avoid spreading the flu.  Stay home from work or school until your fever has been gone for at least 1 full day.  Get a flu shot every year. GET HELP RIGHT AWAY IF:   You have trouble breathing or feel short of breath.  Your skin or nails  turn blue.  You have severe neck pain or stiffness.  You have a severe headache, facial pain, or earache.  Your fever gets worse or keeps coming back.  You feel sick to your stomach (nauseous), throw up (vomit), or have watery poop (diarrhea).  You have chest pain.  You have a deep cough that gets worse, or you cough up more thick spit (mucus). MAKE SURE YOU:   Understand these instructions.  Will watch your condition.  Will get help right away if you are not doing well or get worse. Document Released: 03/09/2008 Document Revised: 10/15/2013 Document Reviewed: 08/30/2011 [facility] Patient Information 2015 [place]. This information is not intended to replace advice given to you by your health care provider. Make sure you discuss any questions you have with your health care provider.  Rehydration, Adult Rehydration is the replacement of body fluids lost during dehydration. Dehydration is an extreme loss  of body fluids to the point of body function impairment. There are many ways extreme fluid loss can occur, including vomiting, diarrhea, or excess sweating. Recovering from dehydration requires replacing lost fluids, continuing to eat to maintain strength, and avoiding foods and beverages that may contribute to further fluid loss or may increase nausea. HOW TO REHYDRATE In most cases, rehydration involves the replacement of not only fluids but also carbohydrates and basic body salts. Rehydration with an oral rehydration solution is one way to replace essential nutrients lost through dehydration. An oral rehydration solution can be purchased at pharmacies, retail stores, and online. Premixed packets of powder that you combine with water to make a solution are also sold. You can prepare an oral rehydration solution at home by mixing the following ingredients together:    - tsp table salt.   tsp baking soda.   tsp salt substitute containing potassium chloride.  1 tablespoons sugar.  1 L (34 oz) of water. Be sure to use exact measurements. Including too much sugar can make diarrhea worse. Drink -1 cup (120-240 mL) of oral rehydration solution each time you have diarrhea or vomit. If drinking this amount makes your vomiting worse, try drinking smaller amounts more often. For example, drink 1-3 tsp every 5-10 minutes.  A general rule for staying hydrated is to drink 1-2 L of fluid per day. Talk to your caregiver about the specific amount you should be drinking each day. Drink enough fluids to keep your urine clear or pale yellow. EATING WHEN DEHYDRATED Even if you have had severe sweating or you are having diarrhea, do not stop eating. Many healthy items in a normal diet are okay to continue eating while recovering from dehydration. The following tips can help you to lessen nausea when you eat:  Ask someone else to prepare your food. Cooking smells may worsen nausea.  Eat in a well-ventilated room  away from cooking smells.  Sit up when you eat. Avoid lying down until 1-2 hours after eating.  Eat small amounts when you eat.  Eat foods that are easy to digest. These include soft, well-cooked, or mashed foods. FOODS AND BEVERAGES TO AVOID Avoid eating or drinking the following foods and beverages that may increase nausea or further loss of fluid:   Fruit juices with a high sugar content, such as concentrated juices.  Alcohol.  Beverages containing caffeine.  Carbonated drinks. They may cause a lot of gas.  Foods that may cause a lot of gas, such as cabbage, broccoli, and beans.  Fatty, greasy, and fried foods.  Spicy, very  salty, and very sweet foods or drinks.  Foods or drinks that are very hot or very cold. Consume food or drinks at or near room temperature.  Foods that need a lot of chewing, such as raw vegetables.  Foods that are sticky or hard to swallow, such as peanut butter. Document Released: 08/23/2011 Document Revised: 02/23/2012 Document Reviewed: 08/23/2011 [facility] Patient Information 2015 [place], [place]. This information is not intended to replace advice given to you by your health care provider. Make sure you discuss any questions you have with your health care provider.  Strep Throat Strep throat is an infection of the throat caused by a bacteria named Streptococcus pyogenes. Your health care provider may call the infection streptococcal "tonsillitis" or "pharyngitis" depending on whether there are signs of inflammation in the tonsils or back of the throat. Strep throat is most common in children aged 5-15 years during the cold months of the year, but it can occur in people of any age during any season. This infection is spread from person to person (contagious) through coughing, sneezing, or other close contact. SIGNS AND SYMPTOMS   Fever or chills.  Painful, swollen, red tonsils or throat.  Pain or difficulty when swallowing.  White or yellow spots on  the tonsils or throat.  Swollen, tender lymph nodes or "glands" of the neck or under the jaw.  Red rash all over the body (rare). DIAGNOSIS  Many different infections can cause the same symptoms. A test must be done to confirm the diagnosis so the right treatment can be given. A "rapid strep test" can help your health care provider make the diagnosis in a few minutes. If this test is not available, a light swab of the infected area can be used for a throat culture test. If a throat culture test is done, results are usually available in a day or two. TREATMENT  Strep throat is treated with antibiotic medicine. HOME CARE INSTRUCTIONS   Gargle with 1 tsp of salt in 1 cup of warm water, 3-4 times per day or as needed for comfort.  Family members who also have a sore throat or fever should be tested for strep throat and treated with antibiotics if they have the strep infection.  Make sure everyone in your household washes their hands well.  Do not share food, drinking cups, or personal items that could cause the infection to spread to others.  You may need to eat a soft food diet until your sore throat gets better.  Drink enough water and fluids to keep your urine clear or pale yellow. This will help prevent dehydration.  Get plenty of rest.  Stay home from school, day care, or work until you have been on antibiotics for 24 hours.  Take medicines only as directed by your health care provider.  Take your antibiotic medicine as directed by your health care provider. Finish it even if you start to feel better. SEEK MEDICAL CARE IF:   The glands in your neck continue to enlarge.  You develop a rash, cough, or earache.  You cough up green, yellow-brown, or bloody sputum.  You have pain or discomfort not controlled by medicines.  Your problems seem to be getting worse rather than better.  You have a fever. SEEK IMMEDIATE MEDICAL CARE IF:   You develop any new symptoms such as  vomiting, severe headache, stiff or painful neck, chest pain, shortness of breath, or trouble swallowing.  You develop severe throat pain, drooling, or changes in  your voice.  You develop swelling of the neck, or the skin on the neck becomes red and tender.  You develop signs of dehydration, such as fatigue, dry mouth, and decreased urination.  You become increasingly sleepy, or you cannot wake up completely. MAKE SURE YOU:  Understand these instructions.  Will watch your condition.  Will get help right away if you are not doing well or get worse. Document Released: 05/28/2000 Document Revised: 10/15/2013 Document Reviewed: 07/30/2010 [facility] Patient Information 2015 [place], [place]. This information is not intended to replace advice given to you by your health care provider. Make sure you discuss any questions you have with your health care provider.

## 2014-08-23 ENCOUNTER — Emergency Department (INDEPENDENT_AMBULATORY_CARE_PROVIDER_SITE_OTHER): Payer: PRIVATE HEALTH INSURANCE

## 2014-08-23 ENCOUNTER — Emergency Department (INDEPENDENT_AMBULATORY_CARE_PROVIDER_SITE_OTHER)
Admission: EM | Admit: 2014-08-23 | Discharge: 2014-08-23 | Disposition: A | Discharge status: Home / Self Care | Payer: PRIVATE HEALTH INSURANCE | Source: Home / Self Care | Attending: Emergency Medicine | Admitting: Emergency Medicine

## 2014-08-23 ENCOUNTER — Encounter (HOSPITAL_COMMUNITY): Payer: Self-pay | Admitting: Emergency Medicine

## 2014-08-23 DIAGNOSIS — J209 Acute bronchitis, unspecified: Secondary | ICD-10-CM

## 2014-08-23 MED ORDER — HYDROCOD POLST-CHLORPHEN POLST 10-8 MG/5ML PO LQCR: 5.0000 mL | Freq: Two times a day (BID) | ORAL | Status: DC | PRN

## 2014-08-23 MED ORDER — IPRATROPIUM-ALBUTEROL 0.5-2.5 (3) MG/3ML IN SOLN
RESPIRATORY_TRACT | Status: AC
  Filled 2014-08-23: qty 3

## 2014-08-23 MED ORDER — IPRATROPIUM-ALBUTEROL 0.5-2.5 (3) MG/3ML IN SOLN
3.0000 mL | Freq: Once | RESPIRATORY_TRACT | Status: AC
  Administered 2014-08-23: 3 mL via RESPIRATORY_TRACT

## 2014-08-23 MED ORDER — AZITHROMYCIN 250 MG PO TABS: ORAL_TABLET | ORAL | Status: DC

## 2014-08-23 MED ORDER — PREDNISONE 10 MG PO TABS: ORAL_TABLET | ORAL | Status: DC

## 2014-08-23 MED ORDER — ALBUTEROL SULFATE HFA 108 (90 BASE) MCG/ACT IN AERS: 2.0000 | INHALATION_SPRAY | RESPIRATORY_TRACT | Status: DC | PRN

## 2014-08-23 NOTE — ED Provider Notes (Signed)
CSN: [id number]     Arrival date & time 08/23/14  1636 History   First MD Initiated Contact with Patient 08/23/14 1729     Chief Complaint  Patient presents with  . Cough  . Generalized Body Aches   (Consider location/radiation/quality/duration/timing/severity/associated sxs/prior Treatment) HPI         Pt presents for eval of cough, congestion, chest tightness, wheezing, shortness of breath, maxillary sinus pressure. This initially started on Tuesday 4 days ago and has gotten gradually worse. It is very minor at the beginning. The cough started the second day. He became productive on the third day. This morning she started to have the subjective shortness of breath and fevers. No recent travel or sick contacts. She had influenza 2 months ago. She got her flu shot this year.  Past Medical History  Diagnosis Date  . Hypertension   . Obesity    Past Surgical History  Procedure Laterality Date  . Cholecystectomy     Family History  Problem Relation Age of Onset  . Cancer Mother     breast  . Heart disease Brother   . Hyperlipidemia Brother   . Hypertension Brother    History  Substance Use Topics  . Smoking status: Never Smoker   . Smokeless tobacco: Never Used  . Alcohol Use: Yes     Comment: occ   OB History    Gravida Para Term Preterm AB TAB SAB Ectopic Multiple Living   0 0 0 0 0 0 2     Review of Systems  Constitutional: Positive for fever, chills and fatigue. Negative for unexpected weight change.  HENT: Positive for congestion, sinus pressure and sore throat. Negative for hearing loss.   Eyes: Negative for visual disturbance.  Respiratory: Positive for cough, chest tightness, shortness of breath and wheezing.   Cardiovascular: Negative for chest pain and leg swelling.  Gastrointestinal: Negative for nausea, vomiting and diarrhea.  Skin: Negative for wound.  All other systems reviewed and are negative.   Allergies  Review of patient's allergies indicates  no known allergies.  Home Medications   Prior to Admission medications   Medication Sig Start Date End Date Taking? Authorizing Provider  lisinopril-hydrochlorothiazide (ZESTORETIC) 20-25 MG per tablet Take 1 tablet by mouth daily. 06/19/14  Yes Hayden Rasmussen, NP  albuterol (PROVENTIL HFA;VENTOLIN HFA) 108 (90 BASE) MCG/ACT inhaler Inhale 2 puffs into the lungs every 4 (four) hours as needed for wheezing. 08/23/14   Graylon Good, PA-C  amoxicillin (AMOXIL) 500 MG capsule Take 2 capsules (1,000 mg total) by mouth 2 (two) times daily. 06/19/14   Hayden Rasmussen, NP  azithromycin (ZITHROMAX Z-PAK) 250 MG tablet Use as directed 08/23/14   Graylon Good, PA-C  chlorpheniramine-HYDROcodone (TUSSIONEX PENNKINETIC ER) 10-8 MG/5ML LQCR Take 5 mLs by mouth every 12 (twelve) hours as needed for cough. 08/23/14   Adrian Blackwater Philip Kotlyar, PA-C  Multiple Vitamins-Minerals (GERITABS PO) Take 1 tablet by mouth daily as needed (for energy).    Historical Provider, MD  ondansetron (ZOFRAN) 4 MG tablet Take 1 tablet (4 mg total) by mouth every 6 (six) hours. 06/19/14   Hayden Rasmussen, NP  predniSONE (DELTASONE) 10 MG tablet 4 tabs PO QD for 4 days; 3 tabs PO QD for 3 days; 2 tabs PO QD for 2 days; 1 tab PO QD for 1 day 08/23/14   Graylon Good, PA-C  traMADol (ULTRAM) 50 MG tablet Take 1 tablet (50 mg total) by mouth every 6 (  six) hours as needed. For aches and pain 06/19/14   Hayden Rasmussenavid Mabe, NP   BP 157/85 mmHg  Pulse 98  Temp(Src) 99.9 F (37.7 C) (Oral)  Resp 20  SpO2 100%  LMP 09/03/2012 Physical Exam  Constitutional: She is oriented to person, place, and time. Vital signs are normal. She appears well-developed and well-nourished. No distress.  HENT:  Head: Normocephalic and atraumatic.  Right Ear: External ear normal.  Left Ear: External ear normal.  Nose: Nose normal.  Mouth/Throat: Oropharynx is clear and moist. No oropharyngeal exudate.  Eyes: Conjunctivae are normal.  Neck: Normal range of motion. Neck supple. No tracheal  deviation present.  Cardiovascular: Normal rate, regular rhythm and normal heart sounds.   Pulmonary/Chest: Effort normal. No respiratory distress. She has wheezes (mild, diffuse, expiratory). She has no rales.  Lymphadenopathy:    She has no cervical adenopathy.  Neurological: She is alert and oriented to person, place, and time. She has normal strength. Coordination normal.  Skin: Skin is warm and dry. No rash noted. She is not diaphoretic.  Psychiatric: She has a normal mood and affect. Judgment normal.  Nursing note and vitals reviewed.   ED Course  Procedures (including critical care time) Labs Review Labs Reviewed - No data to display  Imaging Review Dg Chest 2 View  08/23/2014   CLINICAL DATA:  Chest tightness, body aches, and fever to 101.5 degrees, coughing up green stuff, history hypertension  EXAM: CHEST  2 VIEW  COMPARISON:  06/17/2014  FINDINGS: Normal heart size, mediastinal contours, and pulmonary vascularity.  Lungs clear.  No pneumothorax.  Bones unremarkable.  IMPRESSION: Normal exam.   Electronically Signed   By: Ulyses SouthwardMark  Boles M.D.   On: 08/23/2014 18:06     MDM   1. Acute bronchitis, unspecified organism    Treat symptomatically. Due to the progressively worsening course, treat with azithromycin as well. Her chest x-ray is normal which is reassuring. She is instructed to follow-up if she gets any worse.  Meds ordered this encounter  Medications  . ipratropium-albuterol (DUONEB) 0.5-2.5 (3) MG/3ML nebulizer solution 3 mL    Sig:   . chlorpheniramine-HYDROcodone (TUSSIONEX PENNKINETIC ER) 10-8 MG/5ML LQCR    Sig: Take 5 mLs by mouth every 12 (twelve) hours as needed for cough.    Dispense:  115 mL    Refill:  0  . predniSONE (DELTASONE) 10 MG tablet    Sig: 4 tabs PO QD for 4 days; 3 tabs PO QD for 3 days; 2 tabs PO QD for 2 days; 1 tab PO QD for 1 day    Dispense:  30 tablet    Refill:  0  . albuterol (PROVENTIL HFA;VENTOLIN HFA) 108 (90 BASE) MCG/ACT inhaler     Sig: Inhale 2 puffs into the lungs every 4 (four) hours as needed for wheezing.    Dispense:  1 Inhaler    Refill:  0  . azithromycin (ZITHROMAX Z-PAK) 250 MG tablet    Sig: Use as directed    Dispense:  6 each    Refill:  0       Graylon GoodZachary H Raena Pau, PA-C 08/23/14 1813

## 2014-08-23 NOTE — ED Notes (Signed)
Pt states that she has been sick since 08/18/2014 with cough body aches and congestion

## 2014-08-23 NOTE — Discharge Instructions (Signed)

## 2014-09-26 ENCOUNTER — Other Ambulatory Visit: Payer: Self-pay | Admitting: General Surgery

## 2014-10-21 ENCOUNTER — Encounter: Payer: Self-pay | Admitting: Cardiology

## 2014-10-22 ENCOUNTER — Encounter (HOSPITAL_COMMUNITY)
Admission: RE | Disposition: A | Discharge status: Home / Self Care | Payer: Self-pay | Source: Ambulatory Visit | Attending: General Surgery

## 2014-10-22 ENCOUNTER — Other Ambulatory Visit: Payer: Self-pay

## 2014-10-22 ENCOUNTER — Ambulatory Visit (HOSPITAL_COMMUNITY)
Admission: RE | Admit: 2014-10-22 | Discharge: 2014-10-22 | Disposition: A | Discharge status: Home / Self Care | Payer: 59 | Source: Ambulatory Visit | Attending: General Surgery | Admitting: General Surgery

## 2014-10-22 HISTORY — PX: BREATH TEK H PYLORI: SHX5422

## 2014-10-22 SURGERY — BREATH TEST, FOR HELICOBACTER PYLORI

## 2014-10-22 NOTE — Progress Notes (Signed)
   10/22/14 0836  BREATH TEK ASSESSMENT  Referring MD eric wilson  Time of Last PO Intake 2245  Baseline Breath At: 0715  Pranactin Given At: 0715  Post-Dose Breath At: 0730  Sample 1 4.5%  Sample 2 2.3%  Test Negative

## 2014-10-23 ENCOUNTER — Encounter (HOSPITAL_COMMUNITY): Payer: Self-pay | Admitting: General Surgery

## 2014-11-14 ENCOUNTER — Ambulatory Visit: Payer: PRIVATE HEALTH INSURANCE | Admitting: Cardiology

## 2014-11-20 ENCOUNTER — Ambulatory Visit: Payer: PRIVATE HEALTH INSURANCE | Admitting: Dietician

## 2014-11-22 ENCOUNTER — Emergency Department (HOSPITAL_COMMUNITY)
Admission: EM | Admit: 2014-11-22 | Discharge: 2014-11-22 | Disposition: A | Discharge status: Home / Self Care | Payer: 59 | Attending: Emergency Medicine | Admitting: Emergency Medicine

## 2014-11-22 ENCOUNTER — Encounter (HOSPITAL_COMMUNITY): Payer: Self-pay | Admitting: Emergency Medicine

## 2014-11-22 DIAGNOSIS — E119 Type 2 diabetes mellitus without complications: Secondary | ICD-10-CM | POA: Diagnosis not present

## 2014-11-22 DIAGNOSIS — E669 Obesity, unspecified: Secondary | ICD-10-CM | POA: Insufficient documentation

## 2014-11-22 DIAGNOSIS — I10 Essential (primary) hypertension: Secondary | ICD-10-CM | POA: Insufficient documentation

## 2014-11-22 DIAGNOSIS — Z79899 Other long term (current) drug therapy: Secondary | ICD-10-CM | POA: Insufficient documentation

## 2014-11-22 DIAGNOSIS — Y999 Unspecified external cause status: Secondary | ICD-10-CM | POA: Insufficient documentation

## 2014-11-22 DIAGNOSIS — S3992XA Unspecified injury of lower back, initial encounter: Secondary | ICD-10-CM | POA: Diagnosis present

## 2014-11-22 DIAGNOSIS — Y939 Activity, unspecified: Secondary | ICD-10-CM | POA: Diagnosis not present

## 2014-11-22 DIAGNOSIS — Z792 Long term (current) use of antibiotics: Secondary | ICD-10-CM | POA: Diagnosis not present

## 2014-11-22 DIAGNOSIS — X58XXXA Exposure to other specified factors, initial encounter: Secondary | ICD-10-CM | POA: Diagnosis not present

## 2014-11-22 DIAGNOSIS — S39012A Strain of muscle, fascia and tendon of lower back, initial encounter: Secondary | ICD-10-CM

## 2014-11-22 DIAGNOSIS — Y929 Unspecified place or not applicable: Secondary | ICD-10-CM | POA: Diagnosis not present

## 2014-11-22 MED ORDER — METHOCARBAMOL 500 MG PO TABS: 500.0000 mg | ORAL_TABLET | Freq: Two times a day (BID) | ORAL | Status: DC

## 2014-11-22 MED ORDER — TRAMADOL HCL 50 MG PO TABS
50.0000 mg | ORAL_TABLET | Freq: Four times a day (QID) | ORAL | Status: DC | PRN
Start: 2014-11-22 — End: 2014-12-09

## 2014-11-22 MED ORDER — MELOXICAM 15 MG PO TABS: 15.0000 mg | ORAL_TABLET | Freq: Every day | ORAL | Status: DC

## 2014-11-22 NOTE — ED Notes (Signed)
Pt. reports low back pain onset yesterday , denies injury or fall , ambulatory , no dysuria or hematuria .

## 2014-11-22 NOTE — Discharge Instructions (Signed)
Take mobic as directed for inflammation. Take tramadol as needed for pain. Take Robaxin as needed for muscle spasm. You may take these medications together. Refer to attached documents for more information.

## 2014-11-22 NOTE — ED Provider Notes (Signed)
CSN: [id number]     Arrival date & time 11/22/14  0048 History   First MD Initiated Contact with Patient 11/22/14 0105     Chief Complaint  Patient presents with  . Back Pain     (Consider location/radiation/quality/duration/timing/severity/associated sxs/prior Treatment) HPI Comments: CC: Back pain  HPI: Natasha Doyle is a 49 year old African American female who presents to the ED with a 2 day history of lower back pain.  Pain also present down lateral side of right thigh and behind right knee.  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  Patient tried ibuprofen and Tylenol for pain relief with minimal effect.  Describes pain as a chronic ache that starts in the middle of her lower back and radiates to both sides.  Pain exacerbated by movement.  No weakness, numbness, tingling, or other neurological deficits noted.  No fever, sweats, chills, or loss of bowel or bladder function.   Past Medical History  Diagnosis Date  . Hypertension   . Obesity   . Diabetes mellitus without complication    Past Surgical History  Procedure Laterality Date  . Cholecystectomy    . Breath tek h pylori N/A 10/22/2014    Procedure: BREATH TEK H PYLORI;  Surgeon: Gaynelle Adu, MD;  Location: [facility];  Service: General;  Laterality: N/A;   Family History  Problem Relation Age of Onset  . Cancer Mother     breast  . Heart disease Brother   . Hyperlipidemia Brother   . Hypertension Brother    History  Substance Use Topics  . Smoking status: Never Smoker   . Smokeless tobacco: Never Used  . Alcohol Use: Yes     Comment: occ   OB History    Gravida Para Term Preterm AB TAB SAB Ectopic Multiple Living   0 0 0 0 0 0 2     Review of Systems  Musculoskeletal: Positive for myalgias, back pain and gait problem.  All other systems reviewed and are negative.     Allergies  Review of patient's allergies indicates  no known allergies.  Home Medications   Prior to Admission medications   Medication Sig Start Date End Date Taking? Authorizing Provider  albuterol (PROVENTIL HFA;VENTOLIN HFA) 108 (90 BASE) MCG/ACT inhaler Inhale 2 puffs into the lungs every 4 (four) hours as needed for wheezing. 08/23/14   Graylon Good, PA-C  amoxicillin (AMOXIL) 500 MG capsule Take 2 capsules (1,000 mg total) by mouth 2 (two) times daily. 06/19/14   Hayden Rasmussen, NP  azithromycin (ZITHROMAX Z-PAK) 250 MG tablet Use as directed 08/23/14   Graylon Good, PA-C  chlorpheniramine-HYDROcodone (TUSSIONEX PENNKINETIC ER) 10-8 MG/5ML LQCR Take 5 mLs by mouth every 12 (twelve) hours as needed for cough. 08/23/14   Graylon Good, PA-C  lisinopril-hydrochlorothiazide (ZESTORETIC) 20-25 MG per tablet Take 1 tablet by mouth daily. 06/19/14   Hayden Rasmussen, NP  Multiple Vitamins-Minerals (GERITABS PO) Take 1 tablet by mouth daily as needed (for energy).    Historical Provider, MD  ondansetron (ZOFRAN) 4 MG tablet Take 1 tablet (4 mg total) by mouth every 6 (six) hours. 06/19/14   Hayden Rasmussen, NP  predniSONE (DELTASONE) 10 MG tablet 4 tabs PO QD for 4 days; 3 tabs PO QD for 3 days; 2 tabs PO QD for 2 days; 1 tab PO QD for 1 day 08/23/14   Earna Coder  H Baker, PA-C  traMADol (ULTRAM) 50 MG tablet Take 1 tablet (50 mg total) by mouth every 6 (six) hours as needed. For aches and pain 06/19/14   Hayden Rasmussen, NP   BP 173/83 mmHg  Pulse 75  Temp(Src) 98 F (36.7 C) (Oral)  Resp 16  SpO2 100%  LMP 09/03/2012 Physical Exam  Constitutional: She is oriented to person, place, and time. She appears well-developed and well-nourished. No distress.  HENT:  Head: Normocephalic and atraumatic.  Eyes: EOM are normal. Pupils are equal, round, and reactive to light.  Neck: Normal range of motion. Neck supple.  Cardiovascular: Normal rate, regular rhythm and normal heart sounds.  Exam reveals no gallop and no friction rub.   No murmur heard. Pulmonary/Chest: Effort  normal and breath sounds normal. She has no wheezes. She has no rales.  Abdominal: Soft. Bowel sounds are normal.  Musculoskeletal:  Mild tenderness to palpation of central lumbar spine and right and left lower back; no edema, erythema, spasms, or masses noted Mild tenderness to palpation of right lateral thigh; no edema or masses noted Mild tenderness to palpation of posterior knee; no edema or masses noted  Neurological: She is alert and oriented to person, place, and time. She has normal reflexes.  Skin: Skin is warm and dry. She is not diaphoretic.  Psychiatric: She has a normal mood and affect. Her behavior is normal.  Vitals reviewed.   ED Course  Procedures (including critical care time) Labs Review Labs Reviewed - No data to display  Imaging Review No results found.   EKG Interpretation None      MDM   Final diagnoses:  Lumbar strain, initial encounter    1:17 AM No bladder/bowel incontinence or saddle paresthesias. Patient likely has lumbar muscle spasm. Patient will be treated with mobic, robaxin, and tramadol.     Emilia Beck, PA-C 11/22/14 0202  Loren Racer, MD 11/22/14 [phone]

## 2014-12-09 ENCOUNTER — Ambulatory Visit (INDEPENDENT_AMBULATORY_CARE_PROVIDER_SITE_OTHER): Payer: 59 | Admitting: Internal Medicine

## 2014-12-09 ENCOUNTER — Encounter: Payer: Self-pay | Admitting: Internal Medicine

## 2014-12-09 VITALS — BP 154/90 | HR 102 | Ht 64.0 in | Wt 277.0 lb

## 2014-12-09 DIAGNOSIS — R011 Cardiac murmur, unspecified: Secondary | ICD-10-CM

## 2014-12-09 NOTE — Progress Notes (Signed)
Cardiology Office Note   Date:  12/09/2014   ID:  Natasha Doyle, DOB May 22, 1966, MRN [id number]  PCP:  Alysia Penna, MD  Cardiologist:   Dietrich Pates, MD   No chief complaint on file.     History of Present Illness: Natasha Doyle is a 49 y.o. female with a history of HTN greater than 10 years  Had during pregnancy   Pt works as Archivist CP  No SOB NO dizziness NO palpitations     Little brother 18 died MI  Hx tobacco use and EtOH ? Substance  Died suddenly Other brother died at 62 MI  Had had PTCAs in pastt      Current Outpatient Prescriptions  Medication Sig Dispense Refill  . lisinopril (PRINIVIL,ZESTRIL) 20 MG tablet Take 20 mg by mouth daily.     No current facility-administered medications for this visit.    Allergies:   Review of patient's allergies indicates no known allergies.   Past Medical History  Diagnosis Date  . Hypertension   . Obesity   . Diabetes mellitus without complication     Past Surgical History  Procedure Laterality Date  . Cholecystectomy    . Breath tek h pylori N/A 10/22/2014    Procedure: BREATH TEK H PYLORI;  Surgeon: Gaynelle Adu, MD;  Location: [facility];  Service: General;  Laterality: N/A;     Social History:  The patient  reports that she has never smoked. She has never used smokeless tobacco. She reports that she drinks alcohol. She reports that she does not use illicit drugs.   Family History:  The patient's family history includes Cancer in her mother; Heart disease in her brother; Hyperlipidemia in her brother; Hypertension in her brother.    ROS:  Please see the history of present illness. All other systems are reviewed and  Negative to the above problem except as noted.    PHYSICAL EXAM: VS:  BP 154/90 mmHg  Pulse 102  Ht  (1.626 m)  Wt 277 lb (125.646 kg)  BMI 47.52 kg/m2  LMP 09/03/2012  GEN: Well nourished, well developed, in no acute distress HEENT: normal Neck: no JVD, carotid  bruits, or masses Cardiac: RRR  GrI-II/VI systolic murmur LSB to apex  No rubs, or gallops,no edema  Respiratory:  clear to auscultation bilaterally, normal work of breathing GI: soft, nontender, nondistended, + BS  No hepatomegaly  MS: no deformity Moving all extremities   Skin: warm and dry, no rash Neuro:  Strength and sensation are intact Psych: euthymic mood, full affect   EKG:  EKG is ordered today. SR 64.  First degree AV block  PR 212.     Lipid Panel No results found for: CHOL, TRIG, HDL, CHOLHDL, VLDL, LDLCALC, LDLDIRECT    Wt Readings from Last 3 Encounters:  12/09/14 277 lb (125.646 kg)  02/07/14 256 lb (116.121 kg)  11/04/13 281 lb 14.4 oz (127.869 kg)      ASSESSMENT AND PLAN: 1  Preop  Pt with history of HTN  Being evaluated for gastric sleeve  Ovreall I think he is low risk for major cardiac complication and OK to proceed with surgery  2.  HTN  Would follow esp after surgery  I would not push Rx furhter now  3.  Murmur  Mild  WOuld set up with echo to confirm anatomy  4.  Lipids  WIll need to be followed after surgery  Current medicines are reviewed at length with the patient today.  The patient does not have concerns regarding medicines.  The following changes have been made:   Labs/ tests ordered today include: No orders of the defined types were placed in this encounter.     Disposition:   No definite f/u    Signed, Dietrich PatesPaula Rafik Koppel, MD  12/09/2014 2:10 PM    [facility] [address], [place], [place]  [address] Phone: [phone]; Fax: [phone]

## 2014-12-09 NOTE — Patient Instructions (Signed)
Medication Instructions:  Your physician recommends that you continue on your current medications as directed. Please refer to the Current Medication list given to you today.   Labwork: none  Testing/Procedures: Your physician has requested that you have an echocardiogram. Echocardiography is a painless test that uses sound waves to create images of your heart. It provides your doctor with information about the size and shape of your heart and how well your heart's chambers and valves are working. This procedure takes approximately one hour. There are no restrictions for this procedure.    Follow-Up:

## 2014-12-12 ENCOUNTER — Other Ambulatory Visit (HOSPITAL_COMMUNITY): Payer: 59

## 2014-12-17 ENCOUNTER — Ambulatory Visit (HOSPITAL_COMMUNITY): Payer: 59 | Attending: Cardiology

## 2014-12-17 ENCOUNTER — Other Ambulatory Visit: Payer: Self-pay

## 2014-12-17 DIAGNOSIS — E119 Type 2 diabetes mellitus without complications: Secondary | ICD-10-CM | POA: Insufficient documentation

## 2014-12-17 DIAGNOSIS — E669 Obesity, unspecified: Secondary | ICD-10-CM | POA: Insufficient documentation

## 2014-12-17 DIAGNOSIS — I10 Essential (primary) hypertension: Secondary | ICD-10-CM | POA: Insufficient documentation

## 2014-12-17 DIAGNOSIS — R011 Cardiac murmur, unspecified: Secondary | ICD-10-CM | POA: Diagnosis not present

## 2015-03-07 ENCOUNTER — Telehealth: Payer: Self-pay | Admitting: Internal Medicine

## 2015-03-07 NOTE — Telephone Encounter (Signed)
Follow Up    Pt is calling following up on cardiac clearance paperwork. States documentation should be faxed to [facility]- Dr. Smitty Cords (Fax number: [phone] attn: Seward Grater).

## 2015-03-14 NOTE — Telephone Encounter (Signed)
I have not seen clearance form. Left a message for patient to call back to inform.

## 2015-03-14 NOTE — Telephone Encounter (Signed)
The patient called back and states that the person she spoke with last week said they would fax the information.  There was no clearance form but in last OV note Dr. Tenny Craw states ok for bariatric surgery  ASSESSMENT AND PLAN: 1 Preop Pt with history of HTN Being evaluated for gastric sleeve Ovreall I think he is low risk for major cardiac complication and OK to proceed with surgery  She states she called the surgeon office and they have received the needed information.

## 2015-04-17 ENCOUNTER — Encounter
Admission: RE | Admit: 2015-04-17 | Discharge: 2015-04-17 | Disposition: A | Discharge status: Home / Self Care | Payer: 59 | Source: Ambulatory Visit | Attending: Specialist | Admitting: Specialist

## 2015-04-17 DIAGNOSIS — Z01812 Encounter for preprocedural laboratory examination: Secondary | ICD-10-CM | POA: Insufficient documentation

## 2015-04-17 LAB — BASIC METABOLIC PANEL
Anion gap: 8 (ref 5–15)
BUN: 15 mg/dL (ref 6–20)
CO2: 25 mmol/L (ref 22–32)
Calcium: 9.6 mg/dL (ref 8.9–10.3)
Chloride: 104 mmol/L (ref 101–111)
Creatinine, Ser: 0.77 mg/dL (ref 0.44–1.00)
GFR calc Af Amer: >60 mL/min (ref >60)
Glucose, Bld: 97 mg/dL (ref 65–99)
Potassium: 3.8 mmol/L (ref 3.5–5.1)
Sodium: 137 mmol/L (ref 135–145)

## 2015-04-17 LAB — CBC
HEMATOCRIT: 36.9 % (ref 35.0–47.0)
Hemoglobin: 12.1 g/dL (ref 12.0–16.0)
MCH: 26.9 pg (ref 26.0–34.0)
MCHC: 32.8 g/dL (ref 32.0–36.0)
MCV: 82.2 fL (ref 80.0–100.0)
Platelets: 303 10*3/uL (ref 150–440)
RBC: 4.49 MIL/uL (ref 3.80–5.20)
RDW: 15.7 % — AB (ref 11.5–14.5)
WBC: 4.7 10*3/uL (ref 3.6–11.0)

## 2015-04-17 LAB — TYPE AND SCREEN
ABO/RH(D): A POS
Antibody Screen: NEGATIVE

## 2015-04-17 LAB — ABO/RH: ABO/RH(D): A POS

## 2015-04-17 NOTE — Patient Instructions (Signed)
  Your procedure is scheduled on: Tuesday 04/29/2015 Report to Day Surgery. 2ND FLOOR MEDICAL MALL ENTRANCE To find out your arrival time please call [phone] between 1PM - 3PM on Monday 04/28/2015.  Remember: Instructions that are not followed completely may result in serious medical risk, up to and including death, or upon the discretion of your surgeon and anesthesiologist your surgery may need to be rescheduled.    __X__ 1. Do not eat food or drink liquids after midnight. No gum chewing or hard candies.     __X__ 2. No Alcohol for 24 hours before or after surgery.   ____ 3. Bring all medications with you on the day of surgery if instructed.    __X__ 4. Notify your doctor if there is any change in your medical condition     (cold, fever, infections).     Do not wear jewelry, make-up, hairpins, clips or nail polish.  Do not wear lotions, powders, or perfumes.   Do not shave 48 hours prior to surgery. Men may shave face and neck.  Do not bring valuables to the hospital.    [facility] is not responsible for any belongings or valuables.               Contacts, dentures or bridgework may not be worn into surgery.  Leave your suitcase in the car. After surgery it may be brought to your room.  For patients admitted to the hospital, discharge time is determined by your                treatment team.   Patients discharged the day of surgery will not be allowed to drive home.   Please read over the following fact sheets that you were given:   Surgical Site Infection Prevention   __X__ Take these medicines the morning of surgery with A SIP OF WATER:    1. LISINOPRIL  2.   3.   4.  5.  6.  ____ Fleet Enema (as directed)   __X__ Use CHG Soap as directed  ____ Use inhalers on the day of surgery  ____ Stop metformin 2 days prior to surgery    ____ Take 1/2 of usual insulin dose the night before surgery and none on the morning of surgery.   ____ Stop Coumadin/Plavix/aspirin  on   ____ Stop Anti-inflammatories on    ____ Stop supplements until after surgery.    ____ Bring C-Pap to the hospital.

## 2015-04-29 ENCOUNTER — Encounter: Payer: Self-pay | Admitting: *Deleted

## 2015-04-29 ENCOUNTER — Encounter
Admission: RE | Disposition: A | Discharge status: Home / Self Care | Payer: Self-pay | Source: Ambulatory Visit | Attending: Specialist

## 2015-04-29 ENCOUNTER — Inpatient Hospital Stay
Admission: RE | Admit: 2015-04-29 | Discharge: 2015-05-01 | DRG: 621 | Disposition: A | Discharge status: Home / Self Care | Payer: 59 | Source: Ambulatory Visit | Attending: Specialist | Admitting: Specialist

## 2015-04-29 ENCOUNTER — Inpatient Hospital Stay: Payer: 59 | Admitting: Anesthesiology

## 2015-04-29 DIAGNOSIS — K449 Diaphragmatic hernia without obstruction or gangrene: Secondary | ICD-10-CM | POA: Diagnosis present

## 2015-04-29 DIAGNOSIS — Z6841 Body Mass Index (BMI) 40.0 and over, adult: Secondary | ICD-10-CM | POA: Diagnosis not present

## 2015-04-29 DIAGNOSIS — I10 Essential (primary) hypertension: Secondary | ICD-10-CM | POA: Diagnosis present

## 2015-04-29 DIAGNOSIS — E119 Type 2 diabetes mellitus without complications: Secondary | ICD-10-CM | POA: Diagnosis present

## 2015-04-29 HISTORY — PX: LAPAROSCOPIC GASTRIC SLEEVE RESECTION: SHX5895

## 2015-04-29 LAB — GLUCOSE, CAPILLARY
GLUCOSE-CAPILLARY: 122 mg/dL — AB (ref 65–99)
Glucose-Capillary: 88 mg/dL (ref 65–99)

## 2015-04-29 LAB — HEMOGLOBIN AND HEMATOCRIT, BLOOD
HEMATOCRIT: 34.6 % — AB (ref 35.0–47.0)
Hemoglobin: 11.4 g/dL — ABNORMAL LOW (ref 12.0–16.0)

## 2015-04-29 LAB — CREATININE, SERUM
Creatinine, Ser: 0.85 mg/dL (ref 0.44–1.00)
GFR calc Af Amer: >60 mL/min (ref >60)
GFR calc non Af Amer: >60 mL/min (ref >60)

## 2015-04-29 LAB — CBC
HEMATOCRIT: 35.9 % (ref 35.0–47.0)
Hemoglobin: 11.7 g/dL — ABNORMAL LOW (ref 12.0–16.0)
MCH: 26.7 pg (ref 26.0–34.0)
MCHC: 32.5 g/dL (ref 32.0–36.0)
MCV: 82.4 fL (ref 80.0–100.0)
PLATELETS: 277 10*3/uL (ref 150–440)
RBC: 4.36 MIL/uL (ref 3.80–5.20)
RDW: 15.2 % — AB (ref 11.5–14.5)
WBC: 14 10*3/uL — ABNORMAL HIGH (ref 3.6–11.0)

## 2015-04-29 SURGERY — GASTRECTOMY, SLEEVE, LAPAROSCOPIC
Anesthesia: General | Wound class: Clean Contaminated

## 2015-04-29 MED ORDER — ONDANSETRON HCL 4 MG/2ML IJ SOLN
INTRAMUSCULAR | Status: AC
  Administered 2015-04-29: 20:00:00
  Filled 2015-04-29: qty 2

## 2015-04-29 MED ORDER — ACETAMINOPHEN 160 MG/5ML PO SOLN: 650.0000 mg | ORAL | Status: DC | PRN

## 2015-04-29 MED ORDER — SODIUM CHLORIDE 0.9 % IV SOLN
INTRAVENOUS | Status: DC
  Administered 2015-04-29: 15:00:00 via INTRAVENOUS

## 2015-04-29 MED ORDER — DEXTROSE 5 % IV SOLN
2.0000 g | Freq: Three times a day (TID) | INTRAVENOUS | Status: AC
  Administered 2015-04-29 – 2015-04-30 (×2): 2 g via INTRAVENOUS
  Filled 2015-04-29 (×2): qty 2

## 2015-04-29 MED ORDER — CEFOXITIN SODIUM-DEXTROSE 2-2.2 GM-% IV SOLR (PREMIX)
INTRAVENOUS | Status: AC
  Filled 2015-04-29: qty 50

## 2015-04-29 MED ORDER — ONDANSETRON HCL 4 MG/2ML IJ SOLN
4.0000 mg | INTRAMUSCULAR | Status: DC | PRN
  Administered 2015-04-30 – 2015-05-01 (×5): 4 mg via INTRAVENOUS
  Filled 2015-04-29 (×5): qty 2

## 2015-04-29 MED ORDER — PROPOFOL 10 MG/ML IV BOLUS
INTRAVENOUS | Status: DC | PRN
  Administered 2015-04-29: 180 mg via INTRAVENOUS

## 2015-04-29 MED ORDER — ACETAMINOPHEN 10 MG/ML IV SOLN
1000.0000 mg | Freq: Four times a day (QID) | INTRAVENOUS | Status: DC
  Filled 2015-04-29 (×2): qty 100

## 2015-04-29 MED ORDER — FENTANYL CITRATE (PF) 100 MCG/2ML IJ SOLN
INTRAMUSCULAR | Status: DC | PRN
  Administered 2015-04-29 (×2): 100 ug via INTRAVENOUS
  Administered 2015-04-29: 50 ug via INTRAVENOUS

## 2015-04-29 MED ORDER — FENTANYL CITRATE (PF) 100 MCG/2ML IJ SOLN
25.0000 ug | INTRAMUSCULAR | Status: DC | PRN
  Administered 2015-04-29 (×4): 25 ug via INTRAVENOUS

## 2015-04-29 MED ORDER — LIDOCAINE-EPINEPHRINE (PF) 1 %-1:200000 IJ SOLN
INTRAMUSCULAR | Status: DC | PRN
  Administered 2015-04-29: 9 mL

## 2015-04-29 MED ORDER — LIDOCAINE-EPINEPHRINE (PF) 1 %-1:200000 IJ SOLN
INTRAMUSCULAR | Status: AC
  Filled 2015-04-29: qty 30

## 2015-04-29 MED ORDER — FENTANYL CITRATE (PF) 100 MCG/2ML IJ SOLN
INTRAMUSCULAR | Status: AC
  Administered 2015-04-29: 25 ug via INTRAVENOUS
  Filled 2015-04-29: qty 2

## 2015-04-29 MED ORDER — SODIUM CHLORIDE 0.9 % IV SOLN
INTRAVENOUS | Status: DC
  Administered 2015-04-29 – 2015-05-01 (×6): via INTRAVENOUS

## 2015-04-29 MED ORDER — SODIUM CHLORIDE 0.9 % IJ SOLN
INTRAMUSCULAR | Status: AC
  Filled 2015-04-29: qty 6

## 2015-04-29 MED ORDER — ONDANSETRON HCL 4 MG/2ML IJ SOLN
INTRAMUSCULAR | Status: DC | PRN
  Administered 2015-04-29: 4 mg via INTRAVENOUS

## 2015-04-29 MED ORDER — ACETAMINOPHEN 10 MG/ML IV SOLN
INTRAVENOUS | Status: DC | PRN
  Administered 2015-04-29: 1000 mg via INTRAVENOUS

## 2015-04-29 MED ORDER — ACETAMINOPHEN 10 MG/ML IV SOLN
INTRAVENOUS | Status: AC
  Filled 2015-04-29: qty 100

## 2015-04-29 MED ORDER — SCOPOLAMINE 1 MG/3DAYS TD PT72
1.0000 | MEDICATED_PATCH | TRANSDERMAL | Status: DC
  Administered 2015-04-29: 1.5 mg via TRANSDERMAL

## 2015-04-29 MED ORDER — BUPIVACAINE-EPINEPHRINE (PF) 0.5% -1:200000 IJ SOLN
INTRAMUSCULAR | Status: DC | PRN
  Administered 2015-04-29: 10 mL

## 2015-04-29 MED ORDER — MIDAZOLAM HCL 2 MG/2ML IJ SOLN
INTRAMUSCULAR | Status: DC | PRN
  Administered 2015-04-29: 2 mg via INTRAVENOUS

## 2015-04-29 MED ORDER — CEFOXITIN SODIUM-DEXTROSE 2-2.2 GM-% IV SOLR (PREMIX)
2.0000 g | Freq: Once | INTRAVENOUS | Status: AC
  Administered 2015-04-29: 2000 mg via INTRAVENOUS

## 2015-04-29 MED ORDER — SUGAMMADEX SODIUM 500 MG/5ML IV SOLN
INTRAVENOUS | Status: DC | PRN
Start: 2015-04-29 — End: 2015-04-29
  Administered 2015-04-29: 250 mg via INTRAVENOUS

## 2015-04-29 MED ORDER — ENOXAPARIN SODIUM 30 MG/0.3ML ~~LOC~~ SOLN
30.0000 mg | Freq: Two times a day (BID) | SUBCUTANEOUS | Status: DC
  Administered 2015-04-30 – 2015-05-01 (×3): 30 mg via SUBCUTANEOUS
  Filled 2015-04-29 (×4): qty 0.3

## 2015-04-29 MED ORDER — ACETAMINOPHEN 10 MG/ML IV SOLN
1000.0000 mg | Freq: Four times a day (QID) | INTRAVENOUS | Status: AC
Start: 2015-04-29 — End: 2015-04-30
  Administered 2015-04-29 – 2015-04-30 (×3): 1000 mg via INTRAVENOUS
  Filled 2015-04-29 (×5): qty 100

## 2015-04-29 MED ORDER — ACETAMINOPHEN 160 MG/5ML PO SOLN: 325.0000 mg | ORAL | Status: DC | PRN

## 2015-04-29 MED ORDER — HYDROCODONE-ACETAMINOPHEN 7.5-325 MG/15ML PO SOLN: 15.0000 mL | Freq: Four times a day (QID) | ORAL | Status: AC | PRN

## 2015-04-29 MED ORDER — DEXAMETHASONE SODIUM PHOSPHATE 4 MG/ML IJ SOLN
INTRAMUSCULAR | Status: DC | PRN
  Administered 2015-04-29: 10 mg via INTRAVENOUS

## 2015-04-29 MED ORDER — MORPHINE SULFATE (PF) 2 MG/ML IV SOLN
2.0000 mg | INTRAVENOUS | Status: DC | PRN
  Administered 2015-04-30: 2 mg via INTRAVENOUS
  Filled 2015-04-29: qty 1

## 2015-04-29 MED ORDER — SODIUM CHLORIDE 0.9 % IJ SOLN
INTRAMUSCULAR | Status: AC
  Filled 2015-04-29: qty 10

## 2015-04-29 MED ORDER — PROMETHAZINE HCL 25 MG/ML IJ SOLN
INTRAMUSCULAR | Status: AC
  Administered 2015-04-29: 12.5 mg via INTRAVENOUS
  Filled 2015-04-29: qty 1

## 2015-04-29 MED ORDER — SCOPOLAMINE 1 MG/3DAYS TD PT72
MEDICATED_PATCH | TRANSDERMAL | Status: AC
  Filled 2015-04-29: qty 1

## 2015-04-29 MED ORDER — BUPIVACAINE-EPINEPHRINE (PF) 0.5% -1:200000 IJ SOLN
INTRAMUSCULAR | Status: AC
  Filled 2015-04-29: qty 30

## 2015-04-29 MED ORDER — ONDANSETRON 4 MG PO TBDP: 4.0000 mg | ORAL_TABLET | ORAL | Status: DC | PRN

## 2015-04-29 MED ORDER — ENOXAPARIN SODIUM 40 MG/0.4ML ~~LOC~~ SOLN: 40.0000 mg | SUBCUTANEOUS | Status: DC

## 2015-04-29 MED ORDER — ROCURONIUM BROMIDE 100 MG/10ML IV SOLN
INTRAVENOUS | Status: DC | PRN
  Administered 2015-04-29: 10 mg via INTRAVENOUS
  Administered 2015-04-29: 40 mg via INTRAVENOUS

## 2015-04-29 MED ORDER — KETOROLAC TROMETHAMINE 30 MG/ML IJ SOLN
INTRAMUSCULAR | Status: DC | PRN
  Administered 2015-04-29: 30 mg via INTRAVENOUS

## 2015-04-29 MED ORDER — OXYCODONE HCL 5 MG/5ML PO SOLN
5.0000 mg | ORAL | Status: DC | PRN
  Administered 2015-04-30 (×2): 10 mg via ORAL
  Filled 2015-04-29 (×2): qty 10

## 2015-04-29 MED ORDER — PROMETHAZINE HCL 25 MG/ML IJ SOLN
6.2500 mg | INTRAMUSCULAR | Status: DC | PRN
  Administered 2015-04-29: 12.5 mg via INTRAVENOUS

## 2015-04-29 SURGICAL SUPPLY — 48 items
APPLIER CLIP ROT 13.4 12 LRG (CLIP) ×2
APR CLP LRG 13.4X12 ROT 20 MLT (CLIP) ×1
BANDAGE ELASTIC 6 LF NS (GAUZE/BANDAGES/DRESSINGS) ×4 IMPLANT
BLADE SURG SZ11 CARB STEEL (BLADE) ×2 IMPLANT
BNDG CMPR MED 5X6 ELC HKLP NS (GAUZE/BANDAGES/DRESSINGS) ×2
CANISTER SUCT 1200ML W/VALVE (MISCELLANEOUS) ×2 IMPLANT
CHLORAPREP W/TINT 26ML (MISCELLANEOUS) ×4 IMPLANT
CLIP APPLIE ROT 13.4 12 LRG (CLIP) ×1 IMPLANT
DECANTER SPIKE VIAL GLASS SM (MISCELLANEOUS) ×4 IMPLANT
DEFOGGER SCOPE WARMER CLEARIFY (MISCELLANEOUS) ×2 IMPLANT
DRAPE UTILITY 15X26 TOWEL STRL (DRAPES) ×4 IMPLANT
FILTER LAP SMOKE EVAC STRL (MISCELLANEOUS) ×2 IMPLANT
GLOVE BIO SURGEON STRL SZ8 (GLOVE) ×4 IMPLANT
GOWN STRL REUS W/ TWL LRG LVL3 (GOWN DISPOSABLE) ×3 IMPLANT
GOWN STRL REUS W/ TWL XL LVL3 (GOWN DISPOSABLE) ×2 IMPLANT
GOWN STRL REUS W/TWL LRG LVL3 (GOWN DISPOSABLE) ×6
GOWN STRL REUS W/TWL XL LVL3 (GOWN DISPOSABLE) ×4
IRRIGATION STRYKERFLOW (MISCELLANEOUS) ×1 IMPLANT
IRRIGATOR STRYKERFLOW (MISCELLANEOUS)
IV NS 1000ML (IV SOLUTION) ×2
IV NS 1000ML BAXH (IV SOLUTION) ×1 IMPLANT
KIT RM TURNOVER STRD PROC AR (KITS) ×2 IMPLANT
LABEL OR SOLS (LABEL) ×2 IMPLANT
LIQUID BAND (GAUZE/BANDAGES/DRESSINGS) ×2 IMPLANT
NDL INSUFF 14G 150MM VS150000 (NEEDLE) ×2 IMPLANT
NDL SAFETY 22GX1.5 (NEEDLE) ×2 IMPLANT
NS IRRIG 500ML POUR BTL (IV SOLUTION) ×2 IMPLANT
PACK LAP CHOLECYSTECTOMY (MISCELLANEOUS) ×2 IMPLANT
RELOAD GREEN (STAPLE) ×2 IMPLANT
RELOAD STAPLE 60 3.8 GOLD REG (STAPLE) ×4 IMPLANT
RELOAD STAPLER GOLD 60MM (STAPLE) ×4 IMPLANT
SHEARS HARMONIC ACE PLUS 45CM (MISCELLANEOUS) ×2 IMPLANT
SLEEVE ENDOPATH XCEL 5M (ENDOMECHANICALS) ×4 IMPLANT
SLEEVE GASTRECTOMY 36FR VISIGI (MISCELLANEOUS) ×2 IMPLANT
STAPLER ECHELON BIOABSB 60 FLE (MISCELLANEOUS) ×8 IMPLANT
STAPLER ECHELON LONG 60 440 (INSTRUMENTS) ×2 IMPLANT
STAPLER RELOAD GOLD 60MM (STAPLE) ×8
SUT DEVICE BRAIDED 0X39 (SUTURE) ×2 IMPLANT
SUT VIC AB 3-0 SH 27 (SUTURE) ×2
SUT VIC AB 3-0 SH 27X BRD (SUTURE) ×1 IMPLANT
SUT VIC AB 4-0 PS2 18 (SUTURE) ×4 IMPLANT
TROCAR BLADELESS 15MM (ENDOMECHANICALS) ×2 IMPLANT
TROCAR SL VERSASTEP 5M LG  B (MISCELLANEOUS) ×1
TROCAR SL VERSASTEP 5M LG B (MISCELLANEOUS) ×1 IMPLANT
TROCAR XCEL 12X100 BLDLESS (ENDOMECHANICALS) ×2 IMPLANT
TROCAR XCEL NON-BLD 5MMX100MML (ENDOMECHANICALS) ×2 IMPLANT
TUBING INSUFFLATOR HEATED (MISCELLANEOUS) ×2 IMPLANT
WATER STERILE IRR 1000ML POUR (IV SOLUTION) ×2 IMPLANT

## 2015-04-29 NOTE — Progress Notes (Signed)
To OR with SCDS/BAIR PAWS/THERMAL CAP in place IV Tylenol and sacral dressing sent to OR with patient

## 2015-04-29 NOTE — Op Note (Signed)
Preoperative diagnosis: Morbid obesity; hiatal hernia Postoperative diagnosis: Same Procedure: Laparoscopic sleeve gastrectomy and hiatal hernia repair Surgeon: Smitty CordsBruce Assistant: None Specimens: Portion of stomach Findings: Moderate hiatal hernia Anesthesia: Gen. endotracheal Blood loss: None Drains: None  Clinical history: See history and physical  Details of procedure: The patient was taken to the operating room placed on the operating table in supine position. Monitors were placed. Oxygen was delivered. Antibiotics were given. Sequential sites were placed. The patient was placed under general anesthesia without incident. Timeout was performed. The abdomen was prepped and draped in usual sterile fashion.  The abdomen is accessed using a 5 mm optical trocar left upper outer quadrant. 30 hemostat without difficulty. Multiple other trochars were placed preparation for the procedure. A liver retractor was placed.  The hiatal hernia was identified and explored. This tear occurred over dissected free from the esophagus posterior vagus nerve and the closed using interrupted 0 Surgidac suture. This was done with the patient placed to prevent too tight at closure. The greater curvature was then addressed and dissected from the antrum already up to the fundus using a my scalpel. Posterior pancreatic Cancer taken down as well. The fundus was fully mobilized off of the left greater. A staple fire reinforced was fired from 6 Center's from the pylorus using a green load staple fire. Multiple gold loads were then fired through the 12 mm contralateral port parallel to the lesser curvature. This is done with reinforcement as well. Excess stomach was retrieved through the 15 mm port. The ports were then removed as well as liver retractor and the wounds closed using 4-0 Vicryl and Dermabond. Patient tolerated procedure well) recovery room in stable condition.

## 2015-04-29 NOTE — Anesthesia Postprocedure Evaluation (Signed)
  Anesthesia Post-op Note  Patient: Natasha Doyle  Procedure(s) Performed: Procedure(s): LAPAROSCOPIC GASTRIC SLEEVE RESECTION (N/A)  Anesthesia type:General  Patient location: PACU  Post pain: Pain level controlled  Post assessment: Post-op Vital signs reviewed, Patient's Cardiovascular Status Stable, Respiratory Function Stable, Patent Airway and No signs of Nausea or vomiting  Post vital signs: Reviewed and stable  Last Vitals:  Filed Vitals:   04/29/15 2033  BP:   Pulse: 82  Temp:   Resp:     Level of consciousness: awake, alert  and patient cooperative  Complications: No apparent anesthesia complications

## 2015-04-29 NOTE — Anesthesia Preprocedure Evaluation (Signed)
Anesthesia Evaluation  Patient identified by MRN, date of birth, ID band Patient awake    Reviewed: Allergy & Precautions, H&P , NPO status , Patient's Chart, lab work & pertinent test results, reviewed documented beta blocker date and time   History of Anesthesia Complications (+) PONV and history of anesthetic complications  Airway Mallampati: IV  TM Distance: >3 FB Neck ROM: full    Dental no notable dental hx. (+) Partial Lower, Caps   Pulmonary neg pulmonary ROS,    Pulmonary exam normal breath sounds clear to auscultation       Cardiovascular Exercise Tolerance: Good hypertension, On Medications (-) angina(-) CAD, (-) Past MI, (-) Cardiac Stents and (-) CABG Normal cardiovascular exam(-) dysrhythmias (-) Valvular Problems/Murmurs Rhythm:regular Rate:Normal     Neuro/Psych negative neurological ROS  negative psych ROS   GI/Hepatic Neg liver ROS, hiatal hernia, neg GERD  ,  Endo/Other  diabetes (borderline)Morbid obesity  Renal/GU negative Renal ROS  negative genitourinary   Musculoskeletal   Abdominal   Peds  Hematology negative hematology ROS (+)   Anesthesia Other Findings Past Medical History:   Hypertension                                                 Obesity                                                      Diabetes mellitus without complication (HCC)                 Reproductive/Obstetrics negative OB ROS                             Anesthesia Physical Anesthesia Plan  ASA: III  Anesthesia Plan: General   Post-op Pain Management:    Induction:   Airway Management Planned:   Additional Equipment:   Intra-op Plan:   Post-operative Plan:   Informed Consent: I have reviewed the patients History and Physical, chart, labs and discussed the procedure including the risks, benefits and alternatives for the proposed anesthesia with the patient or authorized  representative who has indicated his/her understanding and acceptance.   Dental Advisory Given  Plan Discussed with: Anesthesiologist, CRNA and Surgeon  Anesthesia Plan Comments:         Anesthesia Quick Evaluation

## 2015-04-29 NOTE — H&P (Signed)
See faxed h and p Lungs and heart normal

## 2015-04-29 NOTE — Anesthesia Procedure Notes (Signed)
Procedure Name: Intubation Date/Time: 04/29/2015 4:28 PM Performed by: Omer JackWEATHERLY, Tenley Winward Pre-anesthesia Checklist: Patient identified, Patient being monitored, Timeout performed, Emergency Drugs available and Suction available Patient Re-evaluated:Patient Re-evaluated prior to inductionOxygen Delivery Method: Circle system utilized Preoxygenation: Pre-oxygenation with 100% oxygen Intubation Type: IV induction Ventilation: Mask ventilation without difficulty Laryngoscope Size: Mac and 3 Grade View: Grade I Tube type: Oral Tube size: 7.0 mm Number of attempts: 1 Placement Confirmation: ETT inserted through vocal cords under direct vision,  positive ETCO2 and breath sounds checked- equal and bilateral Secured at: 21 cm Tube secured with: Tape Dental Injury: Teeth and Oropharynx as per pre-operative assessment

## 2015-04-29 NOTE — Transfer of Care (Signed)
Immediate Anesthesia Transfer of Care Note  Patient: Natasha Mission Lionsamela D Doyle  Procedure(s) Performed: Procedure(s): LAPAROSCOPIC GASTRIC SLEEVE RESECTION (N/A)  Patient Location: PACU  Anesthesia Type:General  Level of Consciousness: awake, alert  and oriented  Airway & Oxygen Therapy: Patient Spontanous Breathing and Patient connected to face mask oxygen  Post-op Assessment: Report given to RN and Post -op Vital signs reviewed and stable  Post vital signs: stable  Last Vitals:  Filed Vitals:   04/29/15 1746  BP: 159/66  Pulse: 77  Temp: 36.4 C  Resp: 16    Complications: No apparent anesthesia complications

## 2015-04-30 ENCOUNTER — Encounter: Payer: Self-pay | Admitting: Specialist

## 2015-04-30 LAB — CBC WITH DIFFERENTIAL/PLATELET
BASOS ABS: 0 10*3/uL (ref 0–0.1)
BASOS PCT: 0 %
Eosinophils Absolute: 0 10*3/uL (ref 0–0.7)
Eosinophils Relative: 0 %
HEMATOCRIT: 35.8 % (ref 35.0–47.0)
Hemoglobin: 11.8 g/dL — ABNORMAL LOW (ref 12.0–16.0)
LYMPHS PCT: 8 %
Lymphs Abs: 1 10*3/uL (ref 1.0–3.6)
MCH: 27.2 pg (ref 26.0–34.0)
MCHC: 32.9 g/dL (ref 32.0–36.0)
MCV: 82.6 fL (ref 80.0–100.0)
MONO ABS: 0.3 10*3/uL (ref 0.2–0.9)
Monocytes Relative: 3 %
NEUTROS ABS: 11.1 10*3/uL — AB (ref 1.4–6.5)
Neutrophils Relative %: 89 %
Platelets: 279 10*3/uL (ref 150–440)
RBC: 4.33 MIL/uL (ref 3.80–5.20)
RDW: 15.4 % — AB (ref 11.5–14.5)
WBC: 12.5 10*3/uL — AB (ref 3.6–11.0)

## 2015-04-30 LAB — COMPREHENSIVE METABOLIC PANEL
ALT: 57 U/L — ABNORMAL HIGH (ref 14–54)
ANION GAP: 7 (ref 5–15)
AST: 46 U/L — ABNORMAL HIGH (ref 15–41)
Albumin: 3.8 g/dL (ref 3.5–5.0)
Alkaline Phosphatase: 61 U/L (ref 38–126)
BILIRUBIN TOTAL: 0.7 mg/dL (ref 0.3–1.2)
BUN: 12 mg/dL (ref 6–20)
CO2: 24 mmol/L (ref 22–32)
Calcium: 8.9 mg/dL (ref 8.9–10.3)
Chloride: 106 mmol/L (ref 101–111)
Creatinine, Ser: 0.81 mg/dL (ref 0.44–1.00)
Glucose, Bld: 148 mg/dL — ABNORMAL HIGH (ref 65–99)
POTASSIUM: 3.9 mmol/L (ref 3.5–5.1)
Sodium: 137 mmol/L (ref 135–145)
TOTAL PROTEIN: 8 g/dL (ref 6.5–8.1)

## 2015-04-30 LAB — HEMOGLOBIN AND HEMATOCRIT, BLOOD
HCT: 35.6 % (ref 35.0–47.0)
HEMOGLOBIN: 11.6 g/dL — AB (ref 12.0–16.0)

## 2015-04-30 MED ORDER — LISINOPRIL 20 MG PO TABS
20.0000 mg | ORAL_TABLET | Freq: Every day | ORAL | Status: DC
  Administered 2015-04-30 – 2015-05-01 (×2): 20 mg via ORAL
  Filled 2015-04-30 (×2): qty 1

## 2015-04-30 MED ORDER — UNJURY CHICKEN SOUP POWDER
2.0000 [oz_av] | Freq: Three times a day (TID) | ORAL | Status: DC
  Administered 2015-04-30 – 2015-05-01 (×4): 2 [oz_av] via ORAL

## 2015-04-30 MED ORDER — METOCLOPRAMIDE HCL 5 MG/ML IJ SOLN
5.0000 mg | Freq: Four times a day (QID) | INTRAMUSCULAR | Status: AC
  Administered 2015-04-30 – 2015-05-01 (×4): 5 mg via INTRAVENOUS
  Filled 2015-04-30 (×4): qty 2

## 2015-04-30 MED ORDER — PROMETHAZINE HCL 25 MG/ML IJ SOLN: 12.5000 mg | INTRAMUSCULAR | Status: DC | PRN

## 2015-04-30 NOTE — Final Progress Note (Signed)
Moving slowly secondary to nausea. Poor po intake. Reports ambulating several times and voiding.  Lungs clear but decreased volume. abd flat/soft.  Will add phenergan/reglan and monitor.

## 2015-04-30 NOTE — Clinical Social Work Note (Signed)
CSW consulted but no reason given. Nursing admission for patient said "no" beside CSW consult needed. When CSW inquired why the consult was placed of admission nurse and he did not know why. Consult entered in error. York SpanielMonica Renetta Suman MSW,LCSW [phone]

## 2015-04-30 NOTE — Progress Notes (Signed)
INTERVENTION:  RD consulted for nutrition education regarding inpatient bariatric surgery.   RD provided "The Liquid Diet" handout from the Bariatric Surgery Guide from the [facility]. This handout previously provided to patient prior to surgery is a duplicate copy. Discussed what foods/liquids are consistent with a Clear Liquid Diet and reinforced Key Concepts such as no carbonation, no caffeine, or sugar containing beverages. Provided methods to prevent dehydration and promote protein intake, using clock and sample fluid schedule. RD encouraged follow-up with outpatient dietitian after discharge.  Teach back method used.  Expect good compliance.  NUTRITION DIAGNOSIS:  Food and nutrition knowledge related deficit related to recent bariatric surgery as evidenced by dietitian consult for nutrition education   GOAL:  Patient will be able to sip and tolerate CL within 24-48 hours  MONITOR:  Energy intake Digestive system  ASSESSMENT:  Pt s/p lap gastric sleeve, hiatal hernia repair.    Body mass index is 45.89 kg/(m^2).   Current diet order is baratric clear liquids with unjury supplement TID, patient is consuming approximately 0% at this time secondary to nausea, vomiting.  MD adding phenergan/reglan  Labs and medications reviewed.   LOW Care Level  Natasha Doyle B. Freida BusmanAllen, RD, LDN [phone] (pager)

## 2015-05-01 LAB — SURGICAL PATHOLOGY

## 2015-05-01 NOTE — Discharge Summary (Signed)
Physician Discharge Summary  Patient ID: Natasha Doyle MRN: [id number] DOB/AGE: 50/06/1965 50 y.o.  Admit date: 04/29/2015 Discharge date: 05/01/2015  Admission Diagnoses: Morbid Obesity  Discharge Diagnoses:  Active Problems:   Morbid obesity (HCC)   Discharged Condition: good  Hospital Course: Pt underwent a laparoscopic sleeve gastrectomy and had an unremarkable post operative course. When she was capable of ingesting adequate oral intake, tolerating oral medications and ambulating/voiding without difficulty. She was discharged home.   Consults: None  Significant Diagnostic Studies: labs: see chart  Treatments: IV hydration, antibiotics: mefoxin, analgesia: acetaminophen, Dilaudid and Hycet and procedures: lap sleeve gastrectomy  Discharge Exam: Blood pressure 173/73, pulse 76, temperature 98.4 F (36.9 C), temperature source Oral, resp. rate 17, height 5\' 3"  (1.6 m), weight 117.482 kg (259 lb), last menstrual period 09/03/2012, SpO2 100 %. General appearance: alert, cooperative and no distress Resp: clear to auscultation bilaterally GI: soft, non-tender; bowel sounds normal; no masses,  no organomegaly Extremities: extremities normal, atraumatic, no cyanosis or edema  Disposition: 01-Home or Self Care  Discharge Instructions    Call MD for:  difficulty breathing, headache or visual disturbances    Complete by:  As directed      Call MD for:  extreme fatigue    Complete by:  As directed      Call MD for:  hives    Complete by:  As directed      Call MD for:  persistant dizziness or light-headedness    Complete by:  As directed      Call MD for:  persistant nausea and vomiting    Complete by:  As directed      Call MD for:  redness, tenderness, or signs of infection (pain, swelling, redness, odor or green/yellow discharge around incision site)    Complete by:  As directed      Call MD for:  severe uncontrolled pain    Complete by:  As directed      Call MD for:   temperature >100.4    Complete by:  As directed      Discharge instructions    Complete by:  As directed   Remember to start your chewable or liquid B complex once you arrive home and take this daily for 30 days. You may continue to take this beyond 30 days if you choose. Stick with a Clear liquid diet for 2 days post operatively then you may advance to a Full Liquid diet for 12 days, which means you will be on a strict liquid diet for 14 days. Your daily goal is going to be to get in 60-80g of protein and 64oz of fluid.     Driving Restrictions    Complete by:  As directed   You may not drive until 24 hours past your last dose of pain medications.     Increase activity slowly    Complete by:  As directed      Lifting restrictions    Complete by:  As directed   Do not lift more than 10-15 pounds for 4-6 weeks post operatively     No dressing needed    Complete by:  As directed      No wound care    Complete by:  As directed      Other Restrictions    Complete by:  As directed   Avoid using your core muscles for 30 days after surgery - motions like pushing, pulling, climbing etc. Make sure to get  up and walk frequently every day to help avoid developing blood clots.            Medication List    STOP taking these medications        chlorthalidone 25 MG tablet  Commonly known as:  HYGROTON     multivitamin tablet      TAKE these medications        enoxaparin 40 MG/0.4ML injection  Commonly known as:  LOVENOX  Inject 0.4 mLs (40 mg total) into the skin daily.     HYDROcodone-acetaminophen 7.5-325 mg/15 ml solution  Commonly known as:  HYCET  Take 15 mLs by mouth 4 (four) times daily as needed for moderate pain.     lisinopril 20 MG tablet  Commonly known as:  PRINIVIL,ZESTRIL  Take 20 mg by mouth daily.     ondansetron 4 MG disintegrating tablet  Commonly known as:  ZOFRAN ODT  Take 1 tablet (4 mg total) by mouth every 4 (four) hours as needed for nausea or vomiting.          Signed: Carrolyn Leigh 05/01/2015, 4:02 PM

## 2015-05-01 NOTE — Progress Notes (Signed)
Pt d/c home; d/c instructions reviewed w/ pt; pt understanding was verbalized; IV removed catheter in tact, gauze dressing applied; all pt questions answered; pt left unit via wheelchair accompanied by staff 

## 2015-05-01 NOTE — Discharge Instructions (Signed)
Follow all MD discharge instructions. Take all medications as prescribed. Keep all follow up appointments. If your symptoms return, call your doctor. If you experience any new symptoms that are of concern to you or that are bothersome to you, call your doctor. For all questions and/or concerns, call your doctor. ° ° If you have a medical emergency, call 911 ° °If you experience any bleeding or discharge from your incision sites, redness or swelling at your incision sites, fever, pain uncontrolled by medication, increase in pain, or any nausea, call your doctor.  °

## 2015-05-27 ENCOUNTER — Encounter: Payer: Self-pay | Admitting: Medical Oncology

## 2015-05-27 ENCOUNTER — Emergency Department
Admission: EM | Admit: 2015-05-27 | Discharge: 2015-05-27 | Disposition: A | Discharge status: Home / Self Care | Payer: 59 | Attending: Emergency Medicine | Admitting: Emergency Medicine

## 2015-05-27 ENCOUNTER — Emergency Department: Payer: 59

## 2015-05-27 DIAGNOSIS — I10 Essential (primary) hypertension: Secondary | ICD-10-CM | POA: Insufficient documentation

## 2015-05-27 DIAGNOSIS — K5900 Constipation, unspecified: Secondary | ICD-10-CM | POA: Diagnosis present

## 2015-05-27 DIAGNOSIS — E119 Type 2 diabetes mellitus without complications: Secondary | ICD-10-CM | POA: Insufficient documentation

## 2015-05-27 DIAGNOSIS — Z79899 Other long term (current) drug therapy: Secondary | ICD-10-CM | POA: Diagnosis not present

## 2015-05-27 DIAGNOSIS — R1032 Left lower quadrant pain: Secondary | ICD-10-CM

## 2015-05-27 MED ORDER — POLYETHYLENE GLYCOL 3350 17 GM/SCOOP PO POWD: 17.0000 g | Freq: Every day | ORAL | Status: DC

## 2015-05-27 NOTE — Discharge Instructions (Signed)
Constipation, Adult Constipation is when a person:  Poops (has a bowel movement) less than 3 times a week.  Has a hard time pooping.  Has poop that is dry, hard, or bigger than normal. HOME CARE   Eat foods with a lot of fiber in them. This includes fruits, vegetables, beans, and whole grains such as brown rice.  Avoid fatty foods and foods with a lot of sugar. This includes french fries, hamburgers, cookies, candy, and soda.  If you are not getting enough fiber from food, take products with added fiber in them (supplements).  Drink enough fluid to keep your pee (urine) clear or pale yellow.  Exercise on a regular basis, or as told by your doctor.  Go to the restroom when you feel like you need to poop. Do not hold it.  Only take medicine as told by your doctor. Do not take medicines that help you poop (laxatives) without talking to your doctor first. GET HELP RIGHT AWAY IF:   You have bright red blood in your poop (stool).  Your constipation lasts more than 4 days or gets worse.  You have belly (abdominal) or butt (rectal) pain.  You have thin poop (as thin as a pencil).  You lose weight, and it cannot be explained. MAKE SURE YOU:   Understand these instructions.  Will watch your condition.  Will get help right away if you are not doing well or get worse.   This information is not intended to replace advice given to you by your health care provider. Make sure you discuss any questions you have with your health care provider.   Document Released: 11/17/2007 Document Revised: 06/21/2014 Document Reviewed: 03/12/2013 Elsevier Interactive Patient Education 2016 Elsevier Inc.  

## 2015-05-27 NOTE — ED Provider Notes (Signed)
[facility] Emergency Department Provider Note  Time seen: 11:34 AM  I have reviewed the triage vital signs and the nursing notes.   HISTORY  Chief Complaint Constipation    HPI Natasha Minorsamela D Doyle is a 49 y.o. female with a past medical history of hypertension, diabetes, status post gastric sleeve 04/25/15 who presents the emergency department with constipation. Cor and the patient she has not had a bowel movement in 11 days at this point. Patient states she can feel hard stool in her rectum, but cannot pass it and it has become quite painful for her. Tried an enema last night without relief. Denies any abdominal pain, does state moderate rectal pain when attempting to have a bowel movement. Denies any black or bloody stool. Denies any nausea or vomiting, fever, or dysuria.     Past Medical History  Diagnosis Date  . Hypertension   . Obesity   . Diabetes mellitus without complication [facility](HCC)     Patient Active Problem List   Diagnosis Date Noted  . Morbid obesity (HCC) 04/29/2015  . High blood pressure 09/28/2013  . Obesities, morbid (HCC) 09/28/2013    Past Surgical History  Procedure Laterality Date  . Cholecystectomy    . Breath tek h pylori N/A 10/22/2014    Procedure: BREATH TEK H PYLORI;  Surgeon: Gaynelle AduEric Wilson, MD;  [facility];  Service: General;  Laterality: N/A;  . Laparoscopic gastric sleeve resection N/A 04/29/2015    Procedure: LAPAROSCOPIC GASTRIC SLEEVE RESECTION;  Surgeon: Geoffry ParadiseJon M Bruce, MD;  Location: [facility] ORS;  Service: General;  Laterality: N/A;    Current Outpatient Rx  Name  Route  Sig  Dispense  Refill  . enoxaparin (LOVENOX) 40 MG/0.4ML injection   Subcutaneous   Inject 0.4 mLs (40 mg total) into the skin daily.   14 Syringe   0   . HYDROcodone-acetaminophen (HYCET) 7.5-325 mg/15 ml solution   Oral   Take 15 mLs by mouth 4 (four) times daily as needed for moderate pain.   473 mL   0   . lisinopril  (PRINIVIL,ZESTRIL) 20 MG tablet   Oral   Take 20 mg by mouth daily.         . ondansetron (ZOFRAN ODT) 4 MG disintegrating tablet   Oral   Take 1 tablet (4 mg total) by mouth every 4 (four) hours as needed for nausea or vomiting.   20 tablet   0     Allergies Review of patient's allergies indicates no known allergies.  Family History  Problem Relation Age of Onset  . Cancer Mother     breast  . Heart disease Brother   . Hyperlipidemia Brother   . Hypertension Brother     Social History Social History  Substance Use Topics  . Smoking status: Never Smoker   . Smokeless tobacco: Never Used  . Alcohol Use: Yes     Comment: occ    Review of Systems Constitutional: Negative for fever. Cardiovascular: Negative for chest pain. Respiratory: Negative for shortness of breath. Gastrointestinal: Negative for abdominal pain, vomiting and diarrhea. Positive for rectal pain when attempting to have a bowel movement. Genitourinary: Negative for dysuria. Neurological: Negative for headache 10-point ROS otherwise negative.  ____________________________________________   PHYSICAL EXAM:  VITAL SIGNS: ED Triage Vitals  Enc Vitals Group     BP 05/27/15 1019 142/81 mmHg     Pulse Rate 05/27/15 1019 85     Resp 05/27/15 1019 18  Temp 05/27/15 1019 98.2 F (36.8 C)     Temp Source 05/27/15 1019 Oral     SpO2 05/27/15 1019 100 %     Weight 05/27/15 1019 244 lb (110.678 kg)     Height 05/27/15 1019  (1.626 m)     Head Cir --      Peak Flow --      Pain Score 05/27/15 1020 6     Pain Loc --      Pain Edu? --      Excl. in GC? --     Constitutional: Alert and oriented. Well appearing and in no distress. Eyes: Normal exam ENT   Head: Normocephalic and atraumatic.   Mouth/Throat: Mucous membranes are moist. Cardiovascular: Normal rate, regular rhythm. No murmur Respiratory: Normal respiratory effort without tachypnea nor retractions. Breath sounds are clear   Gastrointestinal: Soft and nontender. No distention.   Musculoskeletal: Nontender with normal range of motion in all extremities. Neurologic:  Normal speech and language. No gross focal neurologic deficits  Skin:  Skin is warm, dry and intact.  Psychiatric: Mood and affect are normal. Speech and behavior are normal.  ____________________________________________   RADIOLOGY  Abdominal x-ray is negative   INITIAL IMPRESSION / ASSESSMENT AND PLAN / ED COURSE  Pertinent labs & imaging results that were available during my care of the patient were reviewed by me and considered in my medical decision making (see chart for details).  Patient with symptoms of constipation 11 days. Her description is most consistent with fecal impaction. I will proceed with rectal exam, and disimpaction if necessary, then we will proceed with a soapsuds enema. The patient has no abdominal tenderness to palpation, and a negative x-ray. Patient just started MiraLAX yesterday.  Rectal exam shows no obvious fecal impaction, minimal stool in the rectal vault, what is there is soft. Dark in color but guaiac negative.  Patient has had 2 soapsuds enemas, after the second enema patient presented very large bowel movement and feels much better. Patient to continue MiraLAX at home per her surgeon, and will follow-up with her surgeon.  ____________________________________________   FINAL CLINICAL IMPRESSION(S) / ED DIAGNOSES   Constipation Abdominal pain   Minna Antis, MD 05/27/15 1505

## 2015-05-27 NOTE — ED Notes (Signed)
MD at bedside. 

## 2015-05-27 NOTE — ED Notes (Signed)
Pt reports that she had bariatric surgery on 11/15, pt reports she is on soft food diet but for about 11 days she has not had BM and has been feeling nauseated.

## 2015-05-27 NOTE — ED Notes (Signed)
Pt states she was able to pass the hardened stool. Feels much better.

## 2015-05-27 NOTE — ED Notes (Signed)
With MD at bedside for rectal exam.  

## 2015-05-27 NOTE — ED Notes (Signed)
Pt reports that she has not had BM X 11 days. States that she had gastric bypass on 04/25/15 and was doing fine with BM until she started on her soft diet. Pt reports that she can feel the feces in rectum but is unable to pass it because it is hard and she is afraid to strain due to incision. Pt did try an enema yesterday but was unable to pass stool. Denies pain or tenderness. Positive for nausea. Pt alert and oriented X4, active, cooperative, pt in NAD. RR even and unlabored, color WNL.

## 2015-05-27 NOTE — ED Notes (Signed)
Pt had moderate amount of BM after, thin consistency. Pt states that she feels she still has a large hard piece in rectum.

## 2015-07-29 ENCOUNTER — Emergency Department (HOSPITAL_COMMUNITY)
Admission: EM | Admit: 2015-07-29 | Discharge: 2015-07-29 | Disposition: A | Discharge status: Home / Self Care | Payer: Self-pay | Attending: Emergency Medicine | Admitting: Emergency Medicine

## 2015-07-29 ENCOUNTER — Encounter (HOSPITAL_COMMUNITY): Payer: Self-pay | Admitting: Neurology

## 2015-07-29 DIAGNOSIS — E119 Type 2 diabetes mellitus without complications: Secondary | ICD-10-CM | POA: Insufficient documentation

## 2015-07-29 DIAGNOSIS — B349 Viral infection, unspecified: Secondary | ICD-10-CM | POA: Insufficient documentation

## 2015-07-29 DIAGNOSIS — J029 Acute pharyngitis, unspecified: Secondary | ICD-10-CM | POA: Insufficient documentation

## 2015-07-29 DIAGNOSIS — E669 Obesity, unspecified: Secondary | ICD-10-CM | POA: Insufficient documentation

## 2015-07-29 DIAGNOSIS — Z79899 Other long term (current) drug therapy: Secondary | ICD-10-CM | POA: Insufficient documentation

## 2015-07-29 DIAGNOSIS — I10 Essential (primary) hypertension: Secondary | ICD-10-CM | POA: Insufficient documentation

## 2015-07-29 LAB — CBG MONITORING, ED: Glucose-Capillary: 92 mg/dL (ref 65–99)

## 2015-07-29 MED ORDER — HYDROCODONE-HOMATROPINE 5-1.5 MG/5ML PO SYRP: 5.0000 mL | ORAL_SOLUTION | Freq: Four times a day (QID) | ORAL | Status: DC | PRN

## 2015-07-29 NOTE — Discharge Instructions (Signed)
Viral Infections °A viral infection can be caused by different types of viruses. Most viral infections are not serious and resolve on their own. However, some infections may cause severe symptoms and may lead to further complications. °SYMPTOMS °Viruses can frequently cause: °· Minor sore throat. °· Aches and pains. °· Headaches. °· Runny nose. °· Different types of rashes. °· Watery eyes. °· Tiredness. °· Cough. °· Loss of appetite. °· Gastrointestinal infections, resulting in nausea, vomiting, and diarrhea. °These symptoms do not respond to antibiotics because the infection is not caused by bacteria. However, you might catch a bacterial infection following the viral infection. This is sometimes called a "superinfection." Symptoms of such a bacterial infection may include: °· Worsening sore throat with pus and difficulty swallowing. °· Swollen neck glands. °· Chills and a high or persistent fever. °· Severe headache. °· Tenderness over the sinuses. °· Persistent overall ill feeling (malaise), muscle aches, and tiredness (fatigue). °· Persistent cough. °· Yellow, green, or brown mucus production with coughing. °HOME CARE INSTRUCTIONS  °· Only take over-the-counter or prescription medicines for pain, discomfort, diarrhea, or fever as directed by your caregiver. °· Drink enough water and fluids to keep your urine clear or pale yellow. Sports drinks can provide valuable electrolytes, sugars, and hydration. °· Get plenty of rest and maintain proper nutrition. Soups and broths with crackers or rice are fine. °SEEK IMMEDIATE MEDICAL CARE IF:  °· You have severe headaches, shortness of breath, chest pain, neck pain, or an unusual rash. °· You have uncontrolled vomiting, diarrhea, or you are unable to keep down fluids. °· You or your child has an oral temperature above 102° F (38.9° C), not controlled by medicine. °· Your baby is older than 3 months with a rectal temperature of 102° F (38.9° C) or higher. °· Your baby is 3  months old or younger with a rectal temperature of 100.4° F (38° C) or higher. °MAKE SURE YOU:  °· Understand these instructions. °· Will watch your condition. °· Will get help right away if you are not doing well or get worse. °  °This information is not intended to replace advice given to you by your health care provider. Make sure you discuss any questions you have with your health care provider. °  °Document Released: 03/10/2005 Document Revised: 08/23/2011 Document Reviewed: 11/06/2014 °Elsevier Interactive Patient Education ©2016 Elsevier Inc. ° °

## 2015-07-29 NOTE — ED Notes (Signed)
CBG 92 mg/dL reported to [facility]

## 2015-07-29 NOTE — ED Notes (Signed)
Denies urinary symptoms

## 2015-07-29 NOTE — ED Provider Notes (Signed)
CSN: [id number]     Arrival date & time 07/29/15  0830 History   First MD Initiated Contact with Patient 07/29/15 [phone]     Chief Complaint  Patient presents with  . Generalized Body Aches  . Fever     (Consider location/radiation/quality/duration/timing/severity/associated sxs/prior Treatment) HPI Comments: Patient presents to the emergency department with chief complaint of generalized body aches and subjective fevers and chills. She states that she has been feeling sick since Saturday night. She reports mild associated nonproductive cough. She denies any sore throat. She states that she has been around sick contacts. She did not get a flu shot. She has tried taking Tylenol with good relief. She denies any associated nausea, vomiting, or dysuria. There are no modifying factors.  The history is provided by the patient. No language interpreter was used.    Past Medical History  Diagnosis Date  . Hypertension   . Obesity   . Diabetes mellitus without complication [facility])    Past Surgical History  Procedure Laterality Date  . Cholecystectomy    . Breath tek h pylori N/A 10/22/2014    Procedure: BREATH TEK H PYLORI;  Surgeon: Gaynelle Adu, MD;  Location[facility];  Service: General;  Laterality: N/A;  . Laparoscopic gastric sleeve resection N/A 04/29/2015    Procedure: LAPAROSCOPIC GASTRIC SLEEVE RESECTION;  Surgeon: Geoffry Paradise, MD;  Location: [facility] ORS;  Service: General;  Laterality: N/A;   Family History  Problem Relation Age of Onset  . Cancer Mother     breast  . Heart disease Brother   . Hyperlipidemia Brother   . Hypertension Brother    Social History  Substance Use Topics  . Smoking status: Never Smoker   . Smokeless tobacco: Never Used  . Alcohol Use: Yes     Comment: occ   OB History    Gravida Para Term Preterm AB TAB SAB Ectopic Multiple Living   0 0 0 0 0 0 2     Review of Systems  Constitutional: Positive for fever and chills.  HENT: Positive for  postnasal drip, rhinorrhea, sinus pressure, sneezing and sore throat.   Respiratory: Positive for cough. Negative for shortness of breath.   Cardiovascular: Negative for chest pain.  Gastrointestinal: Negative for nausea, vomiting, abdominal pain, diarrhea and constipation.  Genitourinary: Negative for dysuria.  All other systems reviewed and are negative.     Allergies  Review of patient's allergies indicates no known allergies.  Home Medications   Prior to Admission medications   Medication Sig Start Date End Date Taking? Authorizing Provider  enoxaparin (LOVENOX) 40 MG/0.4ML injection Inject 0.4 mLs (40 mg total) into the skin daily. 04/29/15   Carrolyn Leigh, PA-C  HYDROcodone-acetaminophen (HYCET) 7.5-325 mg/15 ml solution Take 15 mLs by mouth 4 (four) times daily as needed for moderate pain. 04/29/15 04/28/16  Carrolyn Leigh, PA-C  HYDROcodone-homatropine (HYCODAN) 5-1.5 MG/5ML syrup Take 5 mLs by mouth every 6 (six) hours as needed for cough. 07/29/15   Roxy Horseman, PA-C  lisinopril (PRINIVIL,ZESTRIL) 20 MG tablet Take 20 mg by mouth daily.    Historical Provider, MD  ondansetron (ZOFRAN ODT) 4 MG disintegrating tablet Take 1 tablet (4 mg total) by mouth every 4 (four) hours as needed for nausea or vomiting. 04/29/15   Carrolyn Leigh, PA-C  polyethylene glycol powder (GLYCOLAX/MIRALAX) powder Take 17 g by mouth daily. 05/27/15   Minna Antis, MD   BP 136/77 mmHg  Pulse 74  Temp(Src) 98.4  F (36.9 C) (Oral)  Resp 20  SpO2 100%  LMP 09/03/2012 Physical Exam  Constitutional: She appears well-developed and well-nourished. No distress.  HENT:  Head: Normocephalic.  Right Ear: External ear normal.  Left Ear: External ear normal.  Mildly erythematous, no tonsillar exudate, no abscess, no stridor, uvula is midline  TMs clear bilaterally  Eyes: Conjunctivae and EOM are normal. Pupils are equal, round, and reactive to light.  Neck: Normal range of motion. Neck supple.   Cardiovascular: Normal rate, regular rhythm and normal heart sounds.  Exam reveals no gallop and no friction rub.   No murmur heard. Pulmonary/Chest: Effort normal and breath sounds normal. No stridor. No respiratory distress. She has no wheezes. She has no rales. She exhibits no tenderness.  CTAB  Abdominal: Soft. Bowel sounds are normal. She exhibits no distension. There is no tenderness.  Musculoskeletal: Normal range of motion. She exhibits no tenderness.  Neurological: She is alert.  Skin: Skin is warm and dry. No rash noted. She is not diaphoretic.  Psychiatric: She has a normal mood and affect. Her behavior is normal. Judgment and thought content normal.  Nursing note and vitals reviewed.   ED Course  Procedures (including critical care time) Labs Review Labs Reviewed  CBG MONITORING, ED     MDM   Final diagnoses:  Viral syndrome    Patients symptoms are consistent with URI vs flu, likely viral etiology. Discussed that antibiotics are not indicated for viral infections. Pt will be discharged with symptomatic treatment.  Verbalizes understanding and is agreeable with plan. Pt is hemodynamically stable & in NAD prior to dc.     Roxy Horseman, PA-C 07/29/15 1610  Blane Ohara, MD 07/29/15 [phone]

## 2015-07-29 NOTE — ED Notes (Addendum)
Pt reports generalized body aches, fever since Saturday night. Denies n/v. Took 650 mg tylenol at 0700 this morning.

## 2015-11-11 ENCOUNTER — Encounter (HOSPITAL_COMMUNITY): Payer: Self-pay | Admitting: *Deleted

## 2015-11-11 ENCOUNTER — Emergency Department (HOSPITAL_COMMUNITY)
Admission: EM | Admit: 2015-11-11 | Discharge: 2015-11-11 | Disposition: A | Discharge status: Home / Self Care | Payer: BLUE CROSS/BLUE SHIELD | Attending: Emergency Medicine | Admitting: Emergency Medicine

## 2015-11-11 DIAGNOSIS — E119 Type 2 diabetes mellitus without complications: Secondary | ICD-10-CM | POA: Insufficient documentation

## 2015-11-11 DIAGNOSIS — Z7901 Long term (current) use of anticoagulants: Secondary | ICD-10-CM | POA: Insufficient documentation

## 2015-11-11 DIAGNOSIS — L03116 Cellulitis of left lower limb: Secondary | ICD-10-CM | POA: Insufficient documentation

## 2015-11-11 DIAGNOSIS — Y998 Other external cause status: Secondary | ICD-10-CM | POA: Insufficient documentation

## 2015-11-11 DIAGNOSIS — S80862A Insect bite (nonvenomous), left lower leg, initial encounter: Secondary | ICD-10-CM | POA: Diagnosis present

## 2015-11-11 DIAGNOSIS — Y9289 Other specified places as the place of occurrence of the external cause: Secondary | ICD-10-CM | POA: Insufficient documentation

## 2015-11-11 DIAGNOSIS — Y9389 Activity, other specified: Secondary | ICD-10-CM | POA: Diagnosis not present

## 2015-11-11 DIAGNOSIS — W57XXXA Bitten or stung by nonvenomous insect and other nonvenomous arthropods, initial encounter: Secondary | ICD-10-CM | POA: Diagnosis not present

## 2015-11-11 DIAGNOSIS — L03119 Cellulitis of unspecified part of limb: Secondary | ICD-10-CM

## 2015-11-11 DIAGNOSIS — S90562A Insect bite (nonvenomous), left ankle, initial encounter: Secondary | ICD-10-CM | POA: Diagnosis not present

## 2015-11-11 DIAGNOSIS — E669 Obesity, unspecified: Secondary | ICD-10-CM | POA: Diagnosis not present

## 2015-11-11 DIAGNOSIS — I10 Essential (primary) hypertension: Secondary | ICD-10-CM | POA: Diagnosis not present

## 2015-11-11 DIAGNOSIS — Z79899 Other long term (current) drug therapy: Secondary | ICD-10-CM | POA: Diagnosis not present

## 2015-11-11 MED ORDER — CEPHALEXIN 500 MG PO CAPS: 500.0000 mg | ORAL_CAPSULE | Freq: Four times a day (QID) | ORAL | Status: DC

## 2015-11-11 MED ORDER — HYDROCORTISONE 1 % EX CREA: TOPICAL_CREAM | CUTANEOUS | Status: DC

## 2015-11-11 NOTE — ED Notes (Signed)
Pt states she believes she was bit by an insect on her left lower leg. Pt endorses a throbbing pain and itching. There is an area of erythema about the size of a silver dollar.

## 2015-11-11 NOTE — Discharge Instructions (Signed)
Cellulitis °Cellulitis is an infection of the skin and the tissue under the skin. The infected area is usually red and tender. This happens most often in the arms and lower legs. °HOME CARE  °· Take your antibiotic medicine as told. Finish the medicine even if you start to feel better. °· Keep the infected arm or leg raised (elevated). °· Put a warm cloth on the area up to 4 times per day. °· Only take medicines as told by your doctor. °· Keep all doctor visits as told. °GET HELP IF: °· You see red streaks on the skin coming from the infected area. °· Your red area gets bigger or turns a dark color. °· Your bone or joint under the infected area is painful after the skin heals. °· Your infection comes back in the same area or different area. °· You have a puffy (swollen) bump in the infected area. °· You have new symptoms. °· You have a fever. °GET HELP RIGHT AWAY IF:  °· You feel very sleepy. °· You throw up (vomit) or have watery poop (diarrhea). °· You feel sick and have muscle aches and pains. °  °This information is not intended to replace advice given to you by your health care provider. Make sure you discuss any questions you have with your health care provider. °  °Document Released: 11/17/2007 Document Revised: 02/19/2015 Document Reviewed: 08/16/2011 °Elsevier Interactive Patient Education ©2016 Elsevier Inc. ° °

## 2015-11-11 NOTE — ED Provider Notes (Signed)
CSN: [id number]     Arrival date & time 11/11/15  2006 History  By signing my name below, I, Phillis Haggis, attest that this documentation has been prepared under the direction and in the presence of Felicie Morn, NP-C. Electronically Signed: Phillis Haggis, ED Scribe. 11/11/2015. 8:55 PM.   Chief Complaint  Patient presents with  . Insect Bite   Patient is a 50 y.o. female presenting with animal bite. The history is provided by the patient. No language interpreter was used.  Animal Bite Contact animal:  Insect Location:  Leg Leg injury location:  L lower leg Pain details:    Quality:  Itching   Severity:  Mild   Timing:  Constant   Progression:  Worsening Incident location:  Outside Ineffective treatments:  None tried Associated symptoms: rash   Associated symptoms: no fever   HPI Comments: Natasha Doyle is a 50 y.o. female with a hx of HTN and DM who presents to the Emergency Department complaining of an insect bite to the left lower leg onset one day ago. She does not know what bit her, but she was outside in a paddle boat when she was bitten. She reports associated throbbing pain, redness, and itching. She states that the pain was radiating up and down her leg yesterday. She has not tried anything for her symptoms. She denies fever, chills, or SOB. She denies allergies to medications.   Past Medical History  Diagnosis Date  . Hypertension   . Obesity   . Diabetes mellitus without complication [facility])    Past Surgical History  Procedure Laterality Date  . Cholecystectomy    . Breath tek h pylori N/A 10/22/2014    Procedure: BREATH TEK H PYLORI;  Surgeon: Gaynelle Adu, MD;  Location: [facility];  Service: General;  Laterality: N/A;  . Laparoscopic gastric sleeve resection N/A 04/29/2015    Procedure: LAPAROSCOPIC GASTRIC SLEEVE RESECTION;  Surgeon: Geoffry Paradise, MD;  Location: [facility];  Service: General;  Laterality: N/A;   Family History  Problem Relation Age of Onset  .  Cancer Mother     breast  . Heart disease Brother   . Hyperlipidemia Brother   . Hypertension Brother    Social History  Substance Use Topics  . Smoking status: Never Smoker   . Smokeless tobacco: Never Used  . Alcohol Use: Yes     Comment: occ   OB History    Gravida Para Term Preterm AB TAB SAB Ectopic Multiple Living   0 0 0 0 0 0 2     Review of Systems  Constitutional: Negative for fever and chills.  Respiratory: Negative for shortness of breath.   Skin: Positive for rash.  All other systems reviewed and are negative.  Allergies  Review of patient's allergies indicates no known allergies.  Home Medications   Prior to Admission medications   Medication Sig Start Date End Date Taking? Authorizing Provider  enoxaparin (LOVENOX) 40 MG/0.4ML injection Inject 0.4 mLs (40 mg total) into the skin daily. 04/29/15   Carrolyn Leigh, PA-C  HYDROcodone-acetaminophen (HYCET) 7.5-325 mg/15 ml solution Take 15 mLs by mouth 4 (four) times daily as needed for moderate pain. 04/29/15 04/28/16  Carrolyn Leigh, PA-C  HYDROcodone-homatropine (HYCODAN) 5-1.5 MG/5ML syrup Take 5 mLs by mouth every 6 (six) hours as needed for cough. 07/29/15   Roxy Horseman, PA-C  lisinopril (PRINIVIL,ZESTRIL) 20 MG tablet Take 20 mg by mouth daily.    Historical Provider, MD  ondansetron (ZOFRAN ODT) 4 MG disintegrating tablet Take 1 tablet (4 mg total) by mouth every 4 (four) hours as needed for nausea or vomiting. 04/29/15   Carrolyn LeighSarah A Stout, PA-C  polyethylene glycol powder (GLYCOLAX/MIRALAX) powder Take 17 g by mouth daily. 05/27/15   Minna AntisKevin Paduchowski, MD   BP 138/73 mmHg  Pulse 98  Resp 24  SpO2 99%  LMP 09/03/2012 Physical Exam  Constitutional: She is oriented to person, place, and time. She appears well-developed and well-nourished. No distress.  HENT:  Head: Normocephalic and atraumatic.  Eyes: Conjunctivae and EOM are normal. Pupils are equal, round, and reactive to light.  Neck: Normal range  of motion. Neck supple.  Cardiovascular: Normal rate and regular rhythm.   Pulmonary/Chest: Effort normal and breath sounds normal.  Musculoskeletal: Normal range of motion.  Neurological: She is alert and oriented to person, place, and time.  Skin: Skin is warm and dry.  5x3 cm area of erythema to the medial aspect of the left ankle; no drainage  Psychiatric: She has a normal mood and affect. Her behavior is normal.  Nursing note and vitals reviewed.   ED Course  Procedures (including critical care time) DIAGNOSTIC STUDIES: Oxygen Saturation is 99% on RA, normal by my interpretation.    COORDINATION OF CARE: 8:55 PM-Discussed treatment plan which includes topical steroid cream with pt at bedside and pt agreed to plan.   Labs Review Labs Reviewed - No data to display  Imaging Review No results found. I have personally reviewed and evaluated these images and lab results as part of my medical decision-making.   EKG Interpretation None      MDM   Final diagnoses:  None  Patient presentation consistent with cellulitis. Afebrile. No tachycardia, hypotension or other symptoms suggestive of severe infection. Area has been demarcated and pt advised to follow up for wound check in 2-3 days, sooner for worsening systemic symptoms, new lymphangitis, or significant spread of erythema past line of demarcation. Will discharge with keflex. Return precautions discussed. Pt appears safe for discharge.   I personally performed the services described in this documentation, which was scribed in my presence. The recorded information has been reviewed and is accurate.    Felicie Mornavid Florence Yeung, NP 11/12/15 [id number]  Bethann BerkshireJoseph Zammit, MD 11/13/15 [phone]

## 2015-11-11 NOTE — ED Notes (Signed)
Pt is in stable condition upon d/c and ambulates from ED. 

## 2016-02-07 ENCOUNTER — Emergency Department (HOSPITAL_COMMUNITY): Payer: BLUE CROSS/BLUE SHIELD

## 2016-02-07 ENCOUNTER — Encounter (HOSPITAL_COMMUNITY): Payer: Self-pay | Admitting: *Deleted

## 2016-02-07 ENCOUNTER — Emergency Department (HOSPITAL_COMMUNITY)
Admission: EM | Admit: 2016-02-07 | Discharge: 2016-02-07 | Disposition: A | Discharge status: Home / Self Care | Payer: BLUE CROSS/BLUE SHIELD | Attending: Emergency Medicine | Admitting: Emergency Medicine

## 2016-02-07 DIAGNOSIS — I10 Essential (primary) hypertension: Secondary | ICD-10-CM | POA: Insufficient documentation

## 2016-02-07 DIAGNOSIS — E119 Type 2 diabetes mellitus without complications: Secondary | ICD-10-CM | POA: Insufficient documentation

## 2016-02-07 DIAGNOSIS — K219 Gastro-esophageal reflux disease without esophagitis: Secondary | ICD-10-CM | POA: Insufficient documentation

## 2016-02-07 DIAGNOSIS — R42 Dizziness and giddiness: Secondary | ICD-10-CM

## 2016-02-07 LAB — BASIC METABOLIC PANEL
ANION GAP: 5 (ref 5–15)
BUN: 8 mg/dL (ref 6–20)
CALCIUM: 9.4 mg/dL (ref 8.9–10.3)
CO2: 25 mmol/L (ref 22–32)
CREATININE: 0.82 mg/dL (ref 0.44–1.00)
Chloride: 108 mmol/L (ref 101–111)
GFR calc non Af Amer: >60 mL/min (ref >60)
Glucose, Bld: 106 mg/dL — ABNORMAL HIGH (ref 65–99)
Potassium: 4.2 mmol/L (ref 3.5–5.1)
SODIUM: 138 mmol/L (ref 135–145)

## 2016-02-07 LAB — CBC
HCT: 34.7 % — ABNORMAL LOW (ref 36.0–46.0)
HEMOGLOBIN: 11.1 g/dL — AB (ref 12.0–15.0)
MCH: 26.6 pg (ref 26.0–34.0)
MCHC: 32 g/dL (ref 30.0–36.0)
MCV: 83.2 fL (ref 78.0–100.0)
PLATELETS: 315 10*3/uL (ref 150–400)
RBC: 4.17 MIL/uL (ref 3.87–5.11)
RDW: 15.5 % (ref 11.5–15.5)
WBC: 5.8 10*3/uL (ref 4.0–10.5)

## 2016-02-07 LAB — I-STAT TROPONIN, ED: TROPONIN I, POC: 0 ng/mL (ref 0.00–0.08)

## 2016-02-07 MED ORDER — RANITIDINE HCL 150 MG PO CAPS: 150.0000 mg | ORAL_CAPSULE | Freq: Every day | ORAL | 0 refills | Status: DC

## 2016-02-07 NOTE — ED Triage Notes (Signed)
Pt reports heartburn since yesterday and lightheadedness starting a hour ago. Pt denies n/v, shortness of breath.

## 2016-02-07 NOTE — ED Provider Notes (Signed)
[facility] Provider Note   CSN: [id number] Arrival date & time: 02/07/16  2036   History   Chief Complaint Chief Complaint  Patient presents with  . Heartburn  . Dizziness    HPI Natasha Doyle is a 50 y.o. female.  50 year old female with history of obesity status post gastric surgery with gastric sleeve resection and resulting heartburn since November 2016, diabetes, hypertension presenting with complaints of "indigestion" and lightheadedness.  Lightheadedness was primary reason for visit today. Onset was yesterday. Waxing and waning. 2 episodes reported. None currently, states has resolved now. Described as lightheadedness rather than vertigo. Brought on by position changes, leaning forward and then standing up. Relieved by rest. Moderate severity. Denies associated headache, hearing changes or tinnitus, vision changes, local numbness or weakness.  Indigestion- described as chest discomfort and burning. Located in central chest. Nonradiating. Mild severity. Worse with certain foods. No associated nausea, vomiting, diaphoresis, shortness of breath, cough, fevers, leg swelling. Patient states this is completely unchanged from November, no new qualities reported. She is confident this is exactly like her indigestion that she has had since getting her gastric surgery. It was relieved somewhat with a trial period of Zantac over-the-counter but she has been out of this medication.     The history is provided by the patient and medical records.    Past Medical History:  Diagnosis Date  . Diabetes mellitus without complication (HCC)   . Hypertension   . Obesity     Patient Active Problem List   Diagnosis Date Noted  . Morbid obesity (HCC) 04/29/2015  . High blood pressure 09/28/2013  . Obesities, morbid (HCC) 09/28/2013    Past Surgical History:  Procedure Laterality Date  . BREATH TEK H PYLORI N/A 10/22/2014   Procedure: BREATH TEK H PYLORI;  Surgeon: Gaynelle Adu,  MD;  Location: [facility];  Service: General;  Laterality: N/A;  . CHOLECYSTECTOMY    . LAPAROSCOPIC GASTRIC SLEEVE RESECTION N/A 04/29/2015   Procedure: LAPAROSCOPIC GASTRIC SLEEVE RESECTION;  Surgeon: Geoffry Paradise, MD;  Location: [facility];  Service: General;  Laterality: N/A;    OB History    Gravida Para Term Preterm AB Living   2 2 2  0 0 2   SAB TAB Ectopic Multiple Live Births   0 0 0 0         Home Medications    Prior to Admission medications   Medication Sig Start Date End Date Taking? Authorizing Provider  cephALEXin (KEFLEX) 500 MG capsule Take 1 capsule (500 mg total) by mouth 4 (four) times daily. 11/11/15   Felicie Morn, NP  enoxaparin (LOVENOX) 40 MG/0.4ML injection Inject 0.4 mLs (40 mg total) into the skin daily. 04/29/15   Carrolyn Leigh, PA-C  HYDROcodone-acetaminophen (HYCET) 7.5-325 mg/15 ml solution Take 15 mLs by mouth 4 (four) times daily as needed for moderate pain. 04/29/15 04/28/16  Carrolyn Leigh, PA-C  HYDROcodone-homatropine (HYCODAN) 5-1.5 MG/5ML syrup Take 5 mLs by mouth every 6 (six) hours as needed for cough. 07/29/15   Roxy Horseman, PA-C  hydrocortisone cream 1 % Apply to affected area 2 times daily 11/11/15   Felicie Morn, NP  lisinopril (PRINIVIL,ZESTRIL) 20 MG tablet Take 20 mg by mouth daily.    Historical Provider, MD  ondansetron (ZOFRAN ODT) 4 MG disintegrating tablet Take 1 tablet (4 mg total) by mouth every 4 (four) hours as needed for nausea or vomiting. 04/29/15   Carrolyn Leigh, PA-C  polyethylene glycol powder (GLYCOLAX/MIRALAX) powder Take  17 g by mouth daily. 05/27/15   Minna AntisKevin Paduchowski, MD  ranitidine (ZANTAC) 150 MG capsule Take 1 capsule (150 mg total) by mouth daily. 02/07/16   Urban GibsonJenny Quatavious Rossa, MD    Family History Family History  Problem Relation Age of Onset  . Cancer Mother     breast  . Heart disease Brother   . Hyperlipidemia Brother   . Hypertension Brother     Social History Social History  Substance Use Topics  . Smoking  status: Never Smoker  . Smokeless tobacco: Never Used  . Alcohol use Yes     Comment: occ     Allergies   Review of patient's allergies indicates no known allergies.   Review of Systems Review of Systems  Constitutional: Negative for chills, diaphoresis and fever.  HENT: Negative for sore throat.   Eyes: Negative for visual disturbance.  Respiratory: Negative for cough and shortness of breath.   Cardiovascular: Positive for chest pain. Negative for palpitations and leg swelling.  Gastrointestinal: Negative for abdominal pain, nausea and vomiting.  Genitourinary: Negative for dysuria.  Musculoskeletal: Negative for neck pain.  Skin: Negative for rash.  Allergic/Immunologic: Negative for immunocompromised state.  Neurological: Positive for light-headedness. Negative for seizures, syncope, facial asymmetry, speech difficulty, weakness and headaches.  Hematological: Does not bruise/bleed easily.  All other systems reviewed and are negative.    Physical Exam Updated Vital Signs BP 148/82   Pulse 63   Temp 98.5 F (36.9 C) (Oral)   Resp 15   Ht 5\' 3"  (1.6 m)   Wt 89 kg   LMP 09/03/2012   SpO2 100%   BMI 34.78 kg/m   Physical Exam  Constitutional: She is oriented to person, place, and time. She appears well-developed and well-nourished. No distress.  HENT:  Head: Normocephalic and atraumatic.  Eyes: Conjunctivae are normal.  Neck: Neck supple.  Cardiovascular: Normal rate and regular rhythm.   No murmur heard. Pulmonary/Chest: Effort normal and breath sounds normal. No respiratory distress. She exhibits no tenderness.  Abdominal: Soft. There is no tenderness.  Musculoskeletal: She exhibits no edema.  Neurological: She is alert and oriented to person, place, and time. She has normal strength and normal reflexes. She displays no tremor. No cranial nerve deficit or sensory deficit. She displays a negative Romberg sign. Coordination (intact and normal FTN, rapid hand  alternating movements, HTS) and gait (normal routine and tandem gait) normal. GCS eye subscore is 4. GCS verbal subscore is 5. GCS motor subscore is 6.  Skin: Skin is warm and dry. She is not diaphoretic.  Psychiatric: She has a normal mood and affect.  Nursing note and vitals reviewed.    ED Treatments / Results  Labs (all labs ordered are listed, but only abnormal results are displayed) Labs Reviewed  BASIC METABOLIC PANEL - Abnormal; Notable for the following:       Result Value   Glucose, Bld 106 (*)    All other components within normal limits  CBC - Abnormal; Notable for the following:    Hemoglobin 11.1 (*)    HCT 34.7 (*)    All other components within normal limits  I-STAT TROPOININ, ED    EKG  EKG Interpretation  Date/Time:  Saturday February 07 2016 20:42:43 EDT Ventricular Rate:  60 PR Interval:  186 QRS Duration: 78 QT Interval:  392 QTC Calculation: 392 R Axis:   74 Text Interpretation:  Normal sinus rhythm Normal ECG Confirmed by ZAVITZ MD, JOSHUA ([id number]) on 02/07/2016 10:43:59 PM  Radiology Dg Chest 2 View  Result Date: 02/07/2016 CLINICAL DATA:  Heartburn, worse after hiatal hernia repair in 2016. Lightheadedness and chills. History of gastric sleeve surgery with weight loss of greater than 100 pounds. EXAM: CHEST  2 VIEW COMPARISON:  10/22/2014 FINDINGS: The heart size and mediastinal contours are within normal limits. Both lungs are clear. The visualized skeletal structures are unremarkable. IMPRESSION: No active cardiopulmonary disease. Electronically Signed   By: Burman Nieves M.D.   On: 02/07/2016 21:27    Procedures Procedures (including critical care time)  Medications Ordered in ED Medications - No data to display   Initial Impression / Assessment and Plan / ED Course  I have reviewed the triage vital signs and the nursing notes.  Pertinent labs & imaging results that were available during my care of the patient were reviewed by me  and considered in my medical decision making (see chart for details).  Clinical Course    50 year old female with history of obesity status post gastric surgery with gastric sleeve resection and resulting heartburn since November 2016, diabetes, hypertension presenting with complaints of "indigestion" and lightheadedness as above. AF, VSS. Well appearing overall. Completely normal neurologic exam. No lightheadedness now. Baseline heartburn type of symptoms only.   EKG within normal limits, screening troponin from triage negative. History and exam consistent with GI etiology/GERD for chest discomfort. Patient states pain is completely unchanged from November 2016 and she is confident that this is related to her ongoing "indigestion". No new concerning changes or new characteristics. Minimal pain currently. She has responded well to Zantac in the past and is waiting for her prescription from her PCP for this. We will give prescription for some Zantac here today.   Lightheadedness- This primary reason for visit today. Completely normal neurologic exam, doubt vertigo or central etiology. No tinnitus/imbalance issues/hearing changes to suggest peripheral vertigo. Symptoms have resolved. It was episodic and related to position changes. Possibly vasovagal / very transient cerebral hypoperfusion after leaning forward. She is not orthostatic. Normal electrolytes, glucose. Advised to ensure adequate fluid intake and f/u with PCP if this returns.   Strict return precautions discussed. Dc in stable condition.   Case discussed with Dr. Siri Cole, who oversaw management of this patient.   Final Clinical Impressions(s) / ED Diagnoses   Final diagnoses:  Gastroesophageal reflux disease, esophagitis presence not specified  Lightheadedness    New Prescriptions Discharge Medication List as of 02/07/2016 10:46 PM    START taking these medications   Details  ranitidine (ZANTAC) 150 MG capsule Take 1 capsule (150  mg total) by mouth daily., Starting Sat 02/07/2016, Print           Urban Gibson, MD 02/09/16 1050    Blane Ohara, MD 02/10/16 [phone]

## 2016-05-16 ENCOUNTER — Encounter (HOSPITAL_COMMUNITY): Payer: Self-pay | Admitting: Emergency Medicine

## 2016-05-16 ENCOUNTER — Emergency Department (HOSPITAL_COMMUNITY)
Admission: EM | Admit: 2016-05-16 | Discharge: 2016-05-16 | Disposition: A | Discharge status: Home / Self Care | Payer: Self-pay | Attending: Physician Assistant | Admitting: Physician Assistant

## 2016-05-16 DIAGNOSIS — M79674 Pain in right toe(s): Secondary | ICD-10-CM | POA: Insufficient documentation

## 2016-05-16 DIAGNOSIS — I10 Essential (primary) hypertension: Secondary | ICD-10-CM | POA: Insufficient documentation

## 2016-05-16 DIAGNOSIS — E119 Type 2 diabetes mellitus without complications: Secondary | ICD-10-CM | POA: Insufficient documentation

## 2016-05-16 DIAGNOSIS — Z79899 Other long term (current) drug therapy: Secondary | ICD-10-CM | POA: Insufficient documentation

## 2016-05-16 DIAGNOSIS — Z7901 Long term (current) use of anticoagulants: Secondary | ICD-10-CM | POA: Insufficient documentation

## 2016-05-16 MED ORDER — DICLOFENAC SODIUM 3 % TD GEL: 1.0000 "application " | Freq: Two times a day (BID) | TRANSDERMAL | 0 refills | Status: DC

## 2016-05-16 MED ORDER — ACETAMINOPHEN 325 MG PO TABS: 650.0000 mg | ORAL_TABLET | Freq: Four times a day (QID) | ORAL | 0 refills | Status: DC | PRN

## 2016-05-16 NOTE — ED Provider Notes (Signed)
[facility] Provider Note    By signing my name below, I, Earmon Phoenix, attest that this documentation has been prepared under the direction and in the presence of Will Tiara Maultsby, PA-C. Electronically Signed: Earmon Phoenix, ED Scribe. 05/16/16. 1:04 PM.    History   Chief Complaint Chief Complaint  Patient presents with  . Toe Pain    The history is provided by the patient and medical records. No language interpreter was used.    HPI Comments:  Natasha Doyle is an obese 50 y.o. female with PMHx of DM who presents to the Emergency Department complaining of right great toe pain that began five days ago. She denies any injury or trauma to her toes or feet. She reports associated right anterior thigh pain. She has not taken anything for pain. Moving the toe or wearing shoes increases her pain. She denies alleviating factors. She denies redness or warmth of the toe, bruising, numbness, tingling or weakness of the right great toe or right foot, calf swelling or pain. She denies trauma, injury or fall. She reports occasional alcohol use. She denies h/o gout.   Past Medical History:  Diagnosis Date  . Diabetes mellitus without complication (HCC)   . Hypertension   . Obesity     Patient Active Problem List   Diagnosis Date Noted  . Morbid obesity (HCC) 04/29/2015  . High blood pressure 09/28/2013  . Obesities, morbid (HCC) 09/28/2013    Past Surgical History:  Procedure Laterality Date  . BREATH TEK H PYLORI N/A 10/22/2014   Procedure: BREATH TEK H PYLORI;  Surgeon: Gaynelle Adu, MD;  Location: [facility];  Service: General;  Laterality: N/A;  . CHOLECYSTECTOMY    . LAPAROSCOPIC GASTRIC SLEEVE RESECTION N/A 04/29/2015   Procedure: LAPAROSCOPIC GASTRIC SLEEVE RESECTION;  Surgeon: Geoffry Paradise, MD;  Location: [facility];  Service: General;  Laterality: N/A;    OB History    Gravida Para Term Preterm AB Living   2 2 2  0 0 2   SAB TAB Ectopic Multiple Live Births   0 0  0 0         Home Medications    Prior to Admission medications   Medication Sig Start Date End Date Taking? Authorizing Provider  acetaminophen (TYLENOL) 325 MG tablet Take 2 tablets (650 mg total) by mouth every 6 (six) hours as needed for mild pain or moderate pain. 05/16/16   Everlene Farrier, PA-C  cephALEXin (KEFLEX) 500 MG capsule Take 1 capsule (500 mg total) by mouth 4 (four) times daily. 11/11/15   Felicie Morn, NP  Diclofenac Sodium 3 % GEL Place 1 application onto the skin 2 (two) times daily. To affected area. 05/16/16   Everlene Farrier, PA-C  enoxaparin (LOVENOX) 40 MG/0.4ML injection Inject 0.4 mLs (40 mg total) into the skin daily. 04/29/15   Carrolyn Leigh, PA-C  HYDROcodone-homatropine (HYCODAN) 5-1.5 MG/5ML syrup Take 5 mLs by mouth every 6 (six) hours as needed for cough. 07/29/15   Roxy Horseman, PA-C  hydrocortisone cream 1 % Apply to affected area 2 times daily 11/11/15   Felicie Morn, NP  lisinopril (PRINIVIL,ZESTRIL) 20 MG tablet Take 20 mg by mouth daily.    Historical Provider, MD  ondansetron (ZOFRAN ODT) 4 MG disintegrating tablet Take 1 tablet (4 mg total) by mouth every 4 (four) hours as needed for nausea or vomiting. 04/29/15   Carrolyn Leigh, PA-C  polyethylene glycol powder (GLYCOLAX/MIRALAX) powder Take 17 g by mouth daily. 05/27/15  Minna AntisKevin Paduchowski, MD  ranitidine (ZANTAC) 150 MG capsule Take 1 capsule (150 mg total) by mouth daily. 02/07/16   Urban GibsonJenny Beatty, MD    Family History Family History  Problem Relation Age of Onset  . Cancer Mother     breast  . Heart disease Brother   . Hyperlipidemia Brother   . Hypertension Brother     Social History Social History  Substance Use Topics  . Smoking status: Never Smoker  . Smokeless tobacco: Never Used  . Alcohol use Yes     Comment: occ     Allergies   Patient has no known allergies.   Review of Systems Review of Systems  Constitutional: Negative for fever.  Respiratory: Negative for shortness of  breath.   Cardiovascular: Negative for chest pain and leg swelling.  Musculoskeletal: Positive for arthralgias.  Skin: Negative for color change and wound.  Neurological: Negative for weakness and numbness.     Physical Exam Updated Vital Signs BP 164/82 (BP Location: Right Arm)   Pulse 80   Temp 98.4 F (36.9 C) (Oral)   Resp 16   Ht 5\' 3"  (1.6 m)   Wt 193 lb 6 oz (87.7 kg)   LMP 09/03/2012   SpO2 100%   BMI 34.25 kg/m   Physical Exam  Constitutional: She appears well-developed and well-nourished. No distress.  Nontoxic-appearing.  HENT:  Head: Normocephalic and atraumatic.  Eyes: Right eye exhibits no discharge. Left eye exhibits no discharge.  Cardiovascular: Normal rate, regular rhythm and intact distal pulses.   Bilateral DP pulses intact.  Pulmonary/Chest: Effort normal. No respiratory distress.  Musculoskeletal: Normal range of motion. She exhibits tenderness. She exhibits no edema or deformity.  Pinpoint tenderness to MTP joint of right great toe. No erythema, edema, warmth or deformity. Good ROM of her toes. No calf edema or tenderness.   Neurological: She is alert. Coordination normal.  Skin: Skin is warm and dry. Capillary refill takes less than 2 seconds. No rash noted. She is not diaphoretic. No erythema. No pallor.  Psychiatric: She has a normal mood and affect. Her behavior is normal.  Nursing note and vitals reviewed.    ED Treatments / Results  DIAGNOSTIC STUDIES: Oxygen Saturation is 100% on RA, normal by my interpretation.   COORDINATION OF CARE: 12:58 PM- Will prescribe Diclofenac gel and advised pt to follow up with PCP. Advised pt to only take OTC Tylenol for additional pain. Pt verbalizes understanding and agrees to plan.  Medications - No data to display  Labs (all labs ordered are listed, but only abnormal results are displayed) Labs Reviewed - No data to display  EKG  EKG Interpretation None       Radiology No results  found.  Procedures Procedures (including critical care time)  Medications Ordered in ED Medications - No data to display   Initial Impression / Assessment and Plan / ED Course  I have reviewed the triage vital signs and the nursing notes.  Pertinent labs & imaging results that were available during my care of the patient were reviewed by me and considered in my medical decision making (see chart for details).  Clinical Course     Patient here for right great toe pain that began five days ago. Explained to pt that due to no known injury or trauma, imaging is not indicated at this time. Suspect possible gout. Point TTP to MCP joint of great toe. Pt advised to follow up with PCP. Patient given prescription for Diclofenac  Gel and tylenol due to hx of gastric bypass. She is not on lovenox. I advised that if her symptoms persist she may require x-ray by primary care provider. Patient will be discharged home & is agreeable with above plan. Returns precautions discussed. Pt appears safe for discharge.  I personally performed the services described in this documentation, which was scribed in my presence. The recorded information has been reviewed and is accurate.     Final Clinical Impressions(s) / ED Diagnoses   Final diagnoses:  Great toe pain, right    New Prescriptions New Prescriptions   ACETAMINOPHEN (TYLENOL) 325 MG TABLET    Take 2 tablets (650 mg total) by mouth every 6 (six) hours as needed for mild pain or moderate pain.   DICLOFENAC SODIUM 3 % GEL    Place 1 application onto the skin 2 (two) times daily. To affected area.     Everlene FarrierWilliam Yandriel Boening, PA-C 05/16/16 1311    Courteney Lyn Mackuen, MD 05/16/16 1800

## 2016-05-16 NOTE — ED Triage Notes (Signed)
PT. STATED, I HAVE no idea what happened to my big toe and its making my left thigh hurt.

## 2016-05-16 NOTE — ED Notes (Signed)
Declined W/C at D/C and was escorted to lobby by RN. 

## 2016-07-06 ENCOUNTER — Encounter (HOSPITAL_COMMUNITY): Payer: Self-pay | Admitting: *Deleted

## 2016-07-06 ENCOUNTER — Ambulatory Visit (HOSPITAL_COMMUNITY)
Admission: EM | Admit: 2016-07-06 | Discharge: 2016-07-06 | Disposition: A | Discharge status: Home / Self Care | Payer: BLUE CROSS/BLUE SHIELD | Attending: Family Medicine | Admitting: Family Medicine

## 2016-07-06 DIAGNOSIS — G5602 Carpal tunnel syndrome, left upper limb: Secondary | ICD-10-CM

## 2016-07-06 MED ORDER — MELOXICAM 7.5 MG PO TABS: 7.5000 mg | ORAL_TABLET | Freq: Two times a day (BID) | ORAL | 1 refills | Status: DC

## 2016-07-06 NOTE — Discharge Instructions (Signed)
See orthopedist if further problems °

## 2016-07-06 NOTE — ED Provider Notes (Signed)
[facility]    CSN: [id number] Arrival date & time: 07/06/16  1752     History   Chief Complaint Chief Complaint  Patient presents with  . Arm Problem    HPI Natasha Minorsamela D Doyle is a 51 y.o. female.   The history is provided by the patient.  Wrist Pain  This is a new problem. The current episode started yesterday. The problem has not changed since onset.Pertinent negatives include no chest pain, no abdominal pain and no headaches. The symptoms are aggravated by twisting.    Past Medical History:  Diagnosis Date  . Diabetes mellitus without complication (HCC)   . Hypertension   . Obesity     Patient Active Problem List   Diagnosis Date Noted  . Morbid obesity (HCC) 04/29/2015  . High blood pressure 09/28/2013  . Obesities, morbid (HCC) 09/28/2013    Past Surgical History:  Procedure Laterality Date  . BREATH TEK H PYLORI N/A 10/22/2014   Procedure: BREATH TEK H PYLORI;  Surgeon: Gaynelle AduEric Wilson, MD;  [facility];  Service: General;  Laterality: N/A;  . CHOLECYSTECTOMY    . HERNIA REPAIR    . LAPAROSCOPIC GASTRIC SLEEVE RESECTION N/A 04/29/2015   Procedure: LAPAROSCOPIC GASTRIC SLEEVE RESECTION;  Surgeon: Geoffry ParadiseJon M Bruce, MD;  Location: [facility];  Service: General;  Laterality: N/A;    OB History    Gravida Para Term Preterm AB Living   2 2 2  0 0 2   SAB TAB Ectopic Multiple Live Births   0 0 0 0         Home Medications    Prior to Admission medications   Medication Sig Start Date End Date Taking? Authorizing Provider  lisinopril (PRINIVIL,ZESTRIL) 20 MG tablet Take 20 mg by mouth daily.   Yes Historical Provider, MD  acetaminophen (TYLENOL) 325 MG tablet Take 2 tablets (650 mg total) by mouth every 6 (six) hours as needed for mild pain or moderate pain. 05/16/16   Everlene FarrierWilliam Dansie, PA-C  cephALEXin (KEFLEX) 500 MG capsule Take 1 capsule (500 mg total) by mouth 4 (four) times daily. 11/11/15   Felicie Mornavid Smith, NP  Diclofenac Sodium 3 % GEL Place 1  application onto the skin 2 (two) times daily. To affected area. 05/16/16   Everlene FarrierWilliam Dansie, PA-C  enoxaparin (LOVENOX) 40 MG/0.4ML injection Inject 0.4 mLs (40 mg total) into the skin daily. 04/29/15   Carrolyn LeighSarah A Stout, PA-C  HYDROcodone-homatropine (HYCODAN) 5-1.5 MG/5ML syrup Take 5 mLs by mouth every 6 (six) hours as needed for cough. 07/29/15   Roxy Horsemanobert Browning, PA-C  hydrocortisone cream 1 % Apply to affected area 2 times daily 11/11/15   Felicie Mornavid Smith, NP  meloxicam (MOBIC) 7.5 MG tablet Take 1 tablet (7.5 mg total) by mouth 2 (two) times daily after a meal. 07/06/16   Linna HoffJames D Glennis Montenegro, MD  ondansetron (ZOFRAN ODT) 4 MG disintegrating tablet Take 1 tablet (4 mg total) by mouth every 4 (four) hours as needed for nausea or vomiting. 04/29/15   Carrolyn LeighSarah A Stout, PA-C  polyethylene glycol powder (GLYCOLAX/MIRALAX) powder Take 17 g by mouth daily. 05/27/15   Minna AntisKevin Paduchowski, MD  ranitidine (ZANTAC) 150 MG capsule Take 1 capsule (150 mg total) by mouth daily. 02/07/16   Urban GibsonJenny Beatty, MD    Family History Family History  Problem Relation Age of Onset  . Cancer Mother     breast  . Heart disease Brother   . Hyperlipidemia Brother   . Hypertension Brother  Social History Social History  Substance Use Topics  . Smoking status: Never Smoker  . Smokeless tobacco: Never Used  . Alcohol use Yes     Comment: occ     Allergies   Patient has no known allergies.   Review of Systems Review of Systems  Constitutional: Negative.   Cardiovascular: Negative for chest pain.  Gastrointestinal: Negative for abdominal pain.  Musculoskeletal: Positive for myalgias. Negative for joint swelling.  Skin: Negative.   Neurological: Negative.  Negative for headaches.  All other systems reviewed and are negative.    Physical Exam Triage Vital Signs ED Triage Vitals [07/06/16 1911]  Enc Vitals Group     BP 147/69     Pulse Rate 83     Resp 16     Temp 98.7 F (37.1 C)     Temp Source Oral     SpO2 98 %       Weight      Height      Head Circumference      Peak Flow      Pain Score      Pain Loc      Pain Edu?      Excl. in GC?    No data found.   Updated Vital Signs BP 147/69 (BP Location: Right Arm)   Pulse 83   Temp 98.7 F (37.1 C) (Oral)   Resp 16   LMP 09/03/2012   SpO2 98%   Visual Acuity Right Eye Distance:   Left Eye Distance:   Bilateral Distance:    Right Eye Near:   Left Eye Near:    Bilateral Near:     Physical Exam  Constitutional: She is oriented to person, place, and time. She appears well-developed and well-nourished. No distress.  Musculoskeletal: Normal range of motion. She exhibits tenderness.  Neurological: She is alert and oriented to person, place, and time.  Skin: Skin is warm and dry.  Nursing note and vitals reviewed.    UC Treatments / Results  Labs (all labs ordered are listed, but only abnormal results are displayed) Labs Reviewed - No data to display  EKG  EKG Interpretation None       Radiology No results found.  Procedures Procedures (including critical care time)  Medications Ordered in UC Medications - No data to display   Initial Impression / Assessment and Plan / UC Course  I have reviewed the triage vital signs and the nursing notes.  Pertinent labs & imaging results that were available during my care of the patient were reviewed by me and considered in my medical decision making (see chart for details).       Final Clinical Impressions(s) / UC Diagnoses   Final diagnoses:  Acute carpal tunnel syndrome of left wrist    New Prescriptions Discharge Medication List as of 07/06/2016  7:40 PM    START taking these medications   Details  meloxicam (MOBIC) 7.5 MG tablet Take 1 tablet (7.5 mg total) by mouth 2 (two) times daily after a meal., Starting Tue 07/06/2016, Normal         Linna Hoff, MD 07/15/16 1406

## 2016-07-06 NOTE — ED Triage Notes (Signed)
Patient reports intermittent left sided arm tingling sensation, stroke scale is negative. Reports she has had heart burn but that is normal for her.

## 2017-03-09 ENCOUNTER — Encounter (HOSPITAL_COMMUNITY): Payer: Self-pay | Admitting: Emergency Medicine

## 2017-03-09 ENCOUNTER — Emergency Department (HOSPITAL_COMMUNITY): Payer: BLUE CROSS/BLUE SHIELD

## 2017-03-09 DIAGNOSIS — Z5321 Procedure and treatment not carried out due to patient leaving prior to being seen by health care provider: Secondary | ICD-10-CM | POA: Diagnosis not present

## 2017-03-09 DIAGNOSIS — R0789 Other chest pain: Secondary | ICD-10-CM | POA: Diagnosis present

## 2017-03-09 LAB — CBC
HCT: 33.9 % — ABNORMAL LOW (ref 36.0–46.0)
HEMOGLOBIN: 11 g/dL — AB (ref 12.0–15.0)
MCH: 26.8 pg (ref 26.0–34.0)
MCHC: 32.4 g/dL (ref 30.0–36.0)
MCV: 82.5 fL (ref 78.0–100.0)
Platelets: 268 10*3/uL (ref 150–400)
RBC: 4.11 MIL/uL (ref 3.87–5.11)
RDW: 15.1 % (ref 11.5–15.5)
WBC: 6.4 10*3/uL (ref 4.0–10.5)

## 2017-03-09 LAB — I-STAT TROPONIN, ED: TROPONIN I, POC: 0 ng/mL (ref 0.00–0.08)

## 2017-03-09 NOTE — ED Triage Notes (Signed)
Patient reports mid/left chest pain with SOB , productive cough , emesis and diaphoresis onset this evening .

## 2017-03-10 ENCOUNTER — Emergency Department (HOSPITAL_COMMUNITY)
Admission: EM | Admit: 2017-03-10 | Discharge: 2017-03-10 | Disposition: A | Discharge status: Home / Self Care | Payer: BLUE CROSS/BLUE SHIELD | Attending: Emergency Medicine | Admitting: Emergency Medicine

## 2017-03-10 LAB — BASIC METABOLIC PANEL
Anion gap: 8 (ref 5–15)
BUN: 14 mg/dL (ref 6–20)
CHLORIDE: 102 mmol/L (ref 101–111)
CO2: 28 mmol/L (ref 22–32)
CREATININE: 0.81 mg/dL (ref 0.44–1.00)
Calcium: 9.1 mg/dL (ref 8.9–10.3)
Glucose, Bld: 104 mg/dL — ABNORMAL HIGH (ref 65–99)
POTASSIUM: 3.9 mmol/L (ref 3.5–5.1)
SODIUM: 138 mmol/L (ref 135–145)

## 2017-03-10 NOTE — ED Notes (Signed)
Patient informed me they where leaving

## 2017-03-11 ENCOUNTER — Inpatient Hospital Stay (HOSPITAL_COMMUNITY)
Admission: AD | Admit: 2017-03-11 | Discharge: 2017-03-11 | Disposition: A | Discharge status: Home / Self Care | Payer: BLUE CROSS/BLUE SHIELD | Source: Ambulatory Visit | Attending: Obstetrics & Gynecology | Admitting: Obstetrics & Gynecology

## 2017-03-11 ENCOUNTER — Encounter (HOSPITAL_COMMUNITY): Payer: Self-pay | Admitting: *Deleted

## 2017-03-11 DIAGNOSIS — E119 Type 2 diabetes mellitus without complications: Secondary | ICD-10-CM | POA: Insufficient documentation

## 2017-03-11 DIAGNOSIS — Z9049 Acquired absence of other specified parts of digestive tract: Secondary | ICD-10-CM | POA: Diagnosis not present

## 2017-03-11 DIAGNOSIS — N898 Other specified noninflammatory disorders of vagina: Secondary | ICD-10-CM

## 2017-03-11 DIAGNOSIS — A599 Trichomoniasis, unspecified: Secondary | ICD-10-CM | POA: Diagnosis not present

## 2017-03-11 DIAGNOSIS — I10 Essential (primary) hypertension: Secondary | ICD-10-CM | POA: Diagnosis not present

## 2017-03-11 DIAGNOSIS — Z9889 Other specified postprocedural states: Secondary | ICD-10-CM | POA: Insufficient documentation

## 2017-03-11 DIAGNOSIS — Z79899 Other long term (current) drug therapy: Secondary | ICD-10-CM | POA: Diagnosis not present

## 2017-03-11 LAB — URINALYSIS, ROUTINE W REFLEX MICROSCOPIC
BACTERIA UA: NONE SEEN
BILIRUBIN URINE: NEGATIVE
Glucose, UA: NEGATIVE mg/dL
HGB URINE DIPSTICK: NEGATIVE
Ketones, ur: NEGATIVE mg/dL
NITRITE: NEGATIVE
Protein, ur: NEGATIVE mg/dL
SPECIFIC GRAVITY, URINE: 1.026 (ref 1.005–1.030)
pH: 6 (ref 5.0–8.0)

## 2017-03-11 LAB — WET PREP, GENITAL
Clue Cells Wet Prep HPF POC: NONE SEEN
SPERM: NONE SEEN
YEAST WET PREP: NONE SEEN

## 2017-03-11 LAB — POCT PREGNANCY, URINE: Preg Test, Ur: NEGATIVE

## 2017-03-11 MED ORDER — LISINOPRIL 20 MG PO TABS: 20.0000 mg | ORAL_TABLET | Freq: Every day | ORAL | 0 refills | Status: DC

## 2017-03-11 NOTE — Discharge Instructions (Signed)

## 2017-03-11 NOTE — MAU Note (Signed)
PT  SAYS  SHE WENT  TO [facility]  ON Tuesday  FOR  HEARTBURN -   WAITED  6  HRS -     SHE  FELT  BETTER - SO LEFT.    SHE NOW HAS VAG D/C-   STARTED ON Monday - WITH ODOR.- YELLOW .     LAST SEX-   3 WEEKS  AGO.   NO BIRTH CONTROL- HAS BEEN THROUGH MENOPAUSE     GOES TO GYN CLINIC-  SEEN LAST 1.5 YEARS AGO.

## 2017-03-11 NOTE — MAU Provider Note (Signed)
Chief Complaint: Vaginal Discharge   SUBJECTIVE HPI: Natasha Doyle is a 51 y.o. [id number] who presents to MAU with complaints ov vaginal discharge with odor. Patient states that she started noticing the discharge and odor on Monday. She last had unprotected intercourse about a month ago. Describes the discharge as yellow. Tried an Art therapist without improvement. She denies any history of ever having STDs. Denies fever, n/v, vaginal bleeding, abdominal pain. Denies pregnancy states that she is postmenopausal.    Past Medical History:  Diagnosis Date  . Diabetes mellitus without complication (HCC)   . Hypertension   . Obesity    OB History  Gravida Para Term Preterm AB Living  0 0 2  SAB TAB Ectopic Multiple Live Births  0 0 0 0 2    # Outcome Date GA Lbr Len/2nd Weight Sex Delivery Anes PTL Lv  2 Term 02/14/91 [redacted]w[redacted]d   M Vag-Spont     1 Term 11/03/87 [redacted]w[redacted]d   F Vag-Spont        Past Surgical History:  Procedure Laterality Date  . BREATH TEK H PYLORI N/A 10/22/2014   Procedure: BREATH TEK H PYLORI;  Surgeon: Gaynelle Adu, MD;  Location[facility];  Service: General;  Laterality: N/A;  . CHOLECYSTECTOMY    . HERNIA REPAIR    . LAPAROSCOPIC GASTRIC SLEEVE RESECTION N/A 04/29/2015   Procedure: LAPAROSCOPIC GASTRIC SLEEVE RESECTION;  Surgeon: Geoffry Paradise, MD;  Location: [facility];  Service: General;  Laterality: N/A;   Social History   Social History  . Marital status: Single    Spouse name: N/A  . Number of children: N/A  . Years of education: N/A   Occupational History  . Not on file.   Social History Main Topics  . Smoking status: Never Smoker  . Smokeless tobacco: Never Used  . Alcohol use Yes     Comment: occ  . Drug use: No  . Sexual activity: Not on file   Other Topics Concern  . Not on file   Social History Narrative  . No narrative on file   No current facility-administered medications on file prior to encounter.    Current Outpatient Prescriptions  on File Prior to Encounter  Medication Sig Dispense Refill  . acetaminophen (TYLENOL) 325 MG tablet Take 2 tablets (650 mg total) by mouth every 6 (six) hours as needed for mild pain or moderate pain. 60 tablet 0  . cephALEXin (KEFLEX) 500 MG capsule Take 1 capsule (500 mg total) by mouth 4 (four) times daily. 20 capsule 0  . Diclofenac Sodium 3 % GEL Place 1 application onto the skin 2 (two) times daily. To affected area. 50 g 0  . enoxaparin (LOVENOX) 40 MG/0.4ML injection Inject 0.4 mLs (40 mg total) into the skin daily. 14 Syringe 0  . HYDROcodone-homatropine (HYCODAN) 5-1.5 MG/5ML syrup Take 5 mLs by mouth every 6 (six) hours as needed for cough. 120 mL 0  . hydrocortisone cream 1 % Apply to affected area 2 times daily 15 g 0  . meloxicam (MOBIC) 7.5 MG tablet Take 1 tablet (7.5 mg total) by mouth 2 (two) times daily after a meal. 30 tablet 1  . ondansetron (ZOFRAN ODT) 4 MG disintegrating tablet Take 1 tablet (4 mg total) by mouth every 4 (four) hours as needed for nausea or vomiting. 20 tablet 0  . polyethylene glycol powder (GLYCOLAX/MIRALAX) powder Take 17 g by mouth daily. 255 g 0  . ranitidine (ZANTAC) 150 MG  capsule Take 1 capsule (150 mg total) by mouth daily. 30 capsule 0   No Known Allergies  I have reviewed the past Medical Hx, Surgical Hx, Social Hx, Allergies and Medications.   REVIEW OF SYSTEMS All systems reviewed and are negative for acute change except as noted in the HPI.   OBJECTIVE BP (!) 155/78   Pulse 77   Temp 98.7 F (37.1 C) (Oral)   Resp 20   Ht  (1.6 m)   Wt 88.8 kg (195 lb 12 oz)   LMP 09/03/2012   BMI 34.68 kg/m    PHYSICAL EXAM Constitutional: Well-developed, well-nourished female in no acute distress.  Cardiovascular: normal rate and rhythm, pulses intact Respiratory: normal rate and effort.  GI: Abd soft, non-tender, non-distended. MS: Extremities nontender, no edema, normal ROM Neurologic: Alert and oriented x 4. No focal  deficits Pelvic: declined  LAB RESULTS Results for orders placed or performed during the hospital encounter of 03/11/17 (from the past 24 hour(s))  Urinalysis, Routine w reflex microscopic     Status: Abnormal   Collection Time: 03/11/17  7:35 PM  Result Value Ref Range   Color, Urine YELLOW YELLOW   APPearance CLEAR CLEAR   Specific Gravity, Urine 1.026 1.005 - 1.030   pH 6.0 5.0 - 8.0   Glucose, UA NEGATIVE NEGATIVE mg/dL   Hgb urine dipstick NEGATIVE NEGATIVE   Bilirubin Urine NEGATIVE NEGATIVE   Ketones, ur NEGATIVE NEGATIVE mg/dL   Protein, ur NEGATIVE NEGATIVE mg/dL   Nitrite NEGATIVE NEGATIVE   Leukocytes, UA MODERATE (A) NEGATIVE   RBC / HPF 0-5 0 - 5 RBC/hpf   WBC, UA 6-30 0 - 5 WBC/hpf   Bacteria, UA NONE SEEN NONE SEEN   Squamous Epithelial / LPF 0-5 (A) NONE SEEN   Mucus PRESENT   Wet prep, genital     Status: Abnormal   Collection Time: 03/11/17  7:38 PM  Result Value Ref Range   Yeast Wet Prep HPF POC NONE SEEN NONE SEEN   Trich, Wet Prep PRESENT (A) NONE SEEN   Clue Cells Wet Prep HPF POC NONE SEEN NONE SEEN   WBC, Wet Prep HPF POC MANY (A) NONE SEEN   Sperm NONE SEEN   Pregnancy, urine POC     Status: None   Collection Time: 03/11/17  7:41 PM  Result Value Ref Range   Preg Test, Ur NEGATIVE NEGATIVE    IMAGING Dg Chest 2 View  Result Date: 03/09/2017 CLINICAL DATA:  51 y/o  F; mid chest pain. EXAM: CHEST  2 VIEW COMPARISON:  02/07/2016 chest radiograph FINDINGS: Stable heart size and mediastinal contours are within normal limits. Both lungs are clear. The visualized skeletal structures are unremarkable. IMPRESSION: No acute pulmonary process identified. Electronically Signed   By: Mitzi Hansen M.D.   On: 03/09/2017 23:55    MAU COURSE Vitals and nursing notes reviewed - elevated BP but is out of medication I have ordered labs/imaging and reviewed them Patient self swabbed; declined pelvic exam Wet prep positive for Trich Upreg negative UA  unremarkable GC/Chl pending  MDM Plan of care reviewed with patient, including labs and tests ordered and medical treatment.   ASSESSMENT 1. Vaginal discharge   2. Vaginal odor   3. Trichimoniasis     PLAN Discharge home in stable condition Rx given for flagyl (paper script) Refilled BP medication Discussed need to inform her partner of results Counseled on return precautions Handout given   Caryl Ada, DO OB Fellow  Faculty Practice, [facility] 03/11/2017, 8:32 PM

## 2017-03-14 LAB — GC/CHLAMYDIA PROBE AMP (~~LOC~~) NOT AT ARMC
Chlamydia: NEGATIVE
Neisseria Gonorrhea: NEGATIVE

## 2017-03-30 DIAGNOSIS — Z9884 Bariatric surgery status: Secondary | ICD-10-CM | POA: Insufficient documentation

## 2017-04-20 ENCOUNTER — Ambulatory Visit (HOSPITAL_COMMUNITY)
Admission: EM | Admit: 2017-04-20 | Discharge: 2017-04-20 | Disposition: A | Discharge status: Home / Self Care | Payer: BLUE CROSS/BLUE SHIELD | Attending: Family Medicine | Admitting: Family Medicine

## 2017-04-20 ENCOUNTER — Encounter (HOSPITAL_COMMUNITY): Payer: Self-pay | Admitting: Emergency Medicine

## 2017-04-20 DIAGNOSIS — K0889 Other specified disorders of teeth and supporting structures: Secondary | ICD-10-CM | POA: Diagnosis not present

## 2017-04-20 MED ORDER — AMOXICILLIN-POT CLAVULANATE 875-125 MG PO TABS: 1.0000 | ORAL_TABLET | Freq: Two times a day (BID) | ORAL | 0 refills | Status: DC

## 2017-04-20 MED ORDER — HYDROCODONE-ACETAMINOPHEN 5-325 MG PO TABS: 1.0000 | ORAL_TABLET | Freq: Four times a day (QID) | ORAL | 0 refills | Status: DC | PRN

## 2017-04-20 NOTE — ED Triage Notes (Signed)
PT has left upper and lower toothache that started Saturday. PT has dental appt next Tuesday.

## 2017-04-27 NOTE — ED Provider Notes (Signed)
  [facility]   [id number] 04/20/17 Arrival Time: 1922  ASSESSMENT & PLAN:  1. Pain, dental     Meds ordered this encounter  Medications  . HYDROcodone-acetaminophen (NORCO/VICODIN) 5-325 MG tablet    Sig: Take 1 tablet every 6 (six) hours as needed by mouth for moderate pain or severe pain.    Dispense:  6 tablet    Refill:  0  . amoxicillin-clavulanate (AUGMENTIN) 875-125 MG tablet    Sig: Take 1 tablet every 12 (twelve) hours by mouth.    Dispense:  14 tablet    Refill:  0    [place] Controlled Substances Registry consulted for this patient. I feel the risk/benefit ratio today is favorable for proceeding with this prescription for a controlled substance. Medication sedation precautions given.  Dental resource written instructions given. She will schedule dental evaluation as soon as possible.  Reviewed expectations re: course of current medical issues. Questions answered. Outlined signs and symptoms indicating need for more acute intervention. Patient verbalized understanding. After Visit Summary given.   SUBJECTIVE:  Natasha Doyle is a 51 y.o. female who reports abrupt onset of left upper dental pain. Present for a few days. Worse when chewing. Decreased PO intake. Afebrile. Has dental appointment for next week. No drainage from mouth. OTC Tylenol without much relief.  ROS: As per HPI.  OBJECTIVE:  Vitals:   04/20/17 1959  BP: 128/74  Pulse: 66  Resp: 16  Temp: 98.4 F (36.9 C)  TempSrc: Oral  SpO2: 100%  Weight: 172 lb (78 kg)    General appearance: alert; no distress HENT: normocephalic; atraumatic; dentition: fair; gingival hypertrophy over L upper gum; no fluctuance Neck: supple without LAD Lungs: normal respirations Skin: warm and dry Psychological: alert and cooperative; normal mood and affect   No Known Allergies  Past Medical History:  Diagnosis Date  . Diabetes mellitus without complication (HCC)   . Hypertension   . Obesity     Social History   Socioeconomic History  . Marital status: Single    Spouse name: Not on file  . Number of children: Not on file  . Years of education: Not on file  . Highest education level: Not on file  Social Needs  . Financial resource strain: Not on file  . Food insecurity - worry: Not on file  . Food insecurity - inability: Not on file  . Transportation needs - medical: Not on file  . Transportation needs - non-medical: Not on file  Occupational History  . Not on file  Tobacco Use  . Smoking status: Never Smoker  . Smokeless tobacco: Never Used  Substance and Sexual Activity  . Alcohol use: Yes    Comment: occ  . Drug use: No  . Sexual activity: Not on file  Other Topics Concern  . Not on file  Social History Narrative  . Not on file   Family History  Problem Relation Age of Onset  . Cancer Mother        breast  . Heart disease Brother   . Hyperlipidemia Brother   . Hypertension Brother    Past Surgical History:  Procedure Laterality Date  . CHOLECYSTECTOMY    . HERNIA REPAIR       Mardella LaymanHagler, Kamaria Lucia, MD 04/27/17 [phone]

## 2017-07-12 ENCOUNTER — Ambulatory Visit (HOSPITAL_COMMUNITY)
Admission: EM | Admit: 2017-07-12 | Discharge: 2017-07-12 | Disposition: A | Discharge status: Home / Self Care | Payer: BLUE CROSS/BLUE SHIELD | Attending: Family Medicine | Admitting: Family Medicine

## 2017-07-12 ENCOUNTER — Encounter (HOSPITAL_COMMUNITY): Payer: Self-pay | Admitting: Emergency Medicine

## 2017-07-12 DIAGNOSIS — J111 Influenza due to unidentified influenza virus with other respiratory manifestations: Secondary | ICD-10-CM

## 2017-07-12 DIAGNOSIS — R69 Illness, unspecified: Secondary | ICD-10-CM

## 2017-07-12 MED ORDER — HYDROCODONE-HOMATROPINE 5-1.5 MG/5ML PO SYRP: 5.0000 mL | ORAL_SOLUTION | Freq: Four times a day (QID) | ORAL | 0 refills | Status: DC | PRN

## 2017-07-12 MED ORDER — OSELTAMIVIR PHOSPHATE 75 MG PO CAPS: 75.0000 mg | ORAL_CAPSULE | Freq: Two times a day (BID) | ORAL | 0 refills | Status: DC

## 2017-07-12 NOTE — ED Triage Notes (Signed)
PT reports fever and sinus symptoms. Temp earlier was 102.3. PT took tylenol at [id number]

## 2017-07-12 NOTE — ED Provider Notes (Signed)
  [facility]   [id number] 07/12/17 Arrival Time: 1626  ASSESSMENT & PLAN:  1. Influenza-like illness    Meds ordered this encounter  Medications  . oseltamivir (TAMIFLU) 75 MG capsule    Sig: Take 1 capsule (75 mg total) by mouth every 12 (twelve) hours.    Dispense:  10 capsule    Refill:  0  . HYDROcodone-homatropine (HYCODAN) 5-1.5 MG/5ML syrup    Sig: Take 5 mLs by mouth every 6 (six) hours as needed for cough.    Dispense:  90 mL    Refill:  0   Work note given. Cough medication sedation precautions. Discussed typical duration of symptoms. OTC symptom care as needed. Ensure adequate fluid intake and rest. May f/u with PCP or here as needed.  Reviewed expectations re: course of current medical issues. Questions answered. Outlined signs and symptoms indicating need for more acute intervention. Patient verbalized understanding. After Visit Summary given.   SUBJECTIVE: History from: patient.  Natasha Doyle is a 52 y.o. female who presents with complaint of nasal congestion, post-nasal drainage, and a persistent dry cough. Onset abrupt, this morning. Overall fatigued with body aches. SOB: none. Wheezing: none. Fever: yes. Overall normal PO intake without n/v. Sick contacts: no. OTC treatment: Tylenol for fever.  Received flu shot this year: no.  Social History   Tobacco Use  Smoking Status Never Smoker  Smokeless Tobacco Never Used    ROS: As per HPI.   OBJECTIVE:  Vitals:   07/12/17 1716  BP: (!) 141/72  Pulse: 83  Resp: 16  Temp: 99.9 F (37.7 C)  TempSrc: Oral  SpO2: 100%  Weight: 176 lb (79.8 kg)    General appearance: alert; appears fatigued HEENT: nasal congestion; clear runny nose; throat irritation secondary to post-nasal drainage Neck: supple without LAD Lungs: unlabored respirations, symmetrical air entry; cough: moderate; no respiratory distress Skin: warm and dry Psychological: alert and cooperative; normal mood and  affect  No Known Allergies  Past Medical History:  Diagnosis Date  . Diabetes mellitus without complication (HCC)   . Hypertension   . Obesity    Family History  Problem Relation Age of Onset  . Cancer Mother        breast  . Heart disease Brother   . Hyperlipidemia Brother   . Hypertension Brother    Social History   Socioeconomic History  . Marital status: Single    Spouse name: Not on file  . Number of children: Not on file  . Years of education: Not on file  . Highest education level: Not on file  Social Needs  . Financial resource strain: Not on file  . Food insecurity - worry: Not on file  . Food insecurity - inability: Not on file  . Transportation needs - medical: Not on file  . Transportation needs - non-medical: Not on file  Occupational History  . Not on file  Tobacco Use  . Smoking status: Never Smoker  . Smokeless tobacco: Never Used  Substance and Sexual Activity  . Alcohol use: Yes    Comment: occ  . Drug use: No  . Sexual activity: Not on file  Other Topics Concern  . Not on file  Social History Narrative  . Not on file           Mardella LaymanHagler, Mela Perham, MD 07/12/17 1746

## 2017-07-12 NOTE — Discharge Instructions (Signed)

## 2018-02-18 ENCOUNTER — Encounter (HOSPITAL_COMMUNITY): Payer: Self-pay | Admitting: Emergency Medicine

## 2018-02-18 ENCOUNTER — Emergency Department (HOSPITAL_COMMUNITY)
Admission: EM | Admit: 2018-02-18 | Discharge: 2018-02-19 | Disposition: A | Discharge status: Home / Self Care | Payer: BLUE CROSS/BLUE SHIELD | Attending: Emergency Medicine | Admitting: Emergency Medicine

## 2018-02-18 DIAGNOSIS — Z79899 Other long term (current) drug therapy: Secondary | ICD-10-CM | POA: Insufficient documentation

## 2018-02-18 DIAGNOSIS — R252 Cramp and spasm: Secondary | ICD-10-CM

## 2018-02-18 DIAGNOSIS — E119 Type 2 diabetes mellitus without complications: Secondary | ICD-10-CM | POA: Insufficient documentation

## 2018-02-18 DIAGNOSIS — E876 Hypokalemia: Secondary | ICD-10-CM

## 2018-02-18 DIAGNOSIS — I10 Essential (primary) hypertension: Secondary | ICD-10-CM | POA: Insufficient documentation

## 2018-02-18 LAB — BASIC METABOLIC PANEL
ANION GAP: 9 (ref 5–15)
BUN: 13 mg/dL (ref 6–20)
CALCIUM: 9 mg/dL (ref 8.9–10.3)
CO2: 21 mmol/L — ABNORMAL LOW (ref 22–32)
CREATININE: 0.95 mg/dL (ref 0.44–1.00)
Chloride: 109 mmol/L (ref 98–111)
Glucose, Bld: 112 mg/dL — ABNORMAL HIGH (ref 70–99)
Potassium: 3.3 mmol/L — ABNORMAL LOW (ref 3.5–5.1)
Sodium: 139 mmol/L (ref 135–145)

## 2018-02-18 LAB — CBC
HCT: 35.4 % — ABNORMAL LOW (ref 36.0–46.0)
Hemoglobin: 10.9 g/dL — ABNORMAL LOW (ref 12.0–15.0)
MCH: 27 pg (ref 26.0–34.0)
MCHC: 30.8 g/dL (ref 30.0–36.0)
MCV: 87.6 fL (ref 78.0–100.0)
PLATELETS: 257 10*3/uL (ref 150–400)
RBC: 4.04 MIL/uL (ref 3.87–5.11)
RDW: 14.9 % (ref 11.5–15.5)
WBC: 5.1 10*3/uL (ref 4.0–10.5)

## 2018-02-18 NOTE — ED Provider Notes (Signed)
[facility] EMERGENCY DEPARTMENT Provider Note  CSN: [id number] Arrival date & time: 02/18/18 2058  Chief Complaint(s) cramping in ext/numbness  HPI Natasha Doyle is a 52 y.o. female with a history of hypertension, diet-controlled diabetes, morbid obesity status post gastric sleeve who presents to the emergency department with several hours of extremity cramping and numbness.  Patient reports that she was at a [place] and today and began having bilateral upper and lower extremity cramps.  States that the symptoms improved with activity however every time she sat down the cramps will return.  They finally stopped approximately 2-1/2 hours ago upon arrival to the emergency department.  Has not felt any since.  Patient denied any recent fevers or infections.  No nausea vomiting.  No diarrhea.  No chest pain or shortness of breath.  No focal weakness.  Denied any alcohol intake today.  States that since this started, she has been hydrating.  The history is provided by the patient.    Past Medical History Past Medical History:  Diagnosis Date  . Diabetes mellitus without complication (HCC)    borderline  . Hypertension   . Obesity    Patient Active Problem List   Diagnosis Date Noted  . Morbid obesity (HCC) 04/29/2015  . High blood pressure 09/28/2013  . Obesities, morbid (HCC) 09/28/2013   Home Medication(s) Prior to Admission medications   Medication Sig Start Date End Date Taking? Authorizing Provider  acetaminophen (TYLENOL) 325 MG tablet Take 2 tablets (650 mg total) by mouth every 6 (six) hours as needed for mild pain or moderate pain. 05/16/16   Everlene Farrier, PA-C  amoxicillin-clavulanate (AUGMENTIN) 875-125 MG tablet Take 1 tablet every 12 (twelve) hours by mouth. 04/20/17   Mardella Layman, MD  Diclofenac Sodium 3 % GEL Place 1 application onto the skin 2 (two) times daily. To affected area. 05/16/16   Everlene Farrier, PA-C  enoxaparin (LOVENOX) 40 MG/0.4ML  injection Inject 0.4 mLs (40 mg total) into the skin daily. 04/29/15   Carrolyn Leigh, PA-C  HYDROcodone-acetaminophen (NORCO/VICODIN) 5-325 MG tablet Take 1 tablet every 6 (six) hours as needed by mouth for moderate pain or severe pain. 04/20/17   Mardella Layman, MD  HYDROcodone-homatropine (HYCODAN) 5-1.5 MG/5ML syrup Take 5 mLs by mouth every 6 (six) hours as needed for cough. 07/12/17   Mardella Layman, MD  hydrocortisone cream 1 % Apply to affected area 2 times daily 11/11/15   Felicie Morn, NP  lisinopril (PRINIVIL,ZESTRIL) 20 MG tablet Take 1 tablet (20 mg total) by mouth daily. 03/11/17   Pincus Large, DO  meloxicam (MOBIC) 7.5 MG tablet Take 1 tablet (7.5 mg total) by mouth 2 (two) times daily after a meal. 07/06/16   Kindl, Quita Skye, MD  Multiple Vitamin (MULTIVITAMIN) tablet Take 1 tablet by mouth daily.    [provider]  ondansetron (ZOFRAN ODT) 4 MG disintegrating tablet Take 1 tablet (4 mg total) by mouth every 4 (four) hours as needed for nausea or vomiting. 04/29/15   Carrolyn Leigh, PA-C  oseltamivir (TAMIFLU) 75 MG capsule Take 1 capsule (75 mg total) by mouth every 12 (twelve) hours. 07/12/17   Mardella Layman, MD  polyethylene glycol powder (GLYCOLAX/MIRALAX) powder Take 17 g by mouth daily. 05/27/15   Minna Antis, MD  ranitidine (ZANTAC) 150 MG capsule Take 1 capsule (150 mg total) by mouth daily. 02/07/16   Urban Gibson, MD  Past Surgical History Past Surgical History:  Procedure Laterality Date  . BREATH TEK H PYLORI N/A 10/22/2014   Procedure: BREATH TEK H PYLORI;  Surgeon: Gaynelle Adu, MD;  Location: [facility];  Service: General;  Laterality: N/A;  . CHOLECYSTECTOMY    . HERNIA REPAIR    . LAPAROSCOPIC GASTRIC SLEEVE RESECTION N/A 04/29/2015   Procedure: LAPAROSCOPIC GASTRIC SLEEVE RESECTION;  Surgeon: Geoffry Paradise, MD;  Location:  [facility] ORS;  Service: General;  Laterality: N/A;   Family History Family History  Problem Relation Age of Onset  . Cancer Mother        breast  . Heart disease Brother   . Hyperlipidemia Brother   . Hypertension Brother     Social History Social History   Tobacco Use  . Smoking status: Never Smoker  . Smokeless tobacco: Never Used  Substance Use Topics  . Alcohol use: Yes    Comment: occ  . Drug use: No   Allergies Patient has no known allergies.  Review of Systems Review of Systems All other systems are reviewed and are negative for acute change except as noted in the HPI  Physical Exam Vital Signs  I have reviewed the triage vital signs BP 119/66   Pulse 73   Temp 98.5 F (36.9 C) (Oral)   Resp (!) 21   Ht 5\' 3"  (1.6 m)   Wt 80.7 kg   LMP 09/03/2012   SpO2 100%   BMI 31.53 kg/m   Physical Exam  Constitutional: She is oriented to person, place, and time. She appears well-developed and well-nourished. No distress.  HENT:  Head: Normocephalic and atraumatic.  Right Ear: External ear normal.  Left Ear: External ear normal.  Nose: Nose normal.  Eyes: Pupils are equal, round, and reactive to light. Conjunctivae and EOM are normal. Right eye exhibits no discharge. Left eye exhibits no discharge. No scleral icterus.  Neck: Normal range of motion and phonation normal. Neck supple.  Cardiovascular: Normal rate and regular rhythm. Exam reveals no gallop and no friction rub.  No murmur heard. Pulmonary/Chest: Effort normal and breath sounds normal. No stridor. No respiratory distress. She has no rales.  Abdominal: Soft. She exhibits no distension. There is no tenderness.  Musculoskeletal: Normal range of motion. She exhibits no edema or tenderness.  Neurological: She is alert and oriented to person, place, and time.  Mental Status:  Alert and oriented to person, place, and time.  Attention and concentration normal.  Speech clear.  Recent memory is intact  Cranial  Nerves:  II Visual Fields: Intact to confrontation. Visual fields intact. III, IV, VI: Pupils equal and reactive to light and near. Full eye movement without nystagmus  V Facial Sensation: Normal. No weakness of masticatory muscles  VII: No facial weakness or asymmetry  VIII Auditory Acuity: Grossly normal  IX/X: The uvula is midline; the palate elevates symmetrically  XI: Normal sternocleidomastoid and trapezius strength  XII: The tongue is midline. No atrophy or fasciculations.   Motor System: Muscle Strength: 5/5 and symmetric in the upper and lower extremities. No pronation or drift.  Muscle Tone: Tone and muscle bulk are normal in the upper and lower extremities.   Reflexes: DTRs: 1+ and symmetrical in all four extremities. No Clonus Coordination: Intact finger-to-nose. No tremor.  Sensation: Intact to light touch, and pinprick.  Gait: Routine gait normal.   Skin: Skin is warm and dry. No rash noted. She is not diaphoretic. No erythema.  Psychiatric: She has a normal  mood and affect. Her behavior is normal.  Vitals reviewed.   ED Results and Treatments Labs (all labs ordered are listed, but only abnormal results are displayed) Labs Reviewed  CBC - Abnormal; Notable for the following components:      Result Value   Hemoglobin 10.9 (*)    HCT 35.4 (*)    All other components within normal limits  BASIC METABOLIC PANEL - Abnormal; Notable for the following components:   Potassium 3.3 (*)    CO2 21 (*)    Glucose, Bld 112 (*)    All other components within normal limits                                                                                                                         EKG  EKG Interpretation  Date/Time:  Saturday February 18 2018 21:08:04 EDT Ventricular Rate:  87 PR Interval:  194 QRS Duration: 80 QT Interval:  346 QTC Calculation: 416 R Axis:   63 Text Interpretation:  Normal sinus rhythm Normal ECG No significant change since last tracing  Confirmed by Drema Pry [phone]) on 02/19/2018 12:02:24 AM      Radiology No results found. Pertinent labs & imaging results that were available during my care of the patient were reviewed by me and considered in my medical decision making (see chart for details).  Medications Ordered in ED Medications - No data to display                                                                                                                                  Procedures Procedures  (including critical care time)  Medical Decision Making / ED Course I have reviewed the nursing notes for this encounter and the patient's prior records (if available in EHR or on provided paperwork).    Muscle spasms.  On review of record patient is only on lisinopril.  She is not taking any medications that would cause any spasms.  Patient did report having dietary restrictions and difficulty with oral intake due to the gastric sleeve stating that food feels heavy to her.  Exam was nonfocal.  Doubt CVA.  Screening labs with mild hypokalemia.  Calcium within normal limits.  No other significant electrolyte derangements.  Final Clinical Impression(s) / ED Diagnoses Final diagnoses:  Muscle cramps  Hypokalemia   Disposition: Discharge  Condition: Good  I have discussed the results,  Dx and Tx plan with the patient who expressed understanding and agree(s) with the plan. Discharge instructions discussed at great length. The patient was given strict return precautions who verbalized understanding of the instructions. No further questions at time of discharge.    ED Discharge Orders    None       Follow Up: Alysia Penna, MD [address] [place] [address] [phone]  Schedule an appointment as soon as possible for a visit  As needed      This chart was dictated using voice recognition software.  Despite best efforts to proofread,  errors can occur which can change the documentation  meaning.   Nira Conn, MD 02/19/18 [phone]

## 2018-02-18 NOTE — ED Triage Notes (Signed)
Reports sudden onset of cramping in ext and numbness in left arm after being at the fair today.  Reports numbness is gone and the cramping is improved.  Endorses poor po intake due to hx of gastric sleeve.

## 2018-03-15 ENCOUNTER — Emergency Department (HOSPITAL_COMMUNITY)
Admission: EM | Admit: 2018-03-15 | Discharge: 2018-03-15 | Disposition: A | Discharge status: Home / Self Care | Payer: Self-pay | Attending: Emergency Medicine | Admitting: Emergency Medicine

## 2018-03-15 ENCOUNTER — Other Ambulatory Visit: Payer: Self-pay

## 2018-03-15 ENCOUNTER — Emergency Department (HOSPITAL_COMMUNITY): Payer: Self-pay

## 2018-03-15 ENCOUNTER — Encounter (HOSPITAL_COMMUNITY): Payer: Self-pay | Admitting: *Deleted

## 2018-03-15 DIAGNOSIS — Z9884 Bariatric surgery status: Secondary | ICD-10-CM | POA: Insufficient documentation

## 2018-03-15 DIAGNOSIS — E119 Type 2 diabetes mellitus without complications: Secondary | ICD-10-CM | POA: Insufficient documentation

## 2018-03-15 DIAGNOSIS — Z79899 Other long term (current) drug therapy: Secondary | ICD-10-CM | POA: Insufficient documentation

## 2018-03-15 DIAGNOSIS — M79674 Pain in right toe(s): Secondary | ICD-10-CM | POA: Insufficient documentation

## 2018-03-15 DIAGNOSIS — Z9049 Acquired absence of other specified parts of digestive tract: Secondary | ICD-10-CM | POA: Insufficient documentation

## 2018-03-15 DIAGNOSIS — I10 Essential (primary) hypertension: Secondary | ICD-10-CM | POA: Insufficient documentation

## 2018-03-15 MED ORDER — ACETAMINOPHEN 500 MG PO TABS
1000.0000 mg | ORAL_TABLET | Freq: Once | ORAL | Status: AC
  Administered 2018-03-15: 1000 mg via ORAL
  Filled 2018-03-15: qty 2

## 2018-03-15 NOTE — ED Provider Notes (Signed)
[facility] EMERGENCY DEPARTMENT Provider Note   CSN: [id number] Arrival date & time: 03/15/18  1238     History   Chief Complaint Chief Complaint  Patient presents with  . Toe Pain    HPI Natasha Doyle is a 52 y.o. female.  52 year old female presents with a 3-day history of right great toe pain.  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  She states pain at the right first MTP. She denies numbness, tingling, decreased range of motion, difficulty ambulating.  Patient denies any other injuries.  She has taken some ibuprofen today with minimal improvement in her symptoms.  The history is provided by the patient.  Toe Pain  The current episode started more than 2 days ago. Pertinent negatives include no chest pain, no abdominal pain and no shortness of breath.    Past Medical History:  Diagnosis Date  . Diabetes mellitus without complication (HCC)    borderline  . Hypertension   . Obesity     Patient Active Problem List   Diagnosis Date Noted  . Morbid obesity (HCC) 04/29/2015  . High blood pressure 09/28/2013  . Obesities, morbid (HCC) 09/28/2013    Past Surgical History:  Procedure Laterality Date  . BREATH TEK H PYLORI N/A 10/22/2014   Procedure: BREATH TEK H PYLORI;  Surgeon: Gaynelle Adu, MD;  Location: [facility];  Service: General;  Laterality: N/A;  . CHOLECYSTECTOMY    . HERNIA REPAIR    . LAPAROSCOPIC GASTRIC SLEEVE RESECTION N/A 04/29/2015   Procedure: LAPAROSCOPIC GASTRIC SLEEVE RESECTION;  Surgeon: Geoffry Paradise, MD;  Location: [facility] ORS;  Service: General;  Laterality: N/A;     OB History    Gravida  2   Para  2   Term  2   Preterm  0   AB  0   Living  2     SAB  0   TAB  0   Ectopic  0   Multiple  0   Live Births  2            Home Medications    Prior to Admission medications   Medication Sig Start Date End Date Taking? Authorizing Provider  acetaminophen (TYLENOL) 325 MG  tablet Take 2 tablets (650 mg total) by mouth every 6 (six) hours as needed for mild pain or moderate pain. 05/16/16   Everlene Farrier, PA-C  amoxicillin-clavulanate (AUGMENTIN) 875-125 MG tablet Take 1 tablet every 12 (twelve) hours by mouth. 04/20/17   Mardella Layman, MD  Diclofenac Sodium 3 % GEL Place 1 application onto the skin 2 (two) times daily. To affected area. 05/16/16   Everlene Farrier, PA-C  enoxaparin (LOVENOX) 40 MG/0.4ML injection Inject 0.4 mLs (40 mg total) into the skin daily. 04/29/15   Carrolyn Leigh, PA-C  HYDROcodone-acetaminophen (NORCO/VICODIN) 5-325 MG tablet Take 1 tablet every 6 (six) hours as needed by mouth for moderate pain or severe pain. 04/20/17   Mardella Layman, MD  HYDROcodone-homatropine (HYCODAN) 5-1.5 MG/5ML syrup Take 5 mLs by mouth every 6 (six) hours as needed for cough. 07/12/17   Mardella Layman, MD  hydrocortisone cream 1 % Apply to affected area 2 times daily 11/11/15   Felicie Morn, NP  lisinopril (PRINIVIL,ZESTRIL) 20 MG tablet Take 1 tablet (20 mg total) by mouth daily. 03/11/17   Pincus Large, DO  meloxicam (MOBIC) 7.5 MG tablet Take 1 tablet (7.5 mg total) by mouth 2 (two)  times daily after a meal. 07/06/16   Kindl, Quita Skye, MD  Multiple Vitamin (MULTIVITAMIN) tablet Take 1 tablet by mouth daily.    [provider]  ondansetron (ZOFRAN ODT) 4 MG disintegrating tablet Take 1 tablet (4 mg total) by mouth every 4 (four) hours as needed for nausea or vomiting. 04/29/15   Carrolyn Leigh, PA-C  oseltamivir (TAMIFLU) 75 MG capsule Take 1 capsule (75 mg total) by mouth every 12 (twelve) hours. 07/12/17   Mardella Layman, MD  polyethylene glycol powder (GLYCOLAX/MIRALAX) powder Take 17 g by mouth daily. 05/27/15   Minna Antis, MD  ranitidine (ZANTAC) 150 MG capsule Take 1 capsule (150 mg total) by mouth daily. 02/07/16   Urban Gibson, MD    Family History Family History  Problem Relation Age of Onset  . Cancer Mother        breast  . Heart disease  Brother   . Hyperlipidemia Brother   . Hypertension Brother     Social History Social History   Tobacco Use  . Smoking status: Never Smoker  . Smokeless tobacco: Never Used  Substance Use Topics  . Alcohol use: Yes    Comment: occ  . Drug use: No     Allergies   Patient has no known allergies.   Review of Systems Review of Systems  Constitutional: Negative for chills and fever.  Respiratory: Negative for shortness of breath.   Cardiovascular: Negative for chest pain.  Gastrointestinal: Negative for abdominal pain, nausea and vomiting.  Musculoskeletal: Positive for arthralgias (right great toe pain) and joint swelling. Negative for gait problem.     Physical Exam Updated Vital Signs BP 127/69 (BP Location: Right Arm)   Pulse 80   Temp 98.1 F (36.7 C) (Oral)   Resp 16   Ht 5\' 3"  (1.6 m)   Wt 80.7 kg   LMP 09/03/2012   SpO2 100%   BMI 31.53 kg/m   Physical Exam  Constitutional: She is oriented to person, place, and time. She appears well-developed and well-nourished.  HENT:  Head: Normocephalic and atraumatic.  Eyes: Conjunctivae and EOM are normal.  Neck: Neck supple.  Cardiovascular: Normal rate, regular rhythm and normal heart sounds.  No murmur heard. Pulmonary/Chest: Effort normal and breath sounds normal. No respiratory distress. She has no wheezes. She has no rales.  Abdominal: Soft. Bowel sounds are normal. She exhibits no distension. There is no tenderness.  Musculoskeletal: Normal range of motion. She exhibits tenderness (right great toe). She exhibits no edema (no appreciated edema as compared to other foot) or deformity.  Neurological: She is alert and oriented to person, place, and time.  Skin: Skin is warm and dry. No rash noted. No erythema.  Psychiatric: She has a normal mood and affect. Her behavior is normal.  Nursing note and vitals reviewed.    ED Treatments / Results  Labs (all labs ordered are listed, but only abnormal results are  displayed) Labs Reviewed - No data to display  EKG None  Radiology Dg Foot Complete Right  Result Date: 03/15/2018 CLINICAL DATA:  Crush injury yesterday with swelling of the great toe. EXAM: RIGHT FOOT COMPLETE - 3+ VIEW COMPARISON:  10/15/2008 FINDINGS: No evidence of fracture or dislocation. Soft tissue swelling in the region of the great toe. Degenerative changes of the metatarsal phalangeal joint of the great toe, progressive since 2010. IMPRESSION: No fracture or dislocation. Soft tissue swelling in the region of the medial foot. 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 Electronically Signed   By: Paulina Fusi M.D.   On: 03/15/2018 13:21    Procedures Procedures (including critical care time)  Medications Ordered in ED Medications  acetaminophen (TYLENOL) tablet 1,000 mg (has no administration in time range)     Initial Impression / Assessment and Plan / ED Course  I have reviewed the triage vital signs and the nursing notes.  Pertinent labs & imaging results that were available during my care of the patient were reviewed by me and considered in my medical decision making (see chart for details).     No acute fractures seen on x-ray. Toe with full range of motion, neurovascularly intact.  No evidence of gout, septic arthritis, cellulitis.  Encouraged RICE.   At this time there does not appear to be any evidence of an acute emergency medical condition and the patient appears stable for discharge with appropriate outpatient follow up. Diagnosis was discussed with patient who verbalizes understanding and is agreeable to discharge.    Final Clinical Impressions(s) / ED Diagnoses   Final diagnoses:  None    ED Discharge Orders    None       Clayborne Artist, PA-C 03/15/18 1713    Little, Ambrose Finland, MD 03/16/18 [phone]

## 2018-03-15 NOTE — ED Notes (Signed)
Declined W/C at D/C and was escorted to lobby by RN. 

## 2018-03-15 NOTE — ED Triage Notes (Signed)
Pt reports she hit the Rt great toe on a chair at work today. Swelling to RT foot

## 2018-03-15 NOTE — Discharge Instructions (Signed)
Alternate Tylenol and Motrin for pain. Return to the emergency department for new or worsening symptoms or concerns, such as increased pain, difficulty walking, numbness, tingling or any concerns at all.

## 2018-05-23 ENCOUNTER — Ambulatory Visit (HOSPITAL_COMMUNITY)
Admission: EM | Admit: 2018-05-23 | Discharge: 2018-05-23 | Disposition: A | Discharge status: Home / Self Care | Payer: Self-pay | Attending: Family Medicine | Admitting: Family Medicine

## 2018-05-23 ENCOUNTER — Encounter (HOSPITAL_COMMUNITY): Payer: Self-pay

## 2018-05-23 DIAGNOSIS — B349 Viral infection, unspecified: Secondary | ICD-10-CM | POA: Insufficient documentation

## 2018-05-23 MED ORDER — ONDANSETRON HCL 4 MG PO TABS: 4.0000 mg | ORAL_TABLET | Freq: Four times a day (QID) | ORAL | 0 refills | Status: DC

## 2018-05-23 NOTE — ED Triage Notes (Signed)
Pt presents with NVD, persistent cough, fever, chills, and generalized body aches.

## 2018-05-23 NOTE — ED Provider Notes (Signed)
[facility]    CSN: [id number] Arrival date & time: 05/23/18  1824     History   Chief Complaint Chief Complaint  Patient presents with  . Fever  . NVD  . Cough    HPI Natasha Doyle is a 52 y.o. female.   HPI  2 days of vomiting, diarrhea, abdominal cramps, fever, and body aches.  Highest fever was over 101 .  exposed to 2 family members with "flu" No runny or stuffy nose, sore throat, headache, she had some coughing with no sputum She works in Teacher, music.  CNA. Is compliant with her medications and medical treatments.  Past Medical History:  Diagnosis Date  . Diabetes mellitus without complication (HCC)    borderline  . Hypertension   . Obesity     Patient Active Problem List   Diagnosis Date Noted  . Morbid obesity (HCC) 04/29/2015  . High blood pressure 09/28/2013  . Obesities, morbid (HCC) 09/28/2013    Past Surgical History:  Procedure Laterality Date  . BREATH TEK H PYLORI N/A 10/22/2014   Procedure: BREATH TEK H PYLORI;  Surgeon: Gaynelle Adu, MD;  Location: [facility];  Service: General;  Laterality: N/A;  . CHOLECYSTECTOMY    . HERNIA REPAIR    . LAPAROSCOPIC GASTRIC SLEEVE RESECTION N/A 04/29/2015   Procedure: LAPAROSCOPIC GASTRIC SLEEVE RESECTION;  Surgeon: Geoffry Paradise, MD;  Location: [facility];  Service: General;  Laterality: N/A;    OB History    Gravida  2   Para  2   Term  2   Preterm  0   AB  0   Living  2     SAB  0   TAB  0   Ectopic  0   Multiple  0   Live Births  2            Home Medications    Prior to Admission medications   Medication Sig Start Date End Date Taking? Authorizing Provider  acetaminophen (TYLENOL) 325 MG tablet Take 2 tablets (650 mg total) by mouth every 6 (six) hours as needed for mild pain or moderate pain. 05/16/16   Everlene Farrier, PA-C  Diclofenac Sodium 3 % GEL Place 1 application onto the skin 2 (two) times daily. To affected area. 05/16/16   Everlene Farrier, PA-C    enoxaparin (LOVENOX) 40 MG/0.4ML injection Inject 0.4 mLs (40 mg total) into the skin daily. 04/29/15   Carrolyn Leigh, PA-C  hydrocortisone cream 1 % Apply to affected area 2 times daily 11/11/15   Felicie Morn, NP  lisinopril (PRINIVIL,ZESTRIL) 20 MG tablet Take 1 tablet (20 mg total) by mouth daily. 03/11/17   Pincus Large, DO  meloxicam (MOBIC) 7.5 MG tablet Take 1 tablet (7.5 mg total) by mouth 2 (two) times daily after a meal. 07/06/16   Kindl, Quita Skye, MD  Multiple Vitamin (MULTIVITAMIN) tablet Take 1 tablet by mouth daily.    [provider]  ondansetron (ZOFRAN ODT) 4 MG disintegrating tablet Take 1 tablet (4 mg total) by mouth every 4 (four) hours as needed for nausea or vomiting. 04/29/15   Carrolyn Leigh, PA-C  ondansetron (ZOFRAN) 4 MG tablet Take 1 tablet (4 mg total) by mouth every 6 (six) hours. 05/23/18   Eustace Moore, MD  polyethylene glycol powder (GLYCOLAX/MIRALAX) powder Take 17 g by mouth daily. 05/27/15   Minna Antis, MD  ranitidine (ZANTAC) 150 MG capsule Take 1 capsule (150 mg total) by mouth  daily. 02/07/16   Urban Gibson, MD    Family History Family History  Problem Relation Age of Onset  . Cancer Mother        breast  . Heart disease Brother   . Hyperlipidemia Brother   . Hypertension Brother     Social History Social History   Tobacco Use  . Smoking status: Never Smoker  . Smokeless tobacco: Never Used  Substance Use Topics  . Alcohol use: Yes    Comment: occ  . Drug use: No     Allergies   Patient has no known allergies.   Review of Systems Review of Systems  Constitutional: Positive for fever. Negative for chills.  HENT: Negative for ear pain and sore throat.   Eyes: Negative for pain and visual disturbance.  Respiratory: Negative for cough and shortness of breath.   Cardiovascular: Negative for chest pain and palpitations.  Gastrointestinal: Positive for abdominal pain, diarrhea, nausea and vomiting.  Genitourinary:  Negative for dysuria and hematuria.  Musculoskeletal: Negative for arthralgias and back pain.  Skin: Negative for color change and rash.  Neurological: Negative for seizures and syncope.  Psychiatric/Behavioral: Negative for sleep disturbance.  All other systems reviewed and are negative.    Physical Exam Triage Vital Signs ED Triage Vitals  Enc Vitals Group     BP 05/23/18 1849 120/69     Pulse Rate 05/23/18 1849 82     Resp 05/23/18 1849 20     Temp 05/23/18 1849 98.8 F (37.1 C)     Temp Source 05/23/18 1849 Oral     SpO2 05/23/18 1849 100 %     Weight --      Height --      Head Circumference --      Peak Flow --      Pain Score 05/23/18 1848 6     Pain Loc --      Pain Edu? --      Excl. in GC? --    No data found.  Updated Vital Signs BP 120/69 (BP Location: Right Arm)   Pulse 82   Temp 98.8 F (37.1 C) (Oral)   Resp 20   LMP 09/03/2012   SpO2 100%      Physical Exam  Constitutional: She appears well-developed and well-nourished. No distress.  Looks tired  HENT:  Head: Normocephalic and atraumatic.  Right Ear: External ear normal.  Left Ear: External ear normal.  Mouth/Throat: Oropharynx is clear and moist.  Mouth slightly dry  Eyes: Pupils are equal, round, and reactive to light. Conjunctivae are normal.  Neck: Normal range of motion.  Cardiovascular: Normal rate, regular rhythm and normal heart sounds.  Pulmonary/Chest: Effort normal and breath sounds normal. No respiratory distress.  Abdominal: Soft. She exhibits no distension. There is no tenderness.  Active bowel sounds  Musculoskeletal: Normal range of motion. She exhibits no edema.  Lymphadenopathy:    She has no cervical adenopathy.  Neurological: She is alert.  Skin: Skin is warm and dry.  Psychiatric: She has a normal mood and affect. Her behavior is normal.     UC Treatments / Results  Labs (all labs ordered are listed, but only abnormal results are displayed) Labs Reviewed - No  data to display  EKG None  Radiology No results found.  Procedures Procedures (including critical care time)  Medications Ordered in UC Medications - No data to display  Initial Impression / Assessment and Plan / UC Course  I have reviewed the  triage vital signs and the nursing notes.  Pertinent labs & imaging results that were available during my care of the patient were reviewed by me and considered in my medical decision making (see chart for details).     Reviewed with patient that this is a viral illness.  Will not be improved with antibiotics.  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  Note is given for work.  Symptomatic care discussed.  Importance of fluids.  Return if not improving Final Clinical Impressions(s) / UC Diagnoses   Final diagnoses:  Viral illness     Discharge Instructions     Rest Push fluids Take zofran if needed nausea / vomiting Take the imodium if needed severe diarrhea Tylenol or ibuprofen for pain and fever May return to work when you have no fever and am eating solid foods   ED Prescriptions    Medication Sig Dispense Auth. Provider   ondansetron (ZOFRAN) 4 MG tablet Take 1 tablet (4 mg total) by mouth every 6 (six) hours. 12 tablet Eustace MooreNelson, Chestine Belknap Sue, MD     Controlled Substance Prescriptions [place] Controlled Substance Registry consulted? Not Applicable   Eustace MooreNelson, Torah Pinnock Sue, MD 05/23/18 [phone]

## 2018-05-23 NOTE — Discharge Instructions (Addendum)
Rest Push fluids Take zofran if needed nausea / vomiting Take the imodium if needed severe diarrhea Tylenol or ibuprofen for pain and fever May return to work when you have no fever and am eating solid foods

## 2018-06-13 ENCOUNTER — Emergency Department (HOSPITAL_COMMUNITY): Payer: Self-pay

## 2018-06-13 ENCOUNTER — Encounter (HOSPITAL_COMMUNITY): Payer: Self-pay | Admitting: *Deleted

## 2018-06-13 ENCOUNTER — Emergency Department (HOSPITAL_COMMUNITY)
Admission: EM | Admit: 2018-06-13 | Discharge: 2018-06-13 | Disposition: A | Discharge status: Home / Self Care | Payer: Self-pay | Attending: Emergency Medicine | Admitting: Emergency Medicine

## 2018-06-13 DIAGNOSIS — S90852A Superficial foreign body, left foot, initial encounter: Secondary | ICD-10-CM

## 2018-06-13 DIAGNOSIS — Y929 Unspecified place or not applicable: Secondary | ICD-10-CM | POA: Insufficient documentation

## 2018-06-13 DIAGNOSIS — Y939 Activity, unspecified: Secondary | ICD-10-CM | POA: Insufficient documentation

## 2018-06-13 DIAGNOSIS — E119 Type 2 diabetes mellitus without complications: Secondary | ICD-10-CM | POA: Insufficient documentation

## 2018-06-13 DIAGNOSIS — S91342A Puncture wound with foreign body, left foot, initial encounter: Secondary | ICD-10-CM | POA: Insufficient documentation

## 2018-06-13 DIAGNOSIS — Z23 Encounter for immunization: Secondary | ICD-10-CM | POA: Insufficient documentation

## 2018-06-13 DIAGNOSIS — Y998 Other external cause status: Secondary | ICD-10-CM | POA: Insufficient documentation

## 2018-06-13 DIAGNOSIS — I10 Essential (primary) hypertension: Secondary | ICD-10-CM | POA: Insufficient documentation

## 2018-06-13 DIAGNOSIS — W458XXA Other foreign body or object entering through skin, initial encounter: Secondary | ICD-10-CM | POA: Insufficient documentation

## 2018-06-13 DIAGNOSIS — Z79899 Other long term (current) drug therapy: Secondary | ICD-10-CM | POA: Insufficient documentation

## 2018-06-13 MED ORDER — LISINOPRIL 20 MG PO TABS: 20.0000 mg | ORAL_TABLET | Freq: Every day | ORAL | 0 refills | Status: DC

## 2018-06-13 MED ORDER — LIDOCAINE HCL 2 % IJ SOLN
20.0000 mL | Freq: Once | INTRAMUSCULAR | Status: AC
  Administered 2018-06-13: 400 mg
  Filled 2018-06-13: qty 100

## 2018-06-13 MED ORDER — TETANUS-DIPHTH-ACELL PERTUSSIS 5-2.5-18.5 LF-MCG/0.5 IM SUSP
0.5000 mL | Freq: Once | INTRAMUSCULAR | Status: AC
  Administered 2018-06-13: 0.5 mL via INTRAMUSCULAR
  Filled 2018-06-13: qty 0.5

## 2018-06-13 NOTE — ED Notes (Signed)
Pt stable, ambulatory, states understanding of discharge instructions 

## 2018-06-13 NOTE — ED Provider Notes (Signed)
[facility] EMERGENCY DEPARTMENT Provider Note   CSN: [id number] Arrival date & time: 06/13/18  1191    History   Chief Complaint Chief Complaint  Patient presents with  . Foot Injury    HPI Natasha Doyle is a 52 y.o. female.  HPI   52 year old female presents today with complaints of plantar foot puncture.  Patient notes that she was barefoot when she stepped on her carpet feeling a sharp sensation in her foot.  She was unable to find the object.  She is uncertain when her last tetanus shot was.  She denies any chronic street including diabetes (chart shows borderline diabetes).  She notes she was borderline prior to her bariatric surgery but has not been told she has diabetes since then.  Past Medical History:  Diagnosis Date  . Diabetes mellitus without complication (HCC)    borderline  . Hypertension   . Obesity     Patient Active Problem List   Diagnosis Date Noted  . Morbid obesity (HCC) 04/29/2015  . High blood pressure 09/28/2013  . Obesities, morbid (HCC) 09/28/2013    Past Surgical History:  Procedure Laterality Date  . BREATH TEK H PYLORI N/A 10/22/2014   Procedure: BREATH TEK H PYLORI;  Surgeon: Gaynelle Adu, MD;  Location: [facility];  Service: General;  Laterality: N/A;  . CHOLECYSTECTOMY    . HERNIA REPAIR    . LAPAROSCOPIC GASTRIC SLEEVE RESECTION N/A 04/29/2015   Procedure: LAPAROSCOPIC GASTRIC SLEEVE RESECTION;  Surgeon: Geoffry Paradise, MD;  Location: [facility];  Service: General;  Laterality: N/A;     OB History    Gravida  2   Para  2   Term  2   Preterm  0   AB  0   Living  2     SAB  0   TAB  0   Ectopic  0   Multiple  0   Live Births  2            Home Medications    Prior to Admission medications   Medication Sig Start Date End Date Taking? Authorizing Provider  acetaminophen (TYLENOL) 325 MG tablet Take 2 tablets (650 mg total) by mouth every 6 (six) hours as needed for mild pain or moderate  pain. 05/16/16   Everlene Farrier, PA-C  Diclofenac Sodium 3 % GEL Place 1 application onto the skin 2 (two) times daily. To affected area. 05/16/16   Everlene Farrier, PA-C  enoxaparin (LOVENOX) 40 MG/0.4ML injection Inject 0.4 mLs (40 mg total) into the skin daily. 04/29/15   Carrolyn Leigh, PA-C  hydrocortisone cream 1 % Apply to affected area 2 times daily 11/11/15   Felicie Morn, NP  lisinopril (PRINIVIL,ZESTRIL) 20 MG tablet Take 1 tablet (20 mg total) by mouth daily. 06/13/18   Harolyn Cocker, Tinnie Gens, PA-C  meloxicam (MOBIC) 7.5 MG tablet Take 1 tablet (7.5 mg total) by mouth 2 (two) times daily after a meal. 07/06/16   Kindl, Quita Skye, MD  Multiple Vitamin (MULTIVITAMIN) tablet Take 1 tablet by mouth daily.    [provider]  ondansetron (ZOFRAN ODT) 4 MG disintegrating tablet Take 1 tablet (4 mg total) by mouth every 4 (four) hours as needed for nausea or vomiting. 04/29/15   Carrolyn Leigh, PA-C  ondansetron (ZOFRAN) 4 MG tablet Take 1 tablet (4 mg total) by mouth every 6 (six) hours. 05/23/18   Eustace Moore, MD  polyethylene glycol powder (GLYCOLAX/MIRALAX) powder Take 17 g  by mouth daily. 05/27/15   Minna AntisPaduchowski, Kevin, MD  ranitidine (ZANTAC) 150 MG capsule Take 1 capsule (150 mg total) by mouth daily. 02/07/16   Urban GibsonBeatty, Jenny, MD    Family History Family History  Problem Relation Age of Onset  . Cancer Mother        breast  . Heart disease Brother   . Hyperlipidemia Brother   . Hypertension Brother     Social History Social History   Tobacco Use  . Smoking status: Never Smoker  . Smokeless tobacco: Never Used  Substance Use Topics  . Alcohol use: Yes    Comment: occ  . Drug use: No     Allergies   Patient has no known allergies.   Review of Systems Review of Systems  All other systems reviewed and are negative.   Physical Exam Updated Vital Signs BP 138/75   Pulse 85   Temp 98.5 F (36.9 C) (Oral)   Resp 18   LMP 09/03/2012   SpO2 100%   Physical  Exam Vitals signs and nursing note reviewed.  Constitutional:      Appearance: She is well-developed.  HENT:     Head: Normocephalic and atraumatic.  Eyes:     General: No scleral icterus.       Right eye: No discharge.        Left eye: No discharge.     Conjunctiva/sclera: Conjunctivae normal.     Pupils: Pupils are equal, round, and reactive to light.  Neck:     Musculoskeletal: Normal range of motion.     Vascular: No JVD.     Trachea: No tracheal deviation.  Pulmonary:     Effort: Pulmonary effort is normal.     Breath sounds: No stridor.  Musculoskeletal:     Comments: Small puncture wound noted at the head of the second metatarsal left foot, no surrounding redness  Neurological:     Mental Status: She is alert and oriented to person, place, and time.     Coordination: Coordination normal.  Psychiatric:        Behavior: Behavior normal.        Thought Content: Thought content normal.        Judgment: Judgment normal.     ED Treatments / Results  Labs (all labs ordered are listed, but only abnormal results are displayed) Labs Reviewed - No data to display  EKG None  Radiology Dg Foot Complete Left  Addendum Date: 06/13/2018   ADDENDUM REPORT: 06/13/2018 10:54 ADDENDUM: There is a nonmetallic 6 x 2 mm radiopaque structure volar to the proximal aspect of the second proximal phalanx on the frontal view. It is difficult to ascertain whether this structure is within the soft tissues or along a skin fold in this area. This structure on the oblique view appears to have moved significantly lateral compared to its position on the frontal view which suggests that it may actually reside within a skin fold. Clinical assessment in this regard advised. Electronically Signed   By: Bretta BangWilliam  Woodruff III M.D.   On: 06/13/2018 10:54   Result Date: 06/13/2018 CLINICAL DATA:  Recent puncture wound by nail EXAM: LEFT FOOT - COMPLETE 3+ VIEW COMPARISON:  None. FINDINGS: Frontal, oblique,  and lateral views obtained. No fracture or dislocation. No radiopaque foreign body or soft tissue air evident. There is mild generalized soft tissue swelling. There is no appreciable joint space narrowing or erosion. No bony destruction. There is a prominent posterior calcaneal spur with  calcification in the distal Achilles tendon. IMPRESSION: Soft tissue swelling. No soft tissue air or radiopaque foreign body. No fracture or dislocation. No appreciable arthropathy. No bony destruction. Prominent posterior calcaneal spur with calcification in the distal Achilles tendon. Electronically Signed: By: Bretta BangWilliam  Woodruff III M.D. On: 06/13/2018 09:48    Procedures .Foreign Body Removal Date/Time: 06/13/2018 12:03 PM Performed by: Eyvonne MechanicHedges, Meryle Pugmire, PA-C Authorized by: Eyvonne MechanicHedges, Kearra Calkin, PA-C  Consent: Verbal consent obtained. Risks and benefits: risks, benefits and alternatives were discussed Consent given by: patient Patient understanding: patient states understanding of the procedure being performed Patient consent: the patient's understanding of the procedure matches consent given Procedure consent: procedure consent matches procedure scheduled Relevant documents: relevant documents present and verified Imaging studies: imaging studies available Required items: required blood products, implants, devices, and special equipment available Patient identity confirmed: verbally with patient Intake: Left foot. Anesthesia: local infiltration  Anesthesia: Local Anesthetic: lidocaine 1% without epinephrine Anesthetic total: 1 mL  Sedation: Patient sedated: no  Patient restrained: no Complexity: simple 1 objects recovered. Objects recovered: 6 mm piece of glass Post-procedure assessment: foreign body removed Patient tolerance: Patient tolerated the procedure well with no immediate complications   (including critical care time)  Medications Ordered in ED Medications  Tdap (BOOSTRIX) injection 0.5  mL (0.5 mLs Intramuscular Given 06/13/18 1031)  lidocaine (XYLOCAINE) 2 % (with pres) injection 400 mg (400 mg Infiltration Given 06/13/18 1037)     Initial Impression / Assessment and Plan / ED Course  I have reviewed the triage vital signs and the nursing notes.  Pertinent labs & imaging results that were available during my care of the patient were reviewed by me and considered in my medical decision making (see chart for details).     Assessment/Plan: 52 year old female presents today with a piece of glass in her foot.  This was removed without complication.  Patient has no history of diabetes, previous history of borderline.  I discussed antibiotics versus watching and waiting.  Patient like to proceed out antibiotics I find this completely reasonable.  She will return if she develops any signs of infection, strict return precautions given.  Verbalized understanding and agreement to today's plan.  Patient also notes a history of hypertension but has run out of her medication.  She notes she has not taken it in 1 month, her blood pressure is only mildly elevated here.  She notes that she is on HCTZ and lisinopril.  Chart review shows lisinopril prescription.  Should be given lisinopril encouraged follow-up with her primary care.     Final Clinical Impressions(s) / ED Diagnoses   Final diagnoses:  Hypertension, unspecified type  Foreign body in left foot, initial encounter    ED Discharge Orders         Ordered    lisinopril (PRINIVIL,ZESTRIL) 20 MG tablet  Daily     06/13/18 1211           Eyvonne MechanicHedges, Nai Borromeo, PA-C 06/13/18 1212    Tegeler, Canary Brimhristopher J, MD 06/13/18 1550

## 2018-06-13 NOTE — Discharge Instructions (Addendum)
Please read attached information. If you experience any new or worsening signs or symptoms please return to the emergency room for evaluation. Please follow-up with your primary care provider or specialist as discussed. Please use medication prescribed only as directed and discontinue taking if you have any concerning signs or symptoms.   °

## 2018-06-13 NOTE — ED Triage Notes (Signed)
Pt in stating she stepped on a nail yesterday with her left foot, c/o increased pain today, also headache, no distress noted

## 2018-07-11 ENCOUNTER — Ambulatory Visit: Payer: Self-pay | Admitting: Family Medicine

## 2018-08-11 ENCOUNTER — Ambulatory Visit: Payer: Self-pay | Admitting: Family Medicine

## 2018-08-17 ENCOUNTER — Encounter (HOSPITAL_COMMUNITY): Payer: Self-pay

## 2018-08-17 ENCOUNTER — Other Ambulatory Visit: Payer: Self-pay

## 2018-08-17 ENCOUNTER — Emergency Department (HOSPITAL_COMMUNITY)
Admission: EM | Admit: 2018-08-17 | Discharge: 2018-08-17 | Disposition: A | Discharge status: Home / Self Care | Payer: BLUE CROSS/BLUE SHIELD | Attending: Emergency Medicine | Admitting: Emergency Medicine

## 2018-08-17 DIAGNOSIS — Y999 Unspecified external cause status: Secondary | ICD-10-CM | POA: Insufficient documentation

## 2018-08-17 DIAGNOSIS — Y939 Activity, unspecified: Secondary | ICD-10-CM | POA: Diagnosis not present

## 2018-08-17 DIAGNOSIS — Y929 Unspecified place or not applicable: Secondary | ICD-10-CM | POA: Insufficient documentation

## 2018-08-17 DIAGNOSIS — E119 Type 2 diabetes mellitus without complications: Secondary | ICD-10-CM | POA: Insufficient documentation

## 2018-08-17 DIAGNOSIS — X501XXA Overexertion from prolonged static or awkward postures, initial encounter: Secondary | ICD-10-CM | POA: Insufficient documentation

## 2018-08-17 DIAGNOSIS — I10 Essential (primary) hypertension: Secondary | ICD-10-CM | POA: Diagnosis not present

## 2018-08-17 DIAGNOSIS — Z79899 Other long term (current) drug therapy: Secondary | ICD-10-CM | POA: Diagnosis not present

## 2018-08-17 DIAGNOSIS — S39012A Strain of muscle, fascia and tendon of lower back, initial encounter: Secondary | ICD-10-CM

## 2018-08-17 LAB — URINALYSIS, ROUTINE W REFLEX MICROSCOPIC
Bilirubin Urine: NEGATIVE
GLUCOSE, UA: NEGATIVE mg/dL
HGB URINE DIPSTICK: NEGATIVE
Ketones, ur: NEGATIVE mg/dL
LEUKOCYTE UA: NEGATIVE
Nitrite: NEGATIVE
PH: 6 (ref 5.0–8.0)
Protein, ur: NEGATIVE mg/dL
Specific Gravity, Urine: 1.025 (ref 1.005–1.030)

## 2018-08-17 MED ORDER — IBUPROFEN 600 MG PO TABS: 600.0000 mg | ORAL_TABLET | Freq: Four times a day (QID) | ORAL | 0 refills | Status: DC | PRN

## 2018-08-17 MED ORDER — CYCLOBENZAPRINE HCL 10 MG PO TABS: 10.0000 mg | ORAL_TABLET | Freq: Two times a day (BID) | ORAL | 0 refills | Status: DC | PRN

## 2018-08-17 MED ORDER — ACETAMINOPHEN 500 MG PO TABS
1000.0000 mg | ORAL_TABLET | Freq: Once | ORAL | Status: AC
  Administered 2018-08-17: 1000 mg via ORAL
  Filled 2018-08-17: qty 2

## 2018-08-17 MED ORDER — IBUPROFEN 800 MG PO TABS
800.0000 mg | ORAL_TABLET | Freq: Once | ORAL | Status: AC
Start: 2018-08-17 — End: 2018-08-17
  Administered 2018-08-17: 800 mg via ORAL
  Filled 2018-08-17: qty 1

## 2018-08-17 NOTE — ED Provider Notes (Signed)
[facility] EMERGENCY DEPARTMENT Provider Note   CSN: [id number] Arrival date & time: 08/17/18  1650    History   Chief Complaint Chief Complaint  Patient presents with  . Flank Pain  . Back Pain    HPI Natasha Doyle is a 53 y.o. female.     The history is provided by the patient and medical records. No language interpreter was used.   Natasha Doyle is a 53 y.o. female who presents to the Emergency Department complaining of back pain. She reports bilateral mid back pain that began last night.  Pain is waxing and waning with no clear alleviating or worsening factors. No known injuries. Pain is located along the upper lumbar region radiates to bilateral sides of her back. Pain is worse with bending and twisting. She denies any fevers, chills, shortness of breath, nausea, vomiting, abdominal pain, dysuria, hematuria, numbness, weakness. He does have a history of kidney stones in the 90s. She also has a history of high blood pressure but is not currently on medications. She took ibuprofen yesterday as well as Tylenol today with no significant change in her symptoms. She works as a Lawyer. Past Medical History:  Diagnosis Date  . Diabetes mellitus without complication (HCC)    borderline  . Hypertension   . Obesity     Patient Active Problem List   Diagnosis Date Noted  . Morbid obesity (HCC) 04/29/2015  . High blood pressure 09/28/2013  . Obesities, morbid (HCC) 09/28/2013    Past Surgical History:  Procedure Laterality Date  . BREATH TEK H PYLORI N/A 10/22/2014   Procedure: BREATH TEK H PYLORI;  Surgeon: Gaynelle Adu, MD;  Location: [facility];  Service: General;  Laterality: N/A;  . CHOLECYSTECTOMY    . HERNIA REPAIR    . LAPAROSCOPIC GASTRIC SLEEVE RESECTION N/A 04/29/2015   Procedure: LAPAROSCOPIC GASTRIC SLEEVE RESECTION;  Surgeon: Geoffry Paradise, MD;  Location: [facility] ORS;  Service: General;  Laterality: N/A;     OB History    Gravida  2   Para  2     Term  2   Preterm  0   AB  0   Living  2     SAB  0   TAB  0   Ectopic  0   Multiple  0   Live Births  2            Home Medications    Prior to Admission medications   Medication Sig Start Date End Date Taking? Authorizing Provider  acetaminophen (TYLENOL) 325 MG tablet Take 2 tablets (650 mg total) by mouth every 6 (six) hours as needed for mild pain or moderate pain. 05/16/16  Yes Everlene Farrier, PA-C  Multiple Vitamins-Calcium (ONE-A-DAY WOMENS PO) Take 1 tablet by mouth daily.   Yes [provider]  cyclobenzaprine (FLEXERIL) 10 MG tablet Take 1 tablet (10 mg total) by mouth 2 (two) times daily as needed for muscle spasms. 08/17/18   Tilden Fossa, MD  Diclofenac Sodium 3 % GEL Place 1 application onto the skin 2 (two) times daily. To affected area. Patient not taking: Reported on 08/17/2018 05/16/16   Everlene Farrier, PA-C  enoxaparin (LOVENOX) 40 MG/0.4ML injection Inject 0.4 mLs (40 mg total) into the skin daily. Patient not taking: Reported on 08/17/2018 04/29/15   Carrolyn Leigh, PA-C  hydrocortisone cream 1 % Apply to affected area 2 times daily Patient not taking: Reported on 08/17/2018 11/11/15   Felicie Morn,  NP  ibuprofen (ADVIL,MOTRIN) 600 MG tablet Take 1 tablet (600 mg total) by mouth every 6 (six) hours as needed. 08/17/18   Tilden Fossa, MD  lisinopril (PRINIVIL,ZESTRIL) 20 MG tablet Take 1 tablet (20 mg total) by mouth daily. Patient not taking: Reported on 08/17/2018 06/13/18   Hedges, Tinnie Gens, PA-C  meloxicam (MOBIC) 7.5 MG tablet Take 1 tablet (7.5 mg total) by mouth 2 (two) times daily after a meal. Patient not taking: Reported on 08/17/2018 07/06/16   Linna Hoff, MD  ondansetron (ZOFRAN ODT) 4 MG disintegrating tablet Take 1 tablet (4 mg total) by mouth every 4 (four) hours as needed for nausea or vomiting. Patient not taking: Reported on 08/17/2018 04/29/15   Carrolyn Leigh, PA-C  ondansetron (ZOFRAN) 4 MG tablet Take 1 tablet (4 mg total) by  mouth every 6 (six) hours. Patient not taking: Reported on 08/17/2018 05/23/18   Eustace Moore, MD  polyethylene glycol powder [facility]) powder Take 17 g by mouth daily. Patient not taking: Reported on 08/17/2018 05/27/15   Minna Antis, MD  ranitidine (ZANTAC) 150 MG capsule Take 1 capsule (150 mg total) by mouth daily. Patient not taking: Reported on 08/17/2018 02/07/16   Urban Gibson, MD    Family History Family History  Problem Relation Age of Onset  . Cancer Mother        breast  . Heart disease Brother   . Hyperlipidemia Brother   . Hypertension Brother     Social History Social History   Tobacco Use  . Smoking status: Never Smoker  . Smokeless tobacco: Never Used  Substance Use Topics  . Alcohol use: Yes    Comment: occ  . Drug use: No     Allergies   Patient has no known allergies.   Review of Systems Review of Systems  All other systems reviewed and are negative.    Physical Exam Updated Vital Signs BP (!) 168/87   Pulse 71   Temp 98.9 F (37.2 C) (Oral)   Resp 16   Ht  (1.6 m)   Wt 84.4 kg   LMP 09/03/2012   SpO2 100%   BMI 32.95 kg/m   Physical Exam Vitals signs and nursing note reviewed.  Constitutional:      Appearance: She is well-developed.  HENT:     Head: Normocephalic and atraumatic.  Cardiovascular:     Rate and Rhythm: Normal rate and regular rhythm.     Heart sounds: No murmur.  Pulmonary:     Effort: Pulmonary effort is normal. No respiratory distress.     Breath sounds: Normal breath sounds.  Abdominal:     Palpations: Abdomen is soft.     Tenderness: There is no abdominal tenderness. There is no guarding or rebound.  Musculoskeletal:        General: No tenderness.     Comments: No CVA or midline back tenderness. 2+ DP pulses bilaterally.  Skin:    General: Skin is warm and dry.  Neurological:     Mental Status: She is alert and oriented to person, place, and time.     Comments: Five out of five  strength in all four extremities with sensation to light touch intact in all four extremities  Psychiatric:        Behavior: Behavior normal.      ED Treatments / Results  Labs (all labs ordered are listed, but only abnormal results are displayed) Labs Reviewed  URINALYSIS, ROUTINE W REFLEX MICROSCOPIC  EKG None  Radiology No results found.  Procedures Ultrasound ED Abd Date/Time: 08/17/2018 5:57 PM Performed by: Tilden Fossa, MD Authorized by: Tilden Fossa, MD   Procedure details:    Indications: back pain     Assessment for:  AAA   Aorta:  Visualized   Images: archived   Study Limitations: bowel gas Vascular findings:    Aorta: aorta normal (< 3cm)     Intra-abdominal fluid: unidentified     (including critical care time)  Medications Ordered in ED Medications  ibuprofen (ADVIL,MOTRIN) tablet 800 mg (800 mg Oral Given 08/17/18 1750)  acetaminophen (TYLENOL) tablet 1,000 mg (1,000 mg Oral Given 08/17/18 1750)     Initial Impression / Assessment and Plan / ED Course  I have reviewed the triage vital signs and the nursing notes.  Pertinent labs & imaging results that were available during my care of the patient were reviewed by me and considered in my medical decision making (see chart for details).       Patient here for evaluation of atraumatic back pain. She is well appearing on examination, NVI. Presentation is not consistent with aortic aneurysm, aortic dissection, renal colic, epidural abscess. Discussed with patient home care for lumbar strain. Discussed outpatient follow-up and return precautions. Final Clinical Impressions(s) / ED Diagnoses   Final diagnoses:  Strain of lumbar region, initial encounter    ED Discharge Orders         Ordered    ibuprofen (ADVIL,MOTRIN) 600 MG tablet  Every 6 hours PRN     08/17/18 1755    cyclobenzaprine (FLEXERIL) 10 MG tablet  2 times daily PRN     08/17/18 1755           Tilden Fossa, MD 08/17/18  1758

## 2018-08-17 NOTE — ED Triage Notes (Addendum)
Pt reports was woken up this am by bilateral flank and back pain. Pt reports hx of kidney stones years ago. Pt denies any urinary s/s. Pt denies injury. Pt reports taking tylenol at home without relief.

## 2018-08-17 NOTE — ED Notes (Signed)
ED Provider at bedside. 

## 2019-06-06 ENCOUNTER — Other Ambulatory Visit: Payer: Self-pay

## 2019-06-06 ENCOUNTER — Encounter (HOSPITAL_COMMUNITY): Payer: Self-pay

## 2019-06-06 ENCOUNTER — Ambulatory Visit (HOSPITAL_COMMUNITY)
Admission: EM | Admit: 2019-06-06 | Discharge: 2019-06-06 | Disposition: A | Discharge status: Home / Self Care | Payer: BLUE CROSS/BLUE SHIELD | Attending: Family Medicine | Admitting: Family Medicine

## 2019-06-06 DIAGNOSIS — U071 COVID-19: Secondary | ICD-10-CM

## 2019-06-06 LAB — POC SARS CORONAVIRUS 2 AG -  ED: SARS Coronavirus 2 Ag: POSITIVE — AB

## 2019-06-06 LAB — POC SARS CORONAVIRUS 2 AG: SARS Coronavirus 2 Ag: POSITIVE — AB

## 2019-06-06 NOTE — ED Triage Notes (Signed)
Patient presents to Urgent Care with complaints of fever and chills since last night. Patient reports she took tylenol last night for the fever and it reduced to 99.5 from 100.4.

## 2019-06-06 NOTE — ED Provider Notes (Signed)
[facility]   [id number] 06/06/19 Arrival Time: 5732  ASSESSMENT & PLAN:  1. COVID-19 virus infection      Work note with quarantine restrictions given. OTC symptom care as needed.  Follow-up Information    [place].   Specialty: Urgent Care Why: As needed. Contact information: [place] [phone]          Reviewed expectations re: course of current medical issues. Questions answered. Outlined signs and symptoms indicating need for more acute intervention. Patient verbalized understanding. After Visit Summary given.   SUBJECTIVE: History from: patient. Natasha Doyle is a 53 y.o. female who reports abrupt onset of subjective fever and chills first noted last evening; measured after Tylenol 99.5 and 100.4 degrees F. Known COVID-19 contact: questions co-workers. Recent travel: none. Denies: runny nose, congestion, fever, cough, sore throat, difficulty breathing and headache. Normal PO intake without n/v/d.  ROS: As per HPI.   OBJECTIVE:  Vitals:   06/06/19 1142  BP: (!) 161/84  Pulse: 78  Resp: 16  Temp: 99.9 F (37.7 C)  TempSrc: Oral  SpO2: 100%    General appearance: alert; no distress Eyes: PERRLA; EOMI; conjunctiva normal HENT: [place]; AT; nasal mucosa normal; oral mucosa normal Neck: supple  Lungs: speaks full sentences without difficulty; unlabored Heart: regular rate and rhythm Abdomen: soft, non-tender Extremities: no edema Skin: warm and dry Neurologic: normal gait Psychological: alert and cooperative; normal mood and affect  Labs:  Labs Reviewed  POC SARS CORONAVIRUS 2 AG -  ED - Abnormal; Notable for the following components:      Result Value   SARS Coronavirus 2 Ag POSITIVE (*)    All other components within normal limits  POC SARS CORONAVIRUS 2 AG - Abnormal; Notable for the following components:   SARS Coronavirus 2 Ag POSITIVE (*)    All  other components within normal limits     No Known Allergies  Past Medical History:  Diagnosis Date  . Diabetes mellitus without complication (HCC)    borderline  . Hypertension   . Obesity    Social History   Socioeconomic History  . Marital status: Single    Spouse name: Not on file  . Number of children: Not on file  . Years of education: Not on file  . Highest education level: Not on file  Occupational History  . Not on file  Tobacco Use  . Smoking status: Never Smoker  . Smokeless tobacco: Never Used  Substance and Sexual Activity  . Alcohol use: Yes    Comment: occ  . Drug use: No  . Sexual activity: Not on file  Other Topics Concern  . Not on file  Social History Narrative  . Not on file   Social Determinants of Health   Financial Resource Strain:   . Difficulty of Paying Living Expenses: Not on file  Food Insecurity:   . Worried About Charity fundraiser in the Last Year: Not on file  . Ran Out of Food in the Last Year: Not on file  Transportation Needs:   . Lack of Transportation (Medical): Not on file  . Lack of Transportation (Non-Medical): Not on file  Physical Activity:   . Days of Exercise per Week: Not on file  . Minutes of Exercise per Session: Not on file  Stress:   . Feeling of Stress : Not on file  Social Connections:   . Frequency of Communication  with Friends and Family: Not on file  . Frequency of Social Gatherings with Friends and Family: Not on file  . Attends Religious Services: Not on file  . Active Member of Clubs or Organizations: Not on file  . Attends Banker Meetings: Not on file  . Marital Status: Not on file  Intimate Partner Violence:   . Fear of Current or Ex-Partner: Not on file  . Emotionally Abused: Not on file  . Physically Abused: Not on file  . Sexually Abused: Not on file   Family History  Problem Relation Age of Onset  . Cancer Mother        breast  . Hypertension Mother   . Hyperlipidemia  Mother   . Healthy Father   . Heart disease Brother   . Hyperlipidemia Brother   . Hypertension Brother    Past Surgical History:  Procedure Laterality Date  . BREATH TEK H PYLORI N/A 10/22/2014   Procedure: BREATH TEK H PYLORI;  Surgeon: Gaynelle Adu, MD;  Location: [facility];  Service: General;  Laterality: N/A;  . CHOLECYSTECTOMY    . HERNIA REPAIR    . LAPAROSCOPIC GASTRIC SLEEVE RESECTION N/A 04/29/2015   Procedure: LAPAROSCOPIC GASTRIC SLEEVE RESECTION;  Surgeon: Geoffry Paradise, MD;  Location: [facility] ORS;  Service: General;  Laterality: Vertis Kelch, MD 06/06/19 1401

## 2019-06-07 ENCOUNTER — Telehealth: Payer: Self-pay | Admitting: Infectious Diseases

## 2019-06-07 DIAGNOSIS — U071 COVID-19: Secondary | ICD-10-CM

## 2019-06-07 NOTE — Telephone Encounter (Signed)
She is still having some fevers (took tylenol when it hit 99.5)   She had bariatric surgery that corrected her metabolic problems so no longer needing DM or HTN medications. BMI now 32.9.   Her husband, daughter and son live with her - daughter with fever and husband (who has no medical problems he is treated for) had a day of diarrhea.  Everyone sick is wearing a mask and trying to isolate others away from them as much as possible.   Isolation precautions and symptomatic care discussed. She is not a candidate for monoclonal antibody infusion.

## 2019-08-19 ENCOUNTER — Encounter (HOSPITAL_COMMUNITY): Payer: Self-pay

## 2019-08-19 ENCOUNTER — Other Ambulatory Visit: Payer: Self-pay

## 2019-08-19 ENCOUNTER — Ambulatory Visit (HOSPITAL_COMMUNITY)
Admission: EM | Admit: 2019-08-19 | Discharge: 2019-08-19 | Disposition: A | Discharge status: Home / Self Care | Payer: 59 | Attending: Family Medicine | Admitting: Family Medicine

## 2019-08-19 DIAGNOSIS — G43811 Other migraine, intractable, with status migrainosus: Secondary | ICD-10-CM

## 2019-08-19 DIAGNOSIS — Z8616 Personal history of COVID-19: Secondary | ICD-10-CM

## 2019-08-19 MED ORDER — ONDANSETRON 4 MG PO TBDP
4.0000 mg | ORAL_TABLET | Freq: Once | ORAL | Status: AC
  Administered 2019-08-19: 4 mg via ORAL

## 2019-08-19 MED ORDER — IBUPROFEN 800 MG PO TABS: 800.0000 mg | ORAL_TABLET | Freq: Three times a day (TID) | ORAL | 0 refills | Status: DC | PRN

## 2019-08-19 MED ORDER — KETOROLAC TROMETHAMINE 60 MG/2ML IM SOLN
INTRAMUSCULAR | Status: AC
  Filled 2019-08-19: qty 2

## 2019-08-19 MED ORDER — CEPHALEXIN 500 MG PO CAPS: 500.0000 mg | ORAL_CAPSULE | Freq: Four times a day (QID) | ORAL | 0 refills | Status: DC

## 2019-08-19 MED ORDER — ONDANSETRON 4 MG PO TBDP
ORAL_TABLET | ORAL | Status: AC
  Filled 2019-08-19: qty 1

## 2019-08-19 MED ORDER — KETOROLAC TROMETHAMINE 60 MG/2ML IM SOLN
60.0000 mg | Freq: Once | INTRAMUSCULAR | Status: AC
  Administered 2019-08-19: 60 mg via INTRAMUSCULAR

## 2019-08-19 NOTE — ED Triage Notes (Signed)
Pt c/o HA, chills, general malaise x2 days. Nausea/vomiting onset last night. Denies cough, sore throat, abd pain or diarrhea. Was COVID positive 06/06/19  Pt states she took 1000mg  tylenol and 800mg  ibuprofen at 0730 a.m. did not measure temperature before taking medications.

## 2019-08-19 NOTE — Discharge Instructions (Addendum)
I have sent in Zofran for you for nausea.   Try Excedrin migraine.  You have received Toradol 60mg  shot for pain today. Zofran in office today as well.   Follow up if symptoms are not improving follow up with primary care or this office.

## 2019-08-19 NOTE — ED Provider Notes (Signed)
[facility]    CSN: [id number] Arrival date & time: 08/19/19  1043      History   Chief Complaint Chief Complaint  Patient presents with  . Headache  . Nausea  . Emesis    HPI Natasha Doyle is a 54 y.o. female.   Reports headache for the last 3 days.  Reports nausea and one episode of vomiting as well.  Denies fever, cough, sore throat, abdominal pain, diarrhea, rash, body aches, other symptoms.  Reports that she had Covid in December.  Has been taking Tylenol and ibuprofen with no relief for her headache.  ROS per HPI  The history is provided by the patient.    Past Medical History:  Diagnosis Date  . Diabetes mellitus without complication (HCC)    borderline  . Hypertension   . Obesity     Patient Active Problem List   Diagnosis Date Noted  . 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 BREATH TEK H PYLORI N/A 10/22/2014   Procedure: BREATH TEK H PYLORI;  Surgeon: Gaynelle Adu, MD;  Location: [facility];  Service: General;  Laterality: N/A;  . CHOLECYSTECTOMY    . HERNIA REPAIR    . LAPAROSCOPIC GASTRIC SLEEVE RESECTION N/A 04/29/2015   Procedure: LAPAROSCOPIC GASTRIC SLEEVE RESECTION;  Surgeon: Geoffry Paradise, MD;  Location: [facility];  Service: General;  Laterality: N/A;    OB History    Gravida  2   Para  2   Term  2   Preterm  0   AB  0   Living  2     SAB  0   TAB  0   Ectopic  0   Multiple  0   Live Births  2            Home Medications    Prior to Admission medications   Medication Sig Start Date End Date Taking? Authorizing Provider  acetaminophen (TYLENOL) 325 MG tablet Take 2 tablets (650 mg total) by mouth every 6 (six) hours as needed for mild pain or moderate pain. 05/16/16  Yes Everlene Farrier, PA-C  lisinopril (PRINIVIL,ZESTRIL) 20 MG tablet Take 1 tablet (20 mg total) by mouth daily. 06/13/18  Yes Hedges, Tinnie Gens, PA-C  cyclobenzaprine (FLEXERIL) 10 MG  tablet Take 1 tablet (10 mg total) by mouth 2 (two) times daily as needed for muscle spasms. 08/17/18   Tilden Fossa, MD  Diclofenac Sodium 3 % GEL Place 1 application onto the skin 2 (two) times daily. To affected area. Patient not taking: Reported on 08/17/2018 05/16/16   Everlene Farrier, PA-C  enoxaparin (LOVENOX) 40 MG/0.4ML injection Inject 0.4 mLs (40 mg total) into the skin daily. Patient not taking: Reported on 08/17/2018 04/29/15   Carrolyn Leigh, PA-C  hydrocortisone cream 1 % Apply to affected area 2 times daily Patient not taking: Reported on 08/17/2018 11/11/15   Felicie Morn, NP  ibuprofen (ADVIL) 800 MG tablet Take 1 tablet (800 mg total) by mouth every 8 (eight) hours as needed for moderate pain. 08/19/19   Moshe Cipro, NP  meloxicam (MOBIC) 7.5 MG tablet Take 1 tablet (7.5 mg total) by mouth 2 (two) times daily after a meal. Patient not taking: Reported on 08/17/2018 07/06/16   Linna Hoff, MD  Multiple Vitamins-Calcium (ONE-A-DAY WOMENS PO) Take 1 tablet by mouth daily.    [provider]  ondansetron (ZOFRAN ODT) 4 MG disintegrating tablet Take 1  tablet (4 mg total) by mouth every 4 (four) hours as needed for nausea or vomiting. Patient not taking: Reported on 08/17/2018 04/29/15   Mardelle Matte, PA-C  ondansetron (ZOFRAN) 4 MG tablet Take 1 tablet (4 mg total) by mouth every 6 (six) hours. Patient not taking: Reported on 08/17/2018 05/23/18   Raylene Everts, MD  polyethylene glycol powder [facility]) powder Take 17 g by mouth daily. Patient not taking: Reported on 08/17/2018 05/27/15   Harvest Dark, MD  ranitidine (ZANTAC) 150 MG capsule Take 1 capsule (150 mg total) by mouth daily. Patient not taking: Reported on 08/17/2018 02/07/16   Ivin Booty, MD    Family History Family History  Problem Relation Age of Onset  . Cancer Mother        breast  . Hypertension Mother   . Hyperlipidemia Mother   . Healthy Father   . Heart disease Brother   .  Hyperlipidemia Brother   . Hypertension Brother     Social History Social History   Tobacco Use  . Smoking status: Never Smoker  . Smokeless tobacco: Never Used  Substance Use Topics  . Alcohol use: Yes    Comment: occ  . Drug use: No     Allergies   Patient has no known allergies.   Review of Systems Review of Systems   Physical Exam Triage Vital Signs ED Triage Vitals  Enc Vitals Group     BP 08/19/19 1108 (!) 153/84     Pulse Rate 08/19/19 1108 67     Resp 08/19/19 1108 18     Temp 08/19/19 1108 98.6 F (37 C)     Temp src --      SpO2 08/19/19 1108 100 %     Weight --      Height --      Head Circumference --      Peak Flow --      Pain Score 08/19/19 1105 7     Pain Loc --      Pain Edu? --      Excl. in [place]? --    No data found.  Updated Vital Signs BP (!) 153/84 (BP Location: Left Arm)   Pulse 67   Temp 98.6 F (37 C)   Resp 18   LMP 09/03/2012   SpO2 100%      Physical Exam Vitals and nursing note reviewed.  Constitutional:      General: She is not in acute distress.    Appearance: She is well-developed. She is ill-appearing.  HENT:     Head: Normocephalic and atraumatic.     Mouth/Throat:     Mouth: Mucous membranes are moist.  Eyes:     General: No visual field deficit.    Extraocular Movements: Extraocular movements intact.     Conjunctiva/sclera: Conjunctivae normal.     Pupils: Pupils are equal, round, and reactive to light.  Cardiovascular:     Rate and Rhythm: Normal rate and regular rhythm.     Heart sounds: Normal heart sounds. No murmur.  Pulmonary:     Effort: Pulmonary effort is normal. No respiratory distress.     Breath sounds: Normal breath sounds. No stridor. No wheezing, rhonchi or rales.  Chest:     Chest wall: No tenderness.  Abdominal:     Palpations: Abdomen is soft.     Tenderness: There is no abdominal tenderness.  Musculoskeletal:        General: Normal range of motion.  Cervical back: Normal range  of motion and neck supple.  Skin:    General: Skin is warm and dry.     Capillary Refill: Capillary refill takes less than 2 seconds.  Neurological:     Mental Status: She is alert and oriented to person, place, and time.     Cranial Nerves: No cranial nerve deficit, dysarthria or facial asymmetry.     Sensory: No sensory deficit.     Motor: No weakness.     Coordination: Romberg sign negative. Coordination normal.     Gait: Gait normal.  Psychiatric:        Mood and Affect: Mood normal.        Behavior: Behavior normal.      UC Treatments / Results  Labs (all labs ordered are listed, but only abnormal results are displayed) Labs Reviewed - No data to display  EKG   Radiology No results found.  Procedures Procedures (including critical care time)  Medications Ordered in UC Medications  ketorolac (TORADOL) injection 60 mg (60 mg Intramuscular Given 08/19/19 1136)  ondansetron (ZOFRAN-ODT) disintegrating tablet 4 mg (4 mg Oral Given 08/19/19 1134)    Initial Impression / Assessment and Plan / UC Course  I have reviewed the triage vital signs and the nursing notes.  Pertinent labs & imaging results that were available during my care of the patient were reviewed by me and considered in my medical decision making (see chart for details).     Likely intractable migraine.  Has taken ibuprofen and Tylenol with no relief.  Given 60 mg Toradol IM in office today, Zofran in office today.  Zofran 4 mg sublingual called into her pharmacy of choice.  Instructed to follow-up with primary care if symptoms persist, or this office.  Instructed on when to go to the ER. Final Clinical Impressions(s) / UC Diagnoses   Final diagnoses:  Other migraine with status migrainosus, intractable     Discharge Instructions     I have sent in Zofran for you for nausea.   Try Excedrin migraine.  You have received Toradol 60mg  shot for pain today. Zofran in office today as well.   Follow up if  symptoms are not improving follow up with primary care or this office.       ED Prescriptions    Medication Sig Dispense Auth. Provider   cephALEXin (KEFLEX) 500 MG capsule  (Status: Discontinued) Take 1 capsule (500 mg total) by mouth 4 (four) times daily. 20 capsule , NP   ibuprofen (ADVIL) 800 MG tablet Take 1 tablet (800 mg total) by mouth every 8 (eight) hours as needed for moderate pain. 21 tablet Moshe Cipro, NP     PDMP not reviewed this encounter.   Moshe Cipro, NP 08/19/19 1208

## 2019-09-25 DIAGNOSIS — Z903 Acquired absence of stomach [part of]: Secondary | ICD-10-CM | POA: Insufficient documentation

## 2019-09-25 DIAGNOSIS — K219 Gastro-esophageal reflux disease without esophagitis: Secondary | ICD-10-CM | POA: Insufficient documentation

## 2019-10-29 ENCOUNTER — Other Ambulatory Visit: Payer: Self-pay

## 2019-10-30 ENCOUNTER — Ambulatory Visit (INDEPENDENT_AMBULATORY_CARE_PROVIDER_SITE_OTHER): Payer: 59 | Admitting: Physician Assistant

## 2019-10-30 ENCOUNTER — Encounter: Payer: Self-pay | Admitting: Physician Assistant

## 2019-10-30 VITALS — BP 132/84 | HR 72 | Temp 97.6°F | Ht 64.5 in | Wt 234.2 lb

## 2019-10-30 DIAGNOSIS — K2100 Gastro-esophageal reflux disease with esophagitis, without bleeding: Secondary | ICD-10-CM | POA: Diagnosis not present

## 2019-10-30 DIAGNOSIS — Z1231 Encounter for screening mammogram for malignant neoplasm of breast: Secondary | ICD-10-CM

## 2019-10-30 DIAGNOSIS — E8881 Metabolic syndrome: Secondary | ICD-10-CM

## 2019-10-30 DIAGNOSIS — I10 Essential (primary) hypertension: Secondary | ICD-10-CM | POA: Diagnosis not present

## 2019-10-30 DIAGNOSIS — Z114 Encounter for screening for human immunodeficiency virus [HIV]: Secondary | ICD-10-CM

## 2019-10-30 MED ORDER — PANTOPRAZOLE SODIUM 40 MG PO TBEC: 40.0000 mg | DELAYED_RELEASE_TABLET | Freq: Every day | ORAL | 3 refills | Status: DC

## 2019-10-30 MED ORDER — LISINOPRIL-HYDROCHLOROTHIAZIDE 20-25 MG PO TABS: 1.0000 | ORAL_TABLET | Freq: Every day | ORAL | 3 refills | Status: DC

## 2019-10-30 NOTE — Progress Notes (Signed)
Natasha Doyle is a 54 y.o. female is here to discuss:  I acted as a Neurosurgeon for [employer], PA-C Corky Mull, LPN   History of Present Illness:   Chief Complaint  Patient presents with  . Establish Care  . Heartburn    HPI   Heartburn Pt c/o heartburn x 2 yrs, increased in the past 5 months. Pt had gastric sleeve symptoms in 2016, and also hiatal hernia repair at the same time. Pt saw surgeon 10/19/2019 did Endoscopy at [facility] and saw she had return/worsening of her hiatal hernia again which is causing her heartburn. She has only taken oral omeprazole in the past. Heartburn is so bad at times that she is vomiting. She does report weight gain and dietary indiscretion worsening with the pandemic.  Insulin resistance Prior history of DM. Most recent A1c was 6.2%. Blood sugars at home are: not checked. Denies: hypoglycemic or hyperglycemic episodes or symptoms. She is currently not on any medications.  Lab Results  Component Value Date   HGBA1C 6.2 (H) 04/20/2010   HTN Currently taking Lisinopril 20 mg. At home blood pressure readings are: not checked. Patient denies chest pain, SOB, blurred vision, dizziness, unusual headaches. Patient is compliant with medication. Denies excessive caffeine intake, stimulant usage, excessive alcohol intake, or increase in salt consumption.  Has been without her Lisinopril 20 mg x 2.5 weeks. She was on lisinopril-hctz in the past with better relief of her LE edema. Would like to resume this if possible.  BP Readings from Last 3 Encounters:  10/30/19 132/84  08/19/19 (!) 153/84  06/06/19 (!) 161/84    Health Maintenance Due  Topic Date Due  . HIV Screening  Never done  . COVID-19 Vaccine (1) Never done  . COLONOSCOPY  Never done  . MAMMOGRAM  10/02/2015  . PAP SMEAR-Modifier  09/28/2016    Past Medical History:  Diagnosis Date  . Diabetes mellitus without complication (HCC)    borderline  . GERD (gastroesophageal reflux  disease)   . Hypertension   . Obesity   . Vaginal delivery 1989, 1992     Social History   Socioeconomic History  . Marital status: Single    Spouse name: Not on file  . Number of children: Not on file  . Years of education: Not on file  . Highest education level: Not on file  Occupational History  . Not on file  Tobacco Use  . Smoking status: Never Smoker  . Smokeless tobacco: Never Used  Substance and Sexual Activity  . Alcohol use: Not Currently  . Drug use: No  . Sexual activity: Yes    Birth control/protection: Post-menopausal  Other Topics Concern  . Not on file  Social History Narrative   Nursing Assistant   Social Determinants of Health   Financial Resource Strain:   . Difficulty of Paying Living Expenses:   Food Insecurity:   . Worried About Programme researcher, broadcasting/film/video in the Last Year:   . Barista in the Last Year:   Transportation Needs:   . Freight forwarder (Medical):   Marland Kitchen Lack of Transportation (Non-Medical):   Physical Activity:   . Days of Exercise per Week:   . Minutes of Exercise per Session:   Stress:   . Feeling of Stress :   Social Connections:   . Frequency of Communication with Friends and Family:   . Frequency of Social Gatherings with Friends and Family:   . Attends Religious  Services:   . Active Member of Clubs or Organizations:   . Attends Banker Meetings:   Marland Kitchen Marital Status:   Intimate Partner Violence:   . Fear of Current or Ex-Partner:   . Emotionally Abused:   Marland Kitchen Physically Abused:   . Sexually Abused:     Past Surgical History:  Procedure Laterality Date  . BREATH TEK H PYLORI N/A 10/22/2014   Procedure: BREATH TEK H PYLORI;  Surgeon: Gaynelle Adu, MD;  Location: [facility];  Service: General;  Laterality: N/A;  . CHOLECYSTECTOMY    . HERNIA REPAIR    . LAPAROSCOPIC GASTRIC SLEEVE RESECTION N/A 04/29/2015   Procedure: LAPAROSCOPIC GASTRIC SLEEVE RESECTION;  Surgeon: Geoffry Paradise, MD;  Location: [facility];   Service: General;  Laterality: N/A;    Family History  Problem Relation Age of Onset  . Cancer Mother        breast  . Hypertension Mother   . Hyperlipidemia Mother   . Alcohol abuse Father   . Heart disease Brother   . Hyperlipidemia Brother   . Hypertension Brother     PMHx, SurgHx, SocialHx, FamHx, Medications, and Allergies were reviewed in the Visit Navigator and updated as appropriate.   Patient Active Problem List   Diagnosis Date Noted  . 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  . Gastroesophageal reflux disease 09/25/2019  . Status post bariatric surgery 03/30/2017    Social History   Tobacco Use  . Smoking status: Never Smoker  . Smokeless tobacco: Never Used  Substance Use Topics  . Alcohol use: Not Currently  . Drug use: No    Current Medications and Allergies:    Current Outpatient Medications:  .  acetaminophen (TYLENOL) 325 MG tablet, Take 2 tablets (650 mg total) by mouth every 6 (six) hours as needed for mild pain or moderate pain., Disp: 60 tablet, Rfl: 0 .  Cholecalciferol 125 MCG (5000 UT) TABS, Take by mouth., Disp: , Rfl:  .  lisinopril (PRINIVIL,ZESTRIL) 20 MG tablet, Take 1 tablet (20 mg total) by mouth daily., Disp: 30 tablet, Rfl: 0 .  Multiple Vitamins-Calcium (ONE-A-DAY WOMENS PO), Take 1 tablet by mouth daily., Disp: , Rfl:  .  omeprazole (PRILOSEC) 20 MG capsule, Take by mouth., Disp: , Rfl:  .  lisinopril-hydrochlorothiazide (ZESTORETIC) 20-25 MG tablet, Take 1 tablet by mouth daily., Disp: 90 tablet, Rfl: 3 .  pantoprazole (PROTONIX) 40 MG tablet, Take 1 tablet (40 mg total) by mouth daily., Disp: 30 tablet, Rfl: 3  No Known Allergies  Review of Systems   ROS  Negative unless otherwise specified per HPI.  Vitals:   Vitals:   10/30/19 1438  BP: 132/84  Pulse: 72  Temp: 97.6 F (36.4 C)  TempSrc: Temporal  SpO2: 96%  Weight: 234 lb 4 oz (106.3 kg)  Height: 5' 4.5" (1.638 m)     Body mass index is 39.59  kg/m.   Physical Exam:    Physical Exam Vitals and nursing note reviewed.  Constitutional:      General: She is not in acute distress.    Appearance: She is well-developed. She is not ill-appearing or toxic-appearing.  Cardiovascular:     Rate and Rhythm: Normal rate and regular rhythm.     Pulses: Normal pulses.     Heart sounds: Normal heart sounds, S1 normal and S2 normal.  Pulmonary:     Effort: Pulmonary effort is normal.     Breath sounds: Normal breath sounds.  Musculoskeletal:  Right lower leg: 1+ Edema present.     Left lower leg: 1+ Edema present.  Skin:    General: Skin is warm and dry.  Neurological:     Mental Status: She is alert.     GCS: GCS eye subscore is 4. GCS verbal subscore is 5. GCS motor subscore is 6.  Psychiatric:        Speech: Speech normal.        Behavior: Behavior normal. Behavior is cooperative.      Assessment and Plan:    Naijah was seen today for establish care and heartburn.  Diagnoses and all orders for this visit:  Gastroesophageal reflux disease with esophagitis without hemorrhage Uncontrolled. Will trial oral protonix to see if this gives her relief of symptoms until she can have her repeat hiatal hernia surgery.  Essential hypertension Controlled, but will resume her prior medication to see if this helps with her LE swelling. Restart Lisinopril-HCTZ 20-25 mg and follow-up in 3 months, sooner if concerns. -     CBC with Differential/Platelet -     Comprehensive metabolic panel  Insulin resistance Update A1c today and advise further based upon results. -     Hemoglobin A1c  Breast cancer screening by mammogram -     MM 3D SCREEN BREAST BILATERAL; Future  Screening for HIV (human immunodeficiency virus) -     HIV Antibody (routine testing w rflx)  Other orders -     lisinopril-hydrochlorothiazide (ZESTORETIC) 20-25 MG tablet; Take 1 tablet by mouth daily. -     pantoprazole (PROTONIX) 40 MG tablet; Take 1 tablet (40  mg total) by mouth daily.  . Reviewed expectations re: course of current medical issues. . Discussed self-management of symptoms. . Outlined signs and symptoms indicating need for more acute intervention. . Patient verbalized understanding and all questions were answered. . See orders for this visit as documented in the electronic medical record. . Patient received an After Visit Summary.  CMA or LPN served as scribe during this visit. History, Physical, and Plan performed by medical provider. The above documentation has been reviewed and is accurate and complete.   Inda Coke, PA-C [place], [place] 10/30/2019  Follow-up: No follow-ups on file.

## 2019-10-30 NOTE — Patient Instructions (Signed)
It was great to see you!  Start new blood pressure medication Lisinopril-HCTZ 20-25. Follow-up with me in 3 months.  I will be in touch with your lab results.  Please also start daily oral protonix for your heartburn -- this has been sent in for you.  Take care,  Jarold Motto PA-C

## 2019-10-31 LAB — CBC WITH DIFFERENTIAL/PLATELET
Basophils Absolute: 0 10*3/uL (ref 0.0–0.1)
Basophils Relative: 0.9 % (ref 0.0–3.0)
Eosinophils Absolute: 0.2 10*3/uL (ref 0.0–0.7)
Eosinophils Relative: 3.5 % (ref 0.0–5.0)
HCT: 36.2 % (ref 36.0–46.0)
Hemoglobin: 12 g/dL (ref 12.0–15.0)
Lymphocytes Relative: 51.3 % — ABNORMAL HIGH (ref 12.0–46.0)
Lymphs Abs: 2.3 10*3/uL (ref 0.7–4.0)
MCHC: 33.1 g/dL (ref 30.0–36.0)
MCV: 83.3 fl (ref 78.0–100.0)
Monocytes Absolute: 0.5 10*3/uL (ref 0.1–1.0)
Monocytes Relative: 11.8 % (ref 3.0–12.0)
Neutro Abs: 1.4 10*3/uL (ref 1.4–7.7)
Neutrophils Relative %: 32.5 % — ABNORMAL LOW (ref 43.0–77.0)
Platelets: 279 10*3/uL (ref 150.0–400.0)
RBC: 4.35 Mil/uL (ref 3.87–5.11)
RDW: 15.1 % (ref 11.5–15.5)
WBC: 4.4 10*3/uL (ref 4.0–10.5)

## 2019-10-31 LAB — COMPREHENSIVE METABOLIC PANEL
ALT: 10 U/L (ref 0–35)
AST: 14 U/L (ref 0–37)
Albumin: 4.2 g/dL (ref 3.5–5.2)
Alkaline Phosphatase: 67 U/L (ref 39–117)
BUN: 13 mg/dL (ref 6–23)
CO2: 29 mEq/L (ref 19–32)
Calcium: 9.8 mg/dL (ref 8.4–10.5)
Chloride: 103 mEq/L (ref 96–112)
Creatinine, Ser: 0.94 mg/dL (ref 0.40–1.20)
GFR: 74.89 mL/min (ref >60.00)
Glucose, Bld: 95 mg/dL (ref 70–99)
Potassium: 4.6 mEq/L (ref 3.5–5.1)
Sodium: 138 mEq/L (ref 135–145)
Total Bilirubin: 0.6 mg/dL (ref 0.2–1.2)
Total Protein: 7.4 g/dL (ref 6.0–8.3)

## 2019-10-31 LAB — HEMOGLOBIN A1C: Hgb A1c MFr Bld: 6.1 % (ref 4.6–6.5)

## 2019-10-31 LAB — HIV ANTIBODY (ROUTINE TESTING W REFLEX): HIV 1&2 Ab, 4th Generation: NONREACTIVE

## 2019-11-01 ENCOUNTER — Other Ambulatory Visit: Payer: Self-pay

## 2019-11-01 ENCOUNTER — Ambulatory Visit
Admission: RE | Admit: 2019-11-01 | Discharge: 2019-11-01 | Disposition: A | Discharge status: Home / Self Care | Payer: 59 | Source: Ambulatory Visit | Attending: Physician Assistant | Admitting: Physician Assistant

## 2019-11-01 DIAGNOSIS — Z1231 Encounter for screening mammogram for malignant neoplasm of breast: Secondary | ICD-10-CM

## 2019-11-05 ENCOUNTER — Other Ambulatory Visit: Payer: Self-pay | Admitting: Physician Assistant

## 2019-11-05 DIAGNOSIS — R928 Other abnormal and inconclusive findings on diagnostic imaging of breast: Secondary | ICD-10-CM

## 2019-11-05 DIAGNOSIS — K449 Diaphragmatic hernia without obstruction or gangrene: Secondary | ICD-10-CM | POA: Insufficient documentation

## 2019-11-21 ENCOUNTER — Ambulatory Visit: Payer: 59

## 2019-11-21 ENCOUNTER — Other Ambulatory Visit: Payer: Self-pay

## 2019-11-21 ENCOUNTER — Ambulatory Visit
Admission: RE | Admit: 2019-11-21 | Discharge: 2019-11-21 | Disposition: A | Discharge status: Home / Self Care | Payer: 59 | Source: Ambulatory Visit | Attending: Physician Assistant | Admitting: Physician Assistant

## 2019-11-21 DIAGNOSIS — R928 Other abnormal and inconclusive findings on diagnostic imaging of breast: Secondary | ICD-10-CM

## 2020-01-04 HISTORY — PX: HIATAL HERNIA REPAIR: SHX195

## 2020-01-30 ENCOUNTER — Ambulatory Visit (INDEPENDENT_AMBULATORY_CARE_PROVIDER_SITE_OTHER): Payer: 59 | Admitting: Physician Assistant

## 2020-01-30 ENCOUNTER — Other Ambulatory Visit: Payer: Self-pay

## 2020-01-30 ENCOUNTER — Encounter: Payer: Self-pay | Admitting: Physician Assistant

## 2020-01-30 VITALS — BP 90/58 | HR 81 | Temp 98.6°F | Ht 64.5 in | Wt 220.5 lb

## 2020-01-30 DIAGNOSIS — K2100 Gastro-esophageal reflux disease with esophagitis, without bleeding: Secondary | ICD-10-CM | POA: Diagnosis not present

## 2020-01-30 DIAGNOSIS — I10 Essential (primary) hypertension: Secondary | ICD-10-CM | POA: Diagnosis not present

## 2020-01-30 MED ORDER — LISINOPRIL 10 MG PO TABS: 10.0000 mg | ORAL_TABLET | Freq: Every day | ORAL | 1 refills | Status: DC

## 2020-01-30 MED ORDER — ONDANSETRON 4 MG PO TBDP: 4.0000 mg | ORAL_TABLET | Freq: Three times a day (TID) | ORAL | 0 refills | Status: DC | PRN

## 2020-01-30 NOTE — Patient Instructions (Signed)
It was great to see you!  Stop your Lisinopril-Hydrochlorothiazide  Start Lisinopril 10 mg  Check your blood pressures regularly -- I would like them to be between  110-140 --> top number 70-90 --> bottom number   Let's follow-up in 3-6 months, sooner if you have concerns.  Take care,  Jarold Motto PA-C

## 2020-01-30 NOTE — Progress Notes (Signed)
Natasha Doyle is a 54 y.o. female is here for follow up.  I acted as a Neurosurgeon for [employer], PA-C Corky Mull, LPN   History of Present Illness:   Chief Complaint  Patient presents with  . Gastroesophageal Reflux  . Hypertension    HPI   GERD Pt following up. Since she was last seen here she had a hiatal hernia repair with complications (ended up requiring bilateral chest tubes.) She is currently on Protonix 40 mg daily and tolerating well. She has lost at least 20 lb since last seeing me.  Hypertension Pt following up, currently taking Lisinopril-HCTZ 20-25 mg daily was changed at last visit in May. Pt says it is working well. Pt denies headaches, dizziness, blurred vision, chest pain, SOB or lower leg edema. Denies excessive caffeine intake, stimulant usage, excessive alcohol intake or increase in salt consumption.  BP Readings from Last 3 Encounters:  01/30/20 (!) 90/58  10/30/19 132/84  08/19/19 (!) 153/84     Health Maintenance Due  Topic Date Due  . Hepatitis C Screening  Never done  . COLONOSCOPY  Never done  . PAP SMEAR-Modifier  09/28/2016  . INFLUENZA VACCINE  01/13/2020    Past Medical History:  Diagnosis Date  . Diabetes mellitus without complication (HCC)    borderline  . GERD (gastroesophageal reflux disease)   . Hypertension   . Obesity   . Vaginal delivery 1989, 1992     Social History   Tobacco Use  . Smoking status: Never Smoker  . Smokeless tobacco: Never Used  Vaping Use  . Vaping Use: Never used  Substance Use Topics  . Alcohol use: Not Currently  . Drug use: No    Past Surgical History:  Procedure Laterality Date  . BREATH TEK H PYLORI N/A 10/22/2014   Procedure: BREATH TEK H PYLORI;  Surgeon: Gaynelle Adu, MD;  Location: [facility];  Service: General;  Laterality: N/A;  . CHOLECYSTECTOMY    . HERNIA REPAIR    . HIATAL HERNIA REPAIR  01/04/2020  . LAPAROSCOPIC GASTRIC SLEEVE RESECTION N/A 04/29/2015   Procedure:  LAPAROSCOPIC GASTRIC SLEEVE RESECTION;  Surgeon: Geoffry Paradise, MD;  Location: [facility];  Service: General;  Laterality: N/A;    Family History  Problem Relation Age of Onset  . Cancer Mother        breast  . Hypertension Mother   . Hyperlipidemia Mother   . Alcohol abuse Father   . Heart disease Brother   . Hyperlipidemia Brother   . Hypertension Brother     PMHx, SurgHx, SocialHx, FamHx, Medications, and Allergies were reviewed in the Visit Navigator and updated as appropriate.   Patient Active Problem List   Diagnosis Date Noted  . 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  . Gastroesophageal reflux disease 09/25/2019  . Status post bariatric surgery 03/30/2017    Social History   Tobacco Use  . Smoking status: Never Smoker  . Smokeless tobacco: Never Used  Vaping Use  . Vaping Use: Never used  Substance Use Topics  . Alcohol use: Not Currently  . Drug use: No    Current Medications and Allergies:    Current Outpatient Medications:  .  acetaminophen (TYLENOL) 325 MG tablet, Take 2 tablets (650 mg total) by mouth every 6 (six) hours as needed for mild pain or moderate pain., Disp: 60 tablet, Rfl: 0 .  Cholecalciferol 125 MCG (5000 UT) TABS, Take by mouth., Disp: , Rfl:  .  lisinopril-hydrochlorothiazide (ZESTORETIC) 20-25  MG tablet, Take 1 tablet by mouth daily., Disp: 90 tablet, Rfl: 3 .  Multiple Vitamins-Calcium (ONE-A-DAY WOMENS PO), Take 1 tablet by mouth daily., Disp: , Rfl:  .  pantoprazole (PROTONIX) 40 MG tablet, Take 1 tablet (40 mg total) by mouth daily., Disp: 30 tablet, Rfl: 3 .  HYDROcodone-acetaminophen (HYCET) 7.5-325 mg/15 ml solution, SMARTSIG:15 Milliliter(s) By Mouth Every 4 Hours PRN, Disp: , Rfl:  .  lisinopril (ZESTRIL) 10 MG tablet, Take 1 tablet (10 mg total) by mouth daily., Disp: 30 tablet, Rfl: 1 .  ondansetron (ZOFRAN ODT) 4 MG disintegrating tablet, Take 1 tablet (4 mg total) by mouth every 8 (eight) hours as needed for nausea or vomiting.,  Disp: 20 tablet, Rfl: 0  No Known Allergies  Review of Systems   ROS Negative unless otherwise specified per HPI.  Vitals:   Vitals:   01/30/20 1437  BP: (!) 90/58  Pulse: 81  Temp: 98.6 F (37 C)  TempSrc: Temporal  SpO2: 98%  Weight: 220 lb 8 oz (100 kg)  Height: 5' 4.5" (1.638 m)     Body mass index is 37.26 kg/m.   Physical Exam:    Physical Exam Vitals and nursing note reviewed.  Constitutional:      General: She is not in acute distress.    Appearance: She is well-developed. She is not ill-appearing or toxic-appearing.  Cardiovascular:     Rate and Rhythm: Normal rate and regular rhythm.     Pulses: Normal pulses.     Heart sounds: Normal heart sounds, S1 normal and S2 normal.     Comments: No LE edema Pulmonary:     Effort: Pulmonary effort is normal.     Breath sounds: Normal breath sounds.  Skin:    General: Skin is warm and dry.  Neurological:     Mental Status: She is alert.     GCS: GCS eye subscore is 4. GCS verbal subscore is 5. GCS motor subscore is 6.  Psychiatric:        Speech: Speech normal.        Behavior: Behavior normal. Behavior is cooperative.      Assessment and Plan:    Natasha Doyle was seen today for gastroesophageal reflux and hypertension.  Diagnoses and all orders for this visit:  Gastroesophageal reflux disease with esophagitis without hemorrhage Well controlled. Continue Protonix 40 mg daily. She also endorses occasional nausea -- will refill zofran.  Essential hypertension Too tightly controlled, likely due to recent weight loss. Stop current rx of Lisinopril 20- HCTZ 25. Start Lisinopril 10 mg daily; recheck blood pressures regularly. Parameters provided on AVS. Follow-up in 3-6 months, sooner if concerns.  Other orders -     lisinopril (ZESTRIL) 10 MG tablet; Take 1 tablet (10 mg total) by mouth daily. -     ondansetron (ZOFRAN ODT) 4 MG disintegrating tablet; Take 1 tablet (4 mg total) by mouth every 8 (eight)  hours as needed for nausea or vomiting.    . Reviewed expectations re: course of current medical issues. . Discussed self-management of symptoms. . Outlined signs and symptoms indicating need for more acute intervention. . Patient verbalized understanding and all questions were answered. . See orders for this visit as documented in the electronic medical record. . Patient received an After Visit Summary.  CMA or LPN served as scribe during this visit. History, Physical, and Plan performed by medical provider. The above documentation has been reviewed and is accurate and complete.  Jarold Motto, PA-C [place],  [place] 01/30/2020  Follow-up: No follow-ups on file.

## 2020-02-26 ENCOUNTER — Telehealth: Payer: Self-pay | Admitting: Physician Assistant

## 2020-02-26 NOTE — Telephone Encounter (Signed)
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 Pt asked if CMA or Lelon Mast could give her a call to help her figure out what she should do to prevent getting COVID. Pt is concerned due to her medical history. Please advise.

## 2020-02-26 NOTE — Telephone Encounter (Signed)
Called pt and scheduled a virtual visit with [facility] for tomorrow at 11:30.

## 2020-02-27 ENCOUNTER — Telehealth: Payer: 59 | Admitting: Physician Assistant

## 2020-02-27 NOTE — Progress Notes (Signed)
Patient's phone went straight to voicemail when we called her for her appointment.  Mailbox was full and we were unable to leave a message.  Patient was not seen by me.

## 2020-03-05 ENCOUNTER — Ambulatory Visit: Payer: 59 | Admitting: Podiatry

## 2020-03-19 ENCOUNTER — Encounter: Payer: Self-pay | Admitting: Intensive Care

## 2020-03-19 ENCOUNTER — Other Ambulatory Visit: Payer: Self-pay

## 2020-03-19 ENCOUNTER — Emergency Department
Admission: EM | Admit: 2020-03-19 | Discharge: 2020-03-19 | Disposition: A | Discharge status: Home / Self Care | Payer: 59 | Attending: Emergency Medicine | Admitting: Emergency Medicine

## 2020-03-19 DIAGNOSIS — I10 Essential (primary) hypertension: Secondary | ICD-10-CM | POA: Insufficient documentation

## 2020-03-19 DIAGNOSIS — Z5321 Procedure and treatment not carried out due to patient leaving prior to being seen by health care provider: Secondary | ICD-10-CM | POA: Insufficient documentation

## 2020-03-19 DIAGNOSIS — R42 Dizziness and giddiness: Secondary | ICD-10-CM | POA: Diagnosis not present

## 2020-03-19 DIAGNOSIS — R220 Localized swelling, mass and lump, head: Secondary | ICD-10-CM | POA: Diagnosis present

## 2020-03-19 NOTE — ED Triage Notes (Signed)
First RN Note: Pt presents to ED via [facility] with c/o stung by possible bee/wasp to the tongue. Per EMS mild swelling to the tongue noted. Per EMS no difficulty breathing, some difficulty swallowing and dizziness, HTN en route with hx of same, NSR, clear lung sounds. 50 mg Benadryl given IV en route.   18G to L [facility]

## 2020-03-19 NOTE — ED Triage Notes (Addendum)
Patient arrived by EMS. Reports a bee stung the tip of her tongue. NAD noted. No difficulty breathing or trouble swallowing. EMS administered 50mg  benadryl IV en route. Pt c/o numbness/tingling to her tongue and lips

## 2020-04-29 ENCOUNTER — Other Ambulatory Visit: Payer: Self-pay

## 2020-04-29 ENCOUNTER — Ambulatory Visit (INDEPENDENT_AMBULATORY_CARE_PROVIDER_SITE_OTHER): Payer: 59 | Admitting: Physician Assistant

## 2020-04-29 ENCOUNTER — Encounter: Payer: Self-pay | Admitting: Physician Assistant

## 2020-04-29 VITALS — BP 136/80 | HR 71 | Temp 98.0°F | Ht 64.0 in | Wt 233.0 lb

## 2020-04-29 DIAGNOSIS — R1010 Upper abdominal pain, unspecified: Secondary | ICD-10-CM | POA: Diagnosis not present

## 2020-04-29 DIAGNOSIS — R0981 Nasal congestion: Secondary | ICD-10-CM

## 2020-04-29 NOTE — Progress Notes (Signed)
Natasha Doyle is a 54 y.o. female here for a new problem.  I acted as a Neurosurgeon for [employer], PA-C Corky Mull, LPN   History of Present Illness:   Chief Complaint  Patient presents with  . Abdominal Pain    HPI   Abdominal pain Pt c/o abd pain off and on x 1 month, upper abdominal pain. About a month ago she was bending over to tie her shoe and felt sudden onset of significant pain to LUQ. She had to brace her abdomen when this happened. Sharp pain subsided but has had ongoing dull pain since that time. She has hx of hiatal hernia repair in July and is concerned there is something wrong at her surgical site. Has an appt scheduled next week to see the surgeon, she was called to schedule this right after she made todays appointment. Pt had diarrhea over the weekend has resolved. Denies: rectal bleeding, nausea/vomiting, inability to take po's, constipation.  Nasal congestion Reports nasal congestion for a week or two. Thinks she may have a cold due to the weather change. Denies: cough, SOB, ear pain, sore throat, fever, chills, known sick exposures.  Past Medical History:  Diagnosis Date  . Diabetes mellitus without complication (HCC)    borderline  . GERD (gastroesophageal reflux disease)   . Hypertension   . Obesity   . Vaginal delivery 1989, 1992     Social History   Tobacco Use  . Smoking status: Never Smoker  . Smokeless tobacco: Never Used  Vaping Use  . Vaping Use: Never used  Substance Use Topics  . Alcohol use: Not Currently  . Drug use: No    Past Surgical History:  Procedure Laterality Date  . BREATH TEK H PYLORI N/A 10/22/2014   Procedure: BREATH TEK H PYLORI;  Surgeon: Gaynelle Adu, MD;  Location: [facility];  Service: General;  Laterality: N/A;  . CHOLECYSTECTOMY    . HERNIA REPAIR    . HIATAL HERNIA REPAIR  01/04/2020  . LAPAROSCOPIC GASTRIC SLEEVE RESECTION N/A 04/29/2015   Procedure: LAPAROSCOPIC GASTRIC SLEEVE RESECTION;  Surgeon: Geoffry Paradise, MD;  Location: [facility];  Service: General;  Laterality: N/A;    Family History  Problem Relation Age of Onset  . Cancer Mother        breast  . Hypertension Mother   . Hyperlipidemia Mother   . Alcohol abuse Father   . Heart disease Brother   . Hyperlipidemia Brother   . Hypertension Brother     No Known Allergies  Current Medications:   Current Outpatient Medications:  .  acetaminophen (TYLENOL) 325 MG tablet, Take 2 tablets (650 mg total) by mouth every 6 (six) hours as needed for mild pain or moderate pain., Disp: 60 tablet, Rfl: 0 .  Cholecalciferol 125 MCG (5000 UT) TABS, Take by mouth., Disp: , Rfl:  .  lisinopril (ZESTRIL) 10 MG tablet, Take 1 tablet (10 mg total) by mouth daily., Disp: 30 tablet, Rfl: 1 .  Multiple Vitamins-Calcium (ONE-A-DAY WOMENS PO), Take 1 tablet by mouth daily., Disp: , Rfl:  .  ondansetron (ZOFRAN ODT) 4 MG disintegrating tablet, Take 1 tablet (4 mg total) by mouth every 8 (eight) hours as needed for nausea or vomiting., Disp: 20 tablet, Rfl: 0 .  pantoprazole (PROTONIX) 40 MG tablet, Take 1 tablet (40 mg total) by mouth daily., Disp: 30 tablet, Rfl: 3   Review of Systems:   ROS Negative unless otherwise specified per HPI.  Vitals:  Vitals:   04/29/20 1108  BP: 136/80  Pulse: 71  Temp: 98 F (36.7 C)  TempSrc: Temporal  SpO2: 98%  Weight: 233 lb (105.7 kg)  Height: 5\' 4"  (1.626 m)     Body mass index is 39.99 kg/m.  Physical Exam:   Physical Exam Vitals and nursing note reviewed.  Constitutional:      General: She is not in acute distress.    Appearance: She is well-developed. She is not ill-appearing or toxic-appearing.  HENT:     Head: Normocephalic and atraumatic.     Right Ear: Tympanic membrane, ear canal and external ear normal. Tympanic membrane is not erythematous, retracted or bulging.     Left Ear: Tympanic membrane, ear canal and external ear normal. Tympanic membrane is not erythematous, retracted or  bulging.     Nose: Nose normal.     Right Sinus: No maxillary sinus tenderness or frontal sinus tenderness.     Left Sinus: No maxillary sinus tenderness or frontal sinus tenderness.     Mouth/Throat:     Pharynx: Uvula midline. No posterior oropharyngeal erythema.  Eyes:     General: Lids are normal.     Conjunctiva/sclera: Conjunctivae normal.  Neck:     Trachea: Trachea normal.  Cardiovascular:     Rate and Rhythm: Normal rate and regular rhythm.     Pulses: Normal pulses.     Heart sounds: Normal heart sounds, S1 normal and S2 normal.     Comments: No LE edema Pulmonary:     Effort: Pulmonary effort is normal.     Breath sounds: Normal breath sounds. No decreased breath sounds, wheezing, rhonchi or rales.  Abdominal:     General: Abdomen is flat. Bowel sounds are normal.     Palpations: Abdomen is soft.     Tenderness: There is no guarding or rebound.  Lymphadenopathy:     Cervical: No cervical adenopathy.  Skin:    General: Skin is warm and dry.  Neurological:     Mental Status: She is alert.     GCS: GCS eye subscore is 4. GCS verbal subscore is 5. GCS motor subscore is 6.  Psychiatric:        Speech: Speech normal.        Behavior: Behavior normal. Behavior is cooperative.      Assessment and Plan:   Clarrisa was seen today for abdominal pain.  Diagnoses and all orders for this visit:  Pain of upper abdomen She would like to decline imaging and further work-up to her surgeon's appointment next week, I think that this is completely reasonable. No evidence of acute abdomen. Will have her do soft diet and stay well hydrated in the interim. Worsening precautions advised.  Nasal congestion No red flags on exam. Trial mucinex dm. Suspect seasonal allergies. No signs of infection on exam. Worsening precautions advised and if no improvement, recommend follow-up (virtual.)  CMA or LPN served as scribe during this visit. History, Physical, and Plan performed by  medical provider. The above documentation has been reviewed and is accurate and complete.  Rinaldo Cloud, PA-C

## 2020-04-29 NOTE — Patient Instructions (Addendum)
It was great to see you!  Soft diet and plenty of liquids until you see your surgeon.  You can use Robitussin DM Mucinex and Mucinex DM for cough.  Push fluids and get plenty of rest. Please call if you are not improving as expected, or if you have high fevers (>101.5) or difficulty swallowing or worsening productive cough.  Call clinic with questions.  I hope you start feeling better soon!  Take care,  Jarold Motto PA-C

## 2020-06-17 DIAGNOSIS — Z6841 Body Mass Index (BMI) 40.0 and over, adult: Secondary | ICD-10-CM | POA: Insufficient documentation

## 2020-06-27 ENCOUNTER — Encounter: Payer: Self-pay | Admitting: Physician Assistant

## 2020-06-27 ENCOUNTER — Telehealth (INDEPENDENT_AMBULATORY_CARE_PROVIDER_SITE_OTHER): Payer: 59 | Admitting: Physician Assistant

## 2020-06-27 ENCOUNTER — Other Ambulatory Visit: Payer: Self-pay

## 2020-06-27 DIAGNOSIS — U071 COVID-19: Secondary | ICD-10-CM | POA: Diagnosis not present

## 2020-06-27 NOTE — Progress Notes (Signed)
Virtual Visit via Video   I connected with Natasha Doyle on 06/27/20 at  2:00 PM EST by a video enabled telemedicine application and verified that I am speaking with the correct person using two identifiers. Location patient: Home Location provider: [facility], Office Persons participating in the virtual visit: [place], Ruud PA-C, Corky Mull, LPN   I discussed the limitations of evaluation and management by telemedicine and the availability of in person appointments. The patient expressed understanding and agreed to proceed.  I acted as a Neurosurgeon for [employer], PA-C [employer], LPN   Subjective:   HPI:   Patient is requesting evaluation for possible COVID-19.  Symptom onset: yesterday  Travel/contacts: exposed at work, healthcare facility  Vaccination status: 2 shots  Testing results: Tested at work on Monday; just had rapid test that was POSITIVE  Patient endorses the following symptoms: Body aches and fever 101.1 down to 99 this morning, headache, Rhinorrhea clear drainage No significant cough  Patient denies the following symptoms: Chest pain, SOB, sore throat  No prior hx of asthma/COPD  Treatments tried: Tylenol and Mucinex  Patient risk factors: Current COVID-19 risk of complications score: 2 Smoking status: Natasha Doyle  reports that she has never smoked. She has never used smokeless tobacco. If female, currently pregnant? []   Yes []   No  ROS: See pertinent positives and negatives per HPI.  Patient Active Problem List   Diagnosis Date Noted  . BMI 40.0-44.9, adult (HCC) 06/17/2020  . 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  . Gastroesophageal reflux disease 09/25/2019  . Status post bariatric surgery 03/30/2017  . Essential hypertension 09/28/2013    Social History   Tobacco Use  . Smoking status: Never Smoker  . Smokeless tobacco: Never Used  Substance Use Topics  . Alcohol use: Not Currently    Current  Outpatient Medications:  .  acetaminophen (TYLENOL) 325 MG tablet, Take 2 tablets (650 mg total) by mouth every 6 (six) hours as needed for mild pain or moderate pain., Disp: 60 tablet, Rfl: 0 .  Cholecalciferol 125 MCG (5000 UT) TABS, Take by mouth., Disp: , Rfl:  .  lisinopril (ZESTRIL) 10 MG tablet, Take 1 tablet (10 mg total) by mouth daily., Disp: 30 tablet, Rfl: 1 .  Multiple Vitamins-Calcium (ONE-A-DAY WOMENS PO), Take 1 tablet by mouth daily., Disp: , Rfl:  .  ondansetron (ZOFRAN ODT) 4 MG disintegrating tablet, Take 1 tablet (4 mg total) by mouth every 8 (eight) hours as needed for nausea or vomiting., Disp: 20 tablet, Rfl: 0 .  pantoprazole (PROTONIX) 40 MG tablet, Take 1 tablet (40 mg total) by mouth daily., Disp: 30 tablet, Rfl: 3  No Known Allergies  Objective:   VITALS: Per patient if applicable, see vitals. GENERAL: Alert, appears well and in no acute distress. HEENT: Atraumatic, conjunctiva clear, no obvious abnormalities on inspection of external nose and ears. NECK: Normal movements of the head and neck. CARDIOPULMONARY: No increased WOB. Speaking in clear sentences. I:E ratio WNL.  MS: Moves all visible extremities without noticeable abnormality. PSYCH: Pleasant and cooperative, well-groomed. Speech normal rate and rhythm. Affect is appropriate. Insight and judgement are appropriate. Attention is focused, linear, and appropriate.  NEURO: CN grossly intact. Oriented as arrived to appointment on time with no prompting. Moves both UE equally.  SKIN: No obvious lesions, wounds, erythema, or cyanosis noted on face or hands.  Assessment and Plan:   [name] was seen today for covid symptoms.  Diagnoses and  all orders for this visit:  COVID-19   No red flags on discussion, patient is not in any obvious distress during our visit. Discussed progression of most viral illnesses, and recommended supportive care at this point in time.  Discussed over the counter supportive care  options, including Tylenol 500 mg q 8 hours, with recommendations to push fluids and rest. Reviewed return precautions including new/worsening fever, SOB, new/worsening cough, sudden onset changes of symptoms. Recommended need to self-quarantine and practice social distancing until symptoms resolve. I recommend that patient follow-up if symptoms worsen or persist despite treatment x 7-10 days, sooner if needed.  I discussed the assessment and treatment plan with the patient. The patient was provided an opportunity to ask questions and all were answered. The patient agreed with the plan and demonstrated an understanding of the instructions.   The patient was advised to call back or seek an in-person evaluation if the symptoms worsen or if the condition fails to improve as anticipated.   CMA or LPN served as scribe during this visit. History, Physical, and Plan performed by medical provider. The above documentation has been reviewed and is accurate and complete.  [place], [name] 06/27/2020

## 2020-06-28 ENCOUNTER — Other Ambulatory Visit: Payer: 59

## 2020-06-30 ENCOUNTER — Ambulatory Visit (HOSPITAL_COMMUNITY)
Admission: EM | Admit: 2020-06-30 | Discharge: 2020-06-30 | Disposition: A | Discharge status: Home / Self Care | Payer: 59

## 2020-07-16 ENCOUNTER — Encounter: Payer: Self-pay | Admitting: Physician Assistant

## 2020-07-16 ENCOUNTER — Other Ambulatory Visit: Payer: Self-pay | Admitting: Physician Assistant

## 2020-07-16 ENCOUNTER — Other Ambulatory Visit: Payer: Self-pay

## 2020-07-16 ENCOUNTER — Ambulatory Visit (INDEPENDENT_AMBULATORY_CARE_PROVIDER_SITE_OTHER): Payer: 59 | Admitting: Physician Assistant

## 2020-07-16 VITALS — BP 140/90 | HR 64 | Temp 97.8°F | Ht 64.0 in | Wt 246.4 lb

## 2020-07-16 DIAGNOSIS — K148 Other diseases of tongue: Secondary | ICD-10-CM

## 2020-07-16 DIAGNOSIS — I10 Essential (primary) hypertension: Secondary | ICD-10-CM

## 2020-07-16 DIAGNOSIS — E8881 Metabolic syndrome: Secondary | ICD-10-CM | POA: Diagnosis not present

## 2020-07-16 DIAGNOSIS — Z903 Acquired absence of stomach [part of]: Secondary | ICD-10-CM | POA: Diagnosis not present

## 2020-07-16 LAB — COMPREHENSIVE METABOLIC PANEL
ALT: 11 U/L (ref 0–35)
AST: 14 U/L (ref 0–37)
Albumin: 3.6 g/dL (ref 3.5–5.2)
Alkaline Phosphatase: 60 U/L (ref 39–117)
BUN: 14 mg/dL (ref 6–23)
CO2: 28 mEq/L (ref 19–32)
Calcium: 9.2 mg/dL (ref 8.4–10.5)
Chloride: 107 mEq/L (ref 96–112)
Creatinine, Ser: 0.76 mg/dL (ref 0.40–1.20)
GFR: 88.21 mL/min (ref >60.00)
Glucose, Bld: 96 mg/dL (ref 70–99)
Potassium: 3.9 mEq/L (ref 3.5–5.1)
Sodium: 138 mEq/L (ref 135–145)
Total Bilirubin: 0.7 mg/dL (ref 0.2–1.2)
Total Protein: 6.9 g/dL (ref 6.0–8.3)

## 2020-07-16 LAB — CBC WITH DIFFERENTIAL/PLATELET
Basophils Absolute: 0 10*3/uL (ref 0.0–0.1)
Basophils Relative: 0.5 % (ref 0.0–3.0)
Eosinophils Absolute: 0.2 10*3/uL (ref 0.0–0.7)
Eosinophils Relative: 4.4 % (ref 0.0–5.0)
HCT: 31.9 % — ABNORMAL LOW (ref 36.0–46.0)
Hemoglobin: 10.6 g/dL — ABNORMAL LOW (ref 12.0–15.0)
Lymphocytes Relative: 46.6 % — ABNORMAL HIGH (ref 12.0–46.0)
Lymphs Abs: 2.4 10*3/uL (ref 0.7–4.0)
MCHC: 33.3 g/dL (ref 30.0–36.0)
MCV: 80.4 fl (ref 78.0–100.0)
Monocytes Absolute: 0.5 10*3/uL (ref 0.1–1.0)
Monocytes Relative: 9.7 % (ref 3.0–12.0)
Neutro Abs: 2 10*3/uL (ref 1.4–7.7)
Neutrophils Relative %: 38.8 % — ABNORMAL LOW (ref 43.0–77.0)
Platelets: 265 10*3/uL (ref 150.0–400.0)
RBC: 3.97 Mil/uL (ref 3.87–5.11)
RDW: 15.6 % — ABNORMAL HIGH (ref 11.5–15.5)
WBC: 5.2 10*3/uL (ref 4.0–10.5)

## 2020-07-16 LAB — VITAMIN B12: Vitamin B-12: 442 pg/mL (ref 211–911)

## 2020-07-16 LAB — VITAMIN D 25 HYDROXY (VIT D DEFICIENCY, FRACTURES): VITD: 20.38 ng/mL — ABNORMAL LOW (ref 30.00–100.00)

## 2020-07-16 LAB — POCT GLYCOSYLATED HEMOGLOBIN (HGB A1C): Hemoglobin A1C: 5.7 % — AB (ref 4.0–5.6)

## 2020-07-16 MED ORDER — TRIAMCINOLONE ACETONIDE 0.1 % MT PSTE: 1.0000 "application " | PASTE | Freq: Two times a day (BID) | OROMUCOSAL | 1 refills | Status: DC

## 2020-07-16 MED ORDER — LISINOPRIL 10 MG PO TABS: 10.0000 mg | ORAL_TABLET | Freq: Every day | ORAL | 3 refills | Status: DC

## 2020-07-16 MED ORDER — VITAMIN D (ERGOCALCIFEROL) 1.25 MG (50000 UNIT) PO CAPS: 50000.0000 [IU] | ORAL_CAPSULE | ORAL | 0 refills | Status: DC

## 2020-07-16 NOTE — Patient Instructions (Signed)
It was great to see you!  Tylenol arthritis over the counter medication as needed for your arthritis  Use the dental paste for your ulcer  Update blood work today  Resume Lisinopril 10 mg daily  Please start bariatric multivitamins, this is important!  Take care,  Jarold Motto PA-C

## 2020-07-16 NOTE — Progress Notes (Signed)
Natasha Doyle is a 55 y.o. female is here for follow up.  I acted as a Neurosurgeon for [employer], PA-C Corky Mull, LPN   History of Present Illness:   Chief Complaint  Patient presents with  . Hypertension  . ulcer on tongue    HPI   Hypertension Pt following up, currently taking Lisinopril 10 mg daily. She has been out of medication x 1 week. She has not been checking blood pressure at home. She has been having some headaches and blurred vision since off her medicine. Pt denies dizziness, chest pain, SOB or lower leg edema. Denies excessive caffeine intake, stimulant usage, excessive alcohol intake or increase in salt consumption.  BP Readings from Last 3 Encounters:  07/16/20 140/90  04/29/20 136/80  03/19/20 (!) 155/88    Ulcer on tongue Pt c/o ulcer on left side of tongue off and on x 1 month. Went to the dentist in June and they recommended she get all of her teeth removed. Has had these almost all her life. She is concerned that she may have a vitamin deficiency. She is not taking her bariatric MVIs regularly.   Pre-diabetes/Hx gastric bypass Has had some weight gain. Planning to possibly have the duodenal-switch soon. She is meeting with surgeon soon to discuss this. Continues to work on better lifestyle choices but does admit to eating noodles and drinking frappe's regularly.    Health Maintenance Due  Topic Date Due  . Hepatitis C Screening  Never done  . COLONOSCOPY (Pts 45-71yrs Insurance coverage will need to be confirmed)  Never done  . PAP SMEAR-Modifier  09/28/2016  . COVID-19 Vaccine (3 - Booster for [employer] series) 06/03/2020    Past Medical History:  Diagnosis Date  . Diabetes mellitus without complication (HCC)    borderline  . GERD (gastroesophageal reflux disease)   . Hypertension   . Obesity   . Vaginal delivery 1989, 1992     Social History   Tobacco Use  . Smoking status: Never Smoker  . Smokeless tobacco: Never Used  Vaping  Use  . Vaping Use: Never used  Substance Use Topics  . Alcohol use: Not Currently  . Drug use: No    Past Surgical History:  Procedure Laterality Date  . BREATH TEK H PYLORI N/A 10/22/2014   Procedure: BREATH TEK H PYLORI;  Surgeon: Gaynelle Adu, MD;  Location: [facility];  Service: General;  Laterality: N/A;  . CHOLECYSTECTOMY    . HERNIA REPAIR    . HIATAL HERNIA REPAIR  01/04/2020  . LAPAROSCOPIC GASTRIC SLEEVE RESECTION N/A 04/29/2015   Procedure: LAPAROSCOPIC GASTRIC SLEEVE RESECTION;  Surgeon: Geoffry Paradise, MD;  Location: [facility];  Service: General;  Laterality: N/A;    Family History  Problem Relation Age of Onset  . Cancer Mother        breast  . Hypertension Mother   . Hyperlipidemia Mother   . Alcohol abuse Father   . Heart disease Brother   . Hyperlipidemia Brother   . Hypertension Brother     PMHx, SurgHx, SocialHx, FamHx, Medications, and Allergies were reviewed in the Visit Navigator and updated as appropriate.   Patient Active Problem List   Diagnosis Date Noted  . BMI 40.0-44.9, adult (HCC) 06/17/2020  . 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  . Gastroesophageal reflux disease 09/25/2019  . Essential hypertension 09/28/2013    Social History   Tobacco Use  . Smoking status: Never Smoker  . Smokeless tobacco: Never Used  Vaping Use  . Vaping Use: Never used  Substance Use Topics  . Alcohol use: Not Currently  . Drug use: No    Current Medications and Allergies:    Current Outpatient Medications:  .  acetaminophen (TYLENOL) 325 MG tablet, Take 2 tablets (650 mg total) by mouth every 6 (six) hours as needed for mild pain or moderate pain., Disp: 60 tablet, Rfl: 0 .  Cholecalciferol 125 MCG (5000 UT) TABS, Take by mouth., Disp: , Rfl:  .  Multiple Vitamins-Calcium (ONE-A-DAY WOMENS PO), Take 1 tablet by mouth daily., Disp: , Rfl:  .  ondansetron (ZOFRAN ODT) 4 MG disintegrating tablet, Take 1 tablet (4 mg total) by mouth every 8 (eight) hours  as needed for nausea or vomiting., Disp: 20 tablet, Rfl: 0 .  pantoprazole (PROTONIX) 40 MG tablet, Take 1 tablet (40 mg total) by mouth daily., Disp: 30 tablet, Rfl: 3 .  triamcinolone (KENALOG) 0.1 % paste, Use as directed 1 application in the mouth or throat 2 (two) times daily., Disp: 5 g, Rfl: 1 .  lisinopril (ZESTRIL) 10 MG tablet, Take 1 tablet (10 mg total) by mouth daily., Disp: 90 tablet, Rfl: 3  No Known Allergies  Review of Systems   ROS  Negative unless otherwise specified per HPI.   Vitals:   Vitals:   07/16/20 0944  BP: 140/90  Pulse: 64  Temp: 97.8 F (36.6 C)  TempSrc: Temporal  SpO2: 98%  Weight: 246 lb 6.1 oz (111.8 kg)  Height: 5\' 4"  (1.626 m)     Body mass index is 42.29 kg/m.   Physical Exam:    Physical Exam Vitals and nursing note reviewed.  Constitutional:      General: She is not in acute distress.    Appearance: She is well-developed. She is not ill-appearing, toxic-appearing or sickly-appearing.  HENT:     Mouth/Throat:   Cardiovascular:     Rate and Rhythm: Normal rate and regular rhythm.     Pulses: Normal pulses.     Heart sounds: Normal heart sounds, S1 normal and S2 normal.     Comments: No LE edema Pulmonary:     Effort: Pulmonary effort is normal.     Breath sounds: Normal breath sounds.  Skin:    General: Skin is warm, dry and intact.  Neurological:     Mental Status: She is alert.     GCS: GCS eye subscore is 4. GCS verbal subscore is 5. GCS motor subscore is 6.  Psychiatric:        Mood and Affect: Mood and affect normal.        Speech: Speech normal.        Behavior: Behavior normal. Behavior is cooperative.    Results for orders placed or performed in visit on 07/16/20  POCT glycosylated hemoglobin (Hb A1C)  Result Value Ref Range   Hemoglobin A1C 5.7 (A) 4.0 - 5.6 %     Assessment and Plan:    [place] was seen today for hypertension and ulcer on tongue.  Diagnoses and all orders for this visit:  Insulin  resistance Updated A1c today is improved. Continue healthy diet and lifestyle. Work on less sugars. No medication changes. -     POCT glycosylated hemoglobin (Hb A1C)  Essential hypertension At goal but has been without medication. Restart lisinopril 10 mg daily. Follow-up in 1 year, sooner if concerns.  History of sleeve gastrectomy Discussed importance of taking bariatric MVI. Handout of examples provided. Follow-up based on results,  continue to work with surgeon on possible duodenal switch transition. -     Vitamin B12 -     CBC with Differential/Platelet -     Comprehensive metabolic panel -     VITAMIN D 25 Hydroxy (Vit-D Deficiency, Fractures)  Tongue lesion No red flags on exam. Trial kenalog paste. Update blood work and recommend resuming bariatric MVI.  Other orders -     triamcinolone (KENALOG) 0.1 % paste; Use as directed 1 application in the mouth or throat 2 (two) times daily. -     lisinopril (ZESTRIL) 10 MG tablet; Take 1 tablet (10 mg total) by mouth daily.   CMA or LPN served as scribe during this visit. History, Physical, and Plan performed by medical provider. The above documentation has been reviewed and is accurate and complete.   Jarold Motto, PA-C [place], [place] 07/16/2020  Follow-up: No follow-ups on file.

## 2020-08-21 IMAGING — MG DIGITAL SCREENING BILAT W/ TOMO W/ CAD
8 series · 8 of 24 positions shown · non-contrast
Comparison: Previous exam(s).

CLINICAL DATA: Screening.

EXAM:
DIGITAL SCREENING BILATERAL MAMMOGRAM WITH TOMO AND CAD

[L MLO synth-2D]
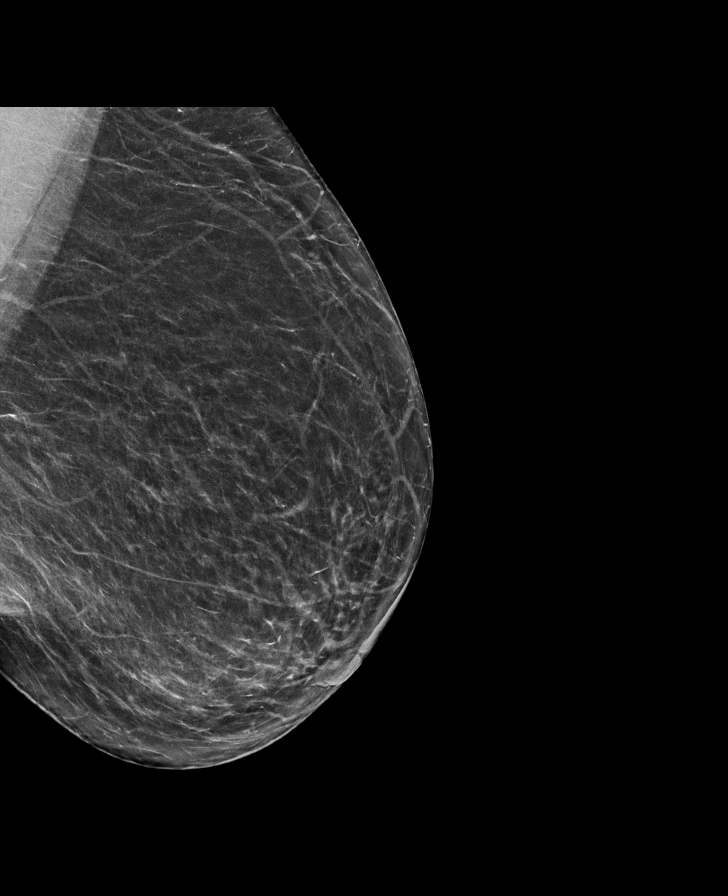

[R CC synth-2D]
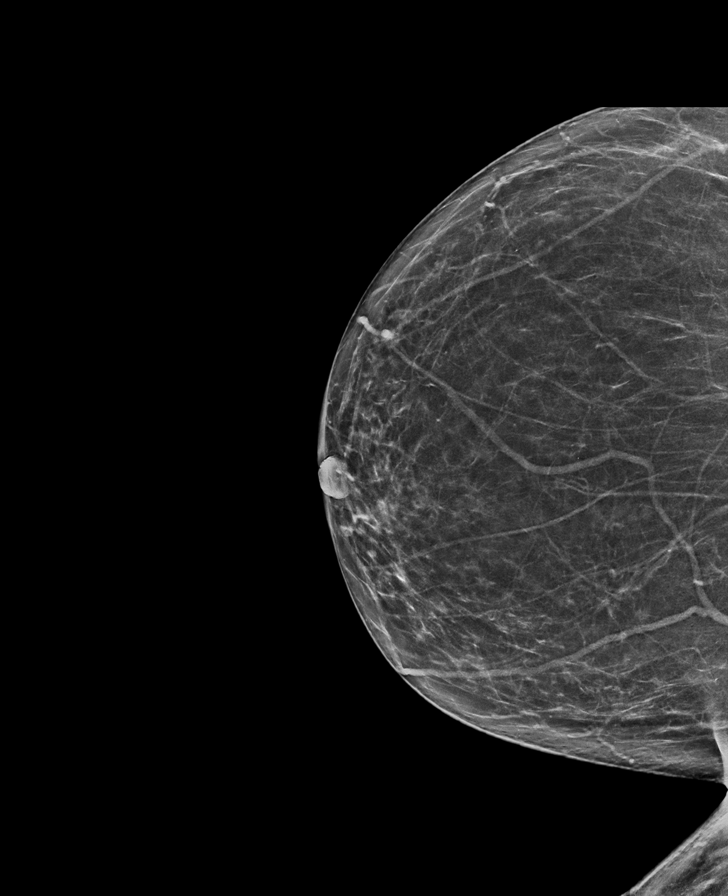

[R MLO synth-2D]
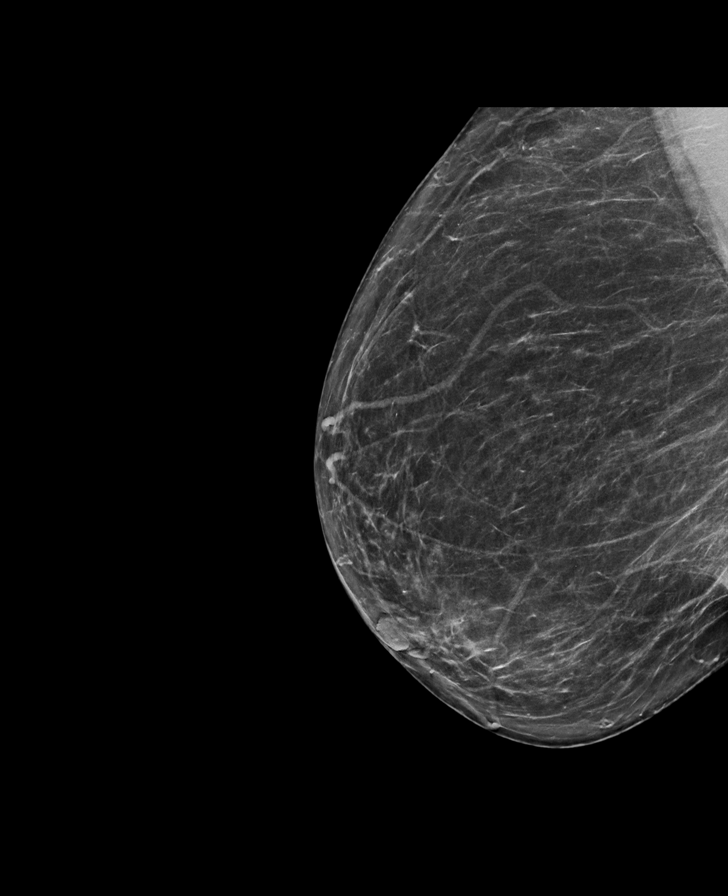

[L CC synth-2D]
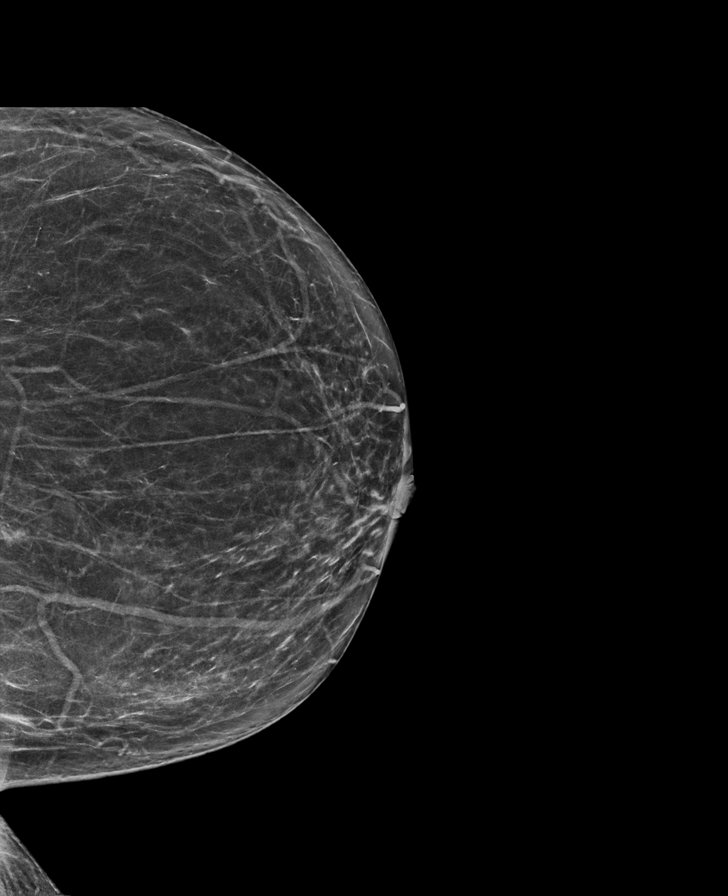

[R MLO tomo · tomo slice 39/76.0]
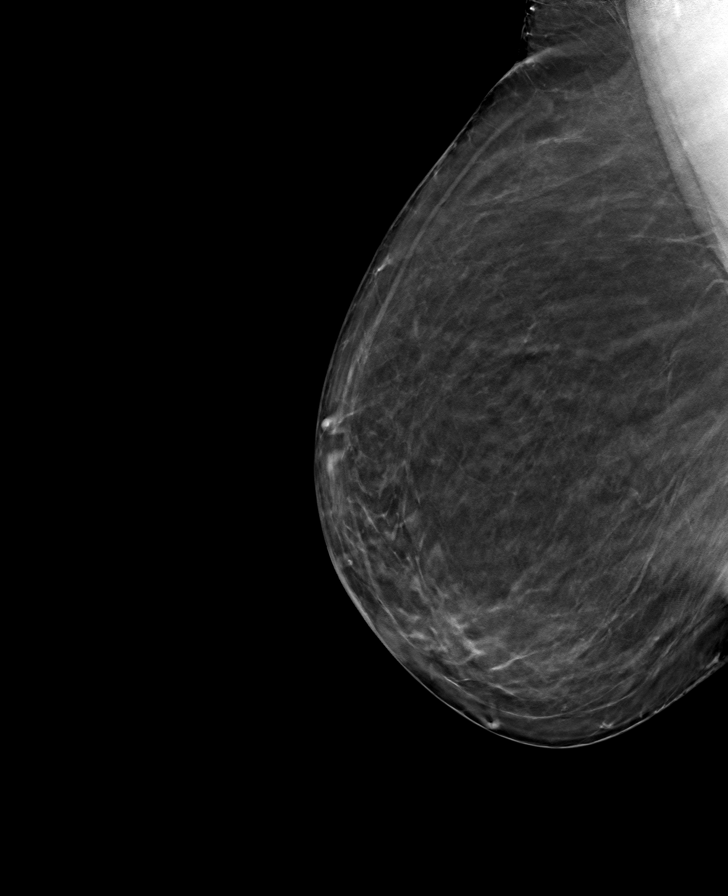

[L MLO tomo · tomo slice 37/73.0]
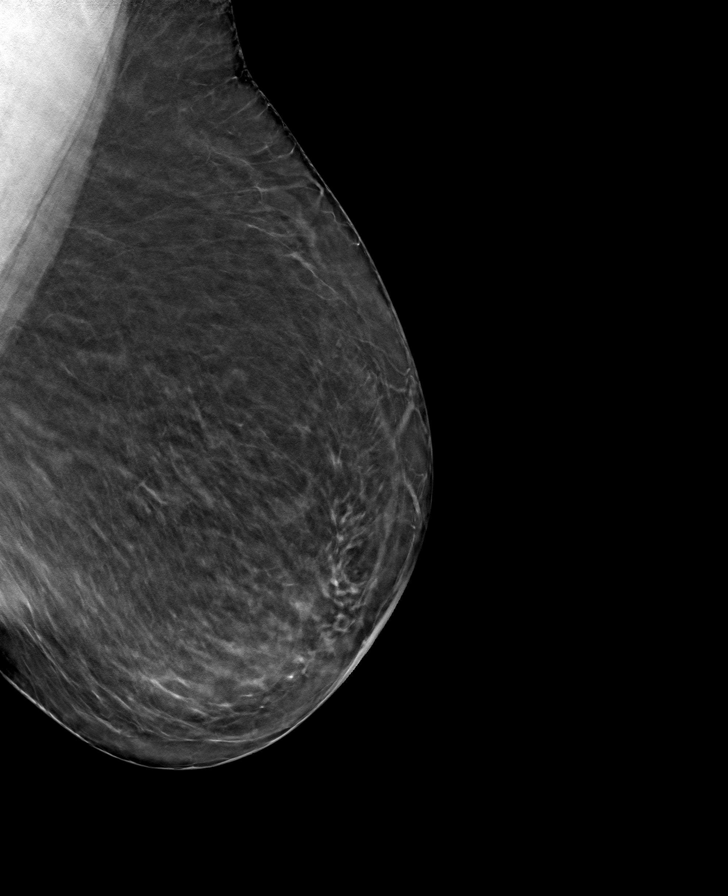

[L CC tomo · tomo slice 33/66.0]
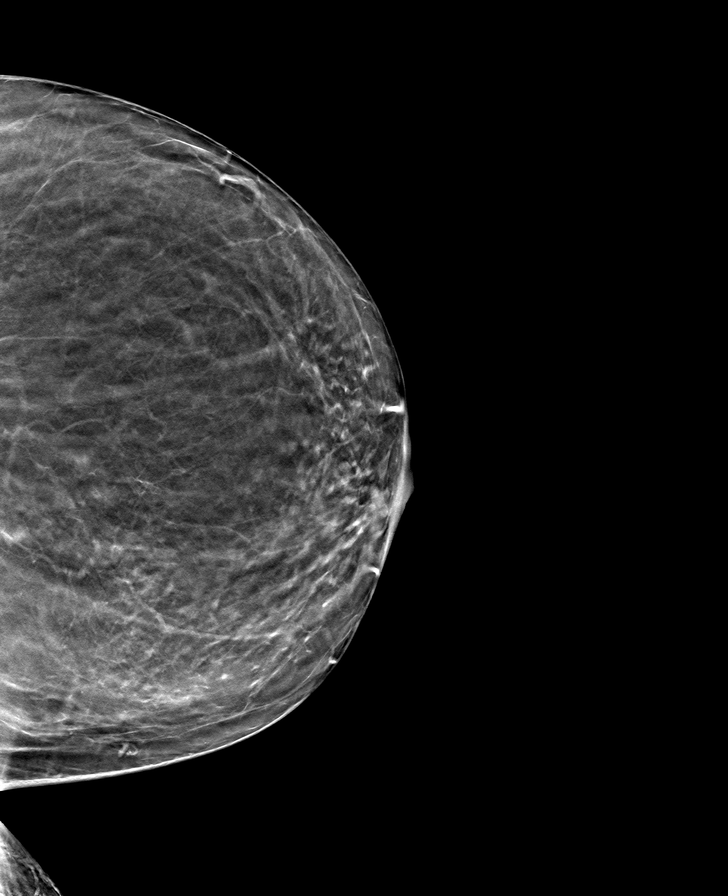

[R CC tomo · tomo slice 34/67.0]
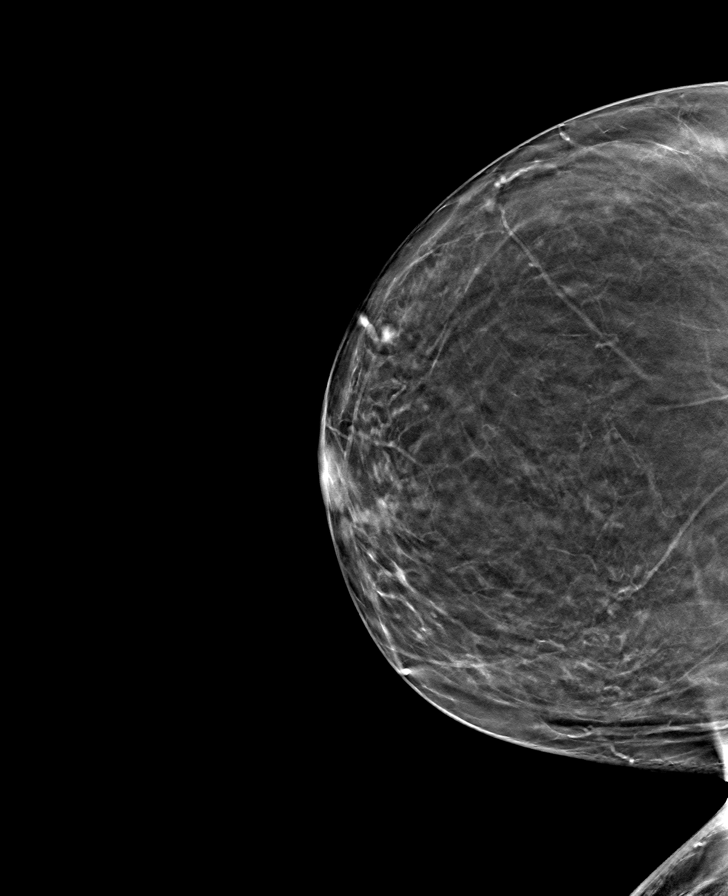

[8 of 24 positions shown; findings below may reference images not displayed]

ACR Breast Density Category b: There are scattered areas of
fibroglandular density.
FINDINGS: In the left breast, a possible asymmetry warrants further
evaluation. In the right breast, no findings suspicious for
malignancy. Images were processed with CAD.
IMPRESSION: Further evaluation is suggested for possible asymmetry in the left
breast.

RECOMMENDATION:
Diagnostic mammogram and possibly ultrasound of the left breast.
(Code:DI-5-LL4)

The patient will be contacted regarding the findings, and additional
imaging will be scheduled.

BI-RADS CATEGORY  0: Incomplete. Need additional imaging evaluation
and/or prior mammograms for comparison.

## 2020-08-27 ENCOUNTER — Encounter: Payer: Self-pay | Admitting: Podiatry

## 2020-08-27 ENCOUNTER — Ambulatory Visit (INDEPENDENT_AMBULATORY_CARE_PROVIDER_SITE_OTHER): Payer: 59

## 2020-08-27 ENCOUNTER — Other Ambulatory Visit: Payer: Self-pay

## 2020-08-27 ENCOUNTER — Ambulatory Visit (INDEPENDENT_AMBULATORY_CARE_PROVIDER_SITE_OTHER): Payer: 59 | Admitting: Podiatry

## 2020-08-27 DIAGNOSIS — M205X1 Other deformities of toe(s) (acquired), right foot: Secondary | ICD-10-CM

## 2020-08-27 DIAGNOSIS — S9030XA Contusion of unspecified foot, initial encounter: Secondary | ICD-10-CM | POA: Diagnosis not present

## 2020-09-18 DIAGNOSIS — M205X1 Other deformities of toe(s) (acquired), right foot: Secondary | ICD-10-CM | POA: Diagnosis not present

## 2020-09-18 MED ORDER — BETAMETHASONE SOD PHOS & ACET 6 (3-3) MG/ML IJ SUSP
3.0000 mg | Freq: Once | INTRAMUSCULAR | Status: AC
  Administered 2020-09-18: 3 mg via INTRA_ARTICULAR

## 2020-09-18 NOTE — Progress Notes (Signed)
   HPI: 55 y.o. female presenting today for evaluation of bilateral great toe pain is been going on for approximately 3 years.  Gradual onset.  She has been seeing Dr. Elijah Birk, local podiatrist and was given 2 cortisone shots in the right great toe.  She states that now her other toes flaring up.  She presents for further treatment and evaluation  Past Medical History:  Diagnosis Date  . Diabetes mellitus without complication (HCC)    borderline  . GERD (gastroesophageal reflux disease)   . Hypertension   . Obesity   . Vaginal delivery 1989, 1992     Physical Exam: General: The patient is alert and oriented x3 in no acute distress.  Dermatology: Skin is warm, dry and supple bilateral lower extremities. Negative for open lesions or macerations.  Vascular: Palpable pedal pulses bilaterally. No edema or erythema noted. Capillary refill within normal limits.  Neurological: Epicritic and protective threshold grossly intact bilaterally.   Musculoskeletal Exam: Range of motion within normal limits to all pedal and ankle joints bilateral with the exception of noted. Muscle strength 5/5 in all groups bilateral.  Pain on palpation and range of motion to the bilateral great toes with limited range of motion of the first MTPJ consistent with a hallux limitus  Radiographic Exam:  Normal osseous mineralization.  Degenerative changes with periarticular spurring noted to the bilateral first MTPJ's no fracture/dislocation/boney destruction.    Assessment: 1.  Hallux limitus bilateral   Plan of Care:  1. Patient evaluated. X-Rays reviewed.  2.  Injection of 0.5 cc Celestone Soluspan injection of the first MTPJ 3.  Prescription for meloxicam 15 mg daily 4.  Recommend good supportive shoes that help to support the foot and limit range of motion to the first MTPJ 5.  Return to clinic as needed      Felecia Shelling, DPM [facility]  Dr. Felecia Shelling, DPM    [address] [place], [place] [address]                Office [phone]  Fax [phone]

## 2020-10-07 ENCOUNTER — Other Ambulatory Visit: Payer: Self-pay | Admitting: Physician Assistant

## 2020-10-07 NOTE — Telephone Encounter (Signed)
Requesting refill on VIT D 50,000 units once a week. Do you want pt to continue this dose?

## 2020-10-07 NOTE — Telephone Encounter (Signed)
I do not recommend continuing this dose until we recheck labs

## 2020-11-03 ENCOUNTER — Emergency Department (HOSPITAL_COMMUNITY): Admission: EM | Admit: 2020-11-03 | Discharge: 2020-11-03 | Discharge status: Against Medical Advice | Payer: 59

## 2020-11-03 NOTE — ED Notes (Signed)
Pt did not want to wait.  

## 2020-11-19 ENCOUNTER — Other Ambulatory Visit: Payer: Self-pay | Admitting: Physician Assistant

## 2020-11-20 NOTE — Telephone Encounter (Signed)
  LAST APPOINTMENT DATE: 07/16/2020   NEXT APPOINTMENT DATE: 11/27/2020  MEDICATION: Lisinopril 20-25 MG   PHARMACY:[id number] - [place], [place] - [address] AT [facility] OF [address]  Comments: Patient is out, and states her blood pressure has been a little higher than usual. Does have a visit to discuss next week.   Please advise

## 2020-11-23 ENCOUNTER — Other Ambulatory Visit: Payer: Self-pay

## 2020-11-23 ENCOUNTER — Ambulatory Visit (HOSPITAL_COMMUNITY)
Admission: EM | Admit: 2020-11-23 | Discharge: 2020-11-23 | Disposition: A | Discharge status: Home / Self Care | Payer: 59 | Attending: Emergency Medicine | Admitting: Emergency Medicine

## 2020-11-23 ENCOUNTER — Encounter (HOSPITAL_COMMUNITY): Payer: Self-pay | Admitting: Emergency Medicine

## 2020-11-23 DIAGNOSIS — K120 Recurrent oral aphthae: Secondary | ICD-10-CM

## 2020-11-23 MED ORDER — TRIAMCINOLONE ACETONIDE 0.1 % MT PSTE: 1.0000 "application " | PASTE | Freq: Two times a day (BID) | OROMUCOSAL | 1 refills | Status: DC

## 2020-11-23 NOTE — ED Provider Notes (Signed)
[facility]    CSN: [id number] Arrival date & time: 11/23/20  1015      History   Chief Complaint Chief Complaint  Patient presents with   Mouth Lesions    HPI Natasha Doyle is a 55 y.o. female.   Patient here for evaluation of multiple mouth sores that have been ongoing for the past 4 days.  Reports having similar symptoms in the past that were relieved with triamcinolone paste but states that she has run out.  Reports trying to use Listerine and mouthwashes with minimal relief.  Denies any trauma, injury, or other precipitating event.  Denies any specific alleviating or aggravating factors.  Denies any fevers, chest pain, shortness of breath, N/V/D, numbness, tingling, weakness, abdominal pain, or headaches.    The history is provided by the patient.  Mouth Lesions  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   Gastroesophageal reflux disease 09/25/2019   Essential hypertension 09/28/2013    Past Surgical History:  Procedure Laterality Date   BREATH TEK H PYLORI N/A 10/22/2014   Procedure: BREATH TEK H PYLORI;  Surgeon: Gaynelle Adu, MD;  Location: [facility];  Service: General;  Laterality: N/A;   CHOLECYSTECTOMY     HERNIA REPAIR     HIATAL HERNIA REPAIR  01/04/2020   LAPAROSCOPIC GASTRIC SLEEVE RESECTION N/A 04/29/2015   Procedure: LAPAROSCOPIC GASTRIC SLEEVE RESECTION;  Surgeon: Geoffry Paradise, MD;  Location: [facility];  Service: General;  Laterality: N/A;    OB History     Gravida  2   Para  2   Term  2   Preterm  0   AB  0   Living  2      SAB  0   IAB  0   Ectopic  0   Multiple  0   Live Births  2            Home Medications     Prior to Admission medications   Medication Sig Start Date End Date Taking? Authorizing Provider  acetaminophen (TYLENOL) 325 MG tablet Take 2 tablets (650 mg total) by mouth every 6 (six) hours as needed for mild pain or moderate pain. 05/16/16   Everlene Farrier, PA-C  lisinopril (ZESTRIL) 10 MG tablet Take 1 tablet (10 mg total) by mouth daily. 07/16/20   Jarold Motto, PA  Multiple Vitamins-Calcium (ONE-A-DAY WOMENS PO) Take 1 tablet by mouth daily.    [provider]  ondansetron (ZOFRAN ODT) 4 MG disintegrating tablet Take 1 tablet (4 mg total) by mouth every 8 (eight) hours as needed for nausea or vomiting. 01/30/20   Jarold Motto, PA  pantoprazole (PROTONIX) 40 MG tablet Take 1 tablet (40 mg total) by mouth daily. 10/30/19   Jarold Motto, PA  triamcinolone (KENALOG) 0.1 % paste Use as directed 1 application in the mouth or throat 2 (two) times daily. 11/23/20   Ivette Loyal, NP  Vitamin D, Ergocalciferol, (DRISDOL) 1.25 MG (50000 UNIT) CAPS capsule Take 1 capsule (50,000 Units total) by mouth every 7 (seven) days. 07/16/20   Jarold Motto, PA    Family History Family History  Problem Relation Age of  Onset   Cancer Mother        breast   Hypertension Mother    Hyperlipidemia Mother    Alcohol abuse Father    Heart disease Brother    Hyperlipidemia Brother    Hypertension Brother     Social History Social History   Tobacco Use   Smoking status: Never   Smokeless tobacco: Never  Vaping Use   Vaping Use: Never used  Substance Use Topics   Alcohol use: Not Currently   Drug use: No     Allergies   Patient has no known allergies.   Review of Systems Review of Systems  HENT:  Positive for mouth sores.   All other systems reviewed and are negative.   Physical Exam Triage Vital Signs ED Triage Vitals  Enc Vitals Group     BP 11/23/20 1046 (!) 146/76     Pulse Rate 11/23/20 1046 65     Resp 11/23/20 1046 17     Temp 11/23/20 1046 98.1 F (36.7  C)     Temp Source 11/23/20 1046 Oral     SpO2 11/23/20 1046 100 %     Weight --      Height --      Head Circumference --      Peak Flow --      Pain Score 11/23/20 1045 5     Pain Loc --      Pain Edu? --      Excl. in GC? --    No data found.  Updated Vital Signs BP (!) 146/76 (BP Location: Right Arm)   Pulse 65   Temp 98.1 F (36.7 C) (Oral)   Resp 17   LMP 09/03/2012   SpO2 100%   Visual Acuity Right Eye Distance:   Left Eye Distance:   Bilateral Distance:    Right Eye Near:   Left Eye Near:    Bilateral Near:     Physical Exam Vitals and nursing note reviewed.  Constitutional:      General: She is not in acute distress.    Appearance: Normal appearance. She is not ill-appearing, toxic-appearing or diaphoretic.  HENT:     Head: Normocephalic and atraumatic.     Mouth/Throat:     Mouth: Oral lesions (on floor of mouth and left side of tongue.) present.  Eyes:     Conjunctiva/sclera: Conjunctivae normal.  Cardiovascular:     Rate and Rhythm: Normal rate.     Pulses: Normal pulses.  Pulmonary:     Effort: Pulmonary effort is normal.  Abdominal:     General: Abdomen is flat.  Musculoskeletal:        General: Normal range of motion.     Cervical back: Normal range of motion.  Skin:    General: Skin is warm and dry.  Neurological:     General: No focal deficit present.     Mental Status: She is alert and oriented to person, place, and time.  Psychiatric:        Mood and Affect: Mood normal.     UC Treatments / Results  Labs (all labs ordered are listed, but only abnormal results are displayed) Labs Reviewed - No data to display  EKG   Radiology No results found.  Procedures Procedures (including critical care time)  Medications Ordered in UC Medications - No data to display  Initial Impression / Assessment and Plan / UC Course  I have reviewed the triage vital signs and the  nursing notes.  Pertinent labs & imaging results that were  available during my care of the patient were reviewed by me and considered in my medical decision making (see chart for details).    Assessment negative labs or concerns.  Triamcinolone paste twice a day as needed for swelling.  Continue to use mouthwash and have good dental hygiene.  Avoid high acidity foods such as citrus fruits.  Follow-up with primary care as needed.  Final Clinical Impressions(s) / UC Diagnoses   Final diagnoses:  Canker sores oral     Discharge Instructions      Apply the tiamcinolone paste to the sores twice a day as needed.    Ensure you are doing good dental hygiene.  Avoid foods that have high acidity, such as citrus fruits.     Return or go to the Emergency Department if symptoms worsen or do not improve in the next few days.       ED Prescriptions     Medication Sig Dispense Auth. Provider   triamcinolone (KENALOG) 0.1 % paste Use as directed 1 application in the mouth or throat 2 (two) times daily. 5 g Ivette Loyal, NP      PDMP not reviewed this encounter.   Ivette Loyal, NP 11/23/20 1120

## 2020-11-23 NOTE — ED Triage Notes (Signed)
Pt presents with multiple mouth ulcers xs 4 days.

## 2020-11-23 NOTE — Discharge Instructions (Addendum)
Apply the tiamcinolone paste to the sores twice a day as needed.    Ensure you are doing good dental hygiene.  Avoid foods that have high acidity, such as citrus fruits.     Return or go to the Emergency Department if symptoms worsen or do not improve in the next few days.

## 2020-11-27 ENCOUNTER — Other Ambulatory Visit: Payer: Self-pay

## 2020-11-27 ENCOUNTER — Encounter: Payer: Self-pay | Admitting: Family

## 2020-11-27 ENCOUNTER — Ambulatory Visit (INDEPENDENT_AMBULATORY_CARE_PROVIDER_SITE_OTHER): Payer: 59 | Admitting: Family

## 2020-11-27 VITALS — BP 130/77 | HR 76 | Temp 97.9°F | Ht 64.0 in | Wt 250.0 lb

## 2020-11-27 DIAGNOSIS — I10 Essential (primary) hypertension: Secondary | ICD-10-CM

## 2020-11-27 MED ORDER — LISINOPRIL-HYDROCHLOROTHIAZIDE 10-12.5 MG PO TABS: 1.0000 | ORAL_TABLET | Freq: Every day | ORAL | 3 refills | Status: DC

## 2020-11-27 NOTE — Progress Notes (Signed)
Established Patient Office Visit  Subjective:  Patient ID: Natasha Doyle, female    DOB: 05/26/1966  Age: 55 y.o. MRN: [id number]  CC:  Chief Complaint  Patient presents with  . Hypertension  . Leg Swelling    Pt complains of ankle/leg swelling for 2 weeks. She denies pain unless walking or standing for long periods of time.    HPI Natasha Doyle presents with c/o lower extremity edema. She has a history of HTN and taking Lisinopril 10 mg once daily. She reports being on Lisinopril HCT in the past but came off of the medication when she lost weight. She she has since regained the weight and is having swelling again.  Patient reports that she is planning to have another weight loss surgery in a few weeks but is unsure of what the date will be.  She does not routinely exercise.  Past Medical History:  Diagnosis Date  . Diabetes mellitus without complication (HCC)    borderline  . GERD (gastroesophageal reflux disease)   . Hypertension   . Obesity   . Vaginal delivery 1989, 1992    Past Surgical History:  Procedure Laterality Date  . BREATH TEK H PYLORI N/A 10/22/2014   Procedure: BREATH TEK H PYLORI;  Surgeon: Gaynelle Adu, MD;  Location: [facility];  Service: General;  Laterality: N/A;  . CHOLECYSTECTOMY    . HERNIA REPAIR    . HIATAL HERNIA REPAIR  01/04/2020  . LAPAROSCOPIC GASTRIC SLEEVE RESECTION N/A 04/29/2015   Procedure: LAPAROSCOPIC GASTRIC SLEEVE RESECTION;  Surgeon: Geoffry Paradise, MD;  Location: [facility];  Service: General;  Laterality: N/A;    Family History  Problem Relation Age of Onset  . Cancer Mother        breast  . Hypertension Mother   . Hyperlipidemia Mother   . Alcohol abuse Father   . Heart disease Brother   . Hyperlipidemia Brother   . Hypertension Brother     Social History   Socioeconomic History  . Marital status: Single    Spouse name: Not on file  . Number of children: Not on file  . Years of education: Not on file  . Highest  education level: Not on file  Occupational History  . Not on file  Tobacco Use  . Smoking status: Never  . Smokeless tobacco: Never  Vaping Use  . Vaping Use: Never used  Substance and Sexual Activity  . Alcohol use: Not Currently  . Drug use: No  . Sexual activity: Yes    Birth control/protection: Post-menopausal  Other Topics Concern  . Not on file  Social History Narrative   Nursing Assistant   Social Determinants of Health   Financial Resource Strain: Not on file  Food Insecurity: Not on file  Transportation Needs: Not on file  Physical Activity: Not on file  Stress: Not on file  Social Connections: Not on file  Intimate Partner Violence: Not on file    Outpatient Medications Prior to Visit  Medication Sig Dispense Refill  . acetaminophen (TYLENOL) 325 MG tablet Take 2 tablets (650 mg total) by mouth every 6 (six) hours as needed for mild pain or moderate pain. 60 tablet 0  . Multiple Vitamins-Calcium (ONE-A-DAY WOMENS PO) Take 1 tablet by mouth daily.    . ondansetron (ZOFRAN ODT) 4 MG disintegrating tablet Take 1 tablet (4 mg total) by mouth every 8 (eight) hours as needed for nausea or vomiting. 20 tablet 0  . pantoprazole (PROTONIX) 40  MG tablet Take 1 tablet (40 mg total) by mouth daily. 30 tablet 3  . triamcinolone (KENALOG) 0.1 % paste Use as directed 1 application in the mouth or throat 2 (two) times daily. 5 g 1  . Vitamin D, Ergocalciferol, (DRISDOL) 1.25 MG (50000 UNIT) CAPS capsule Take 1 capsule (50,000 Units total) by mouth every 7 (seven) days. 12 capsule 0  . lisinopril (ZESTRIL) 10 MG tablet Take 1 tablet (10 mg total) by mouth daily. 90 tablet 3   No facility-administered medications prior to visit.    No Known Allergies  ROS Review of Systems  Constitutional: Negative.   Cardiovascular:  Positive for leg swelling. Negative for chest pain and palpitations.  Musculoskeletal:        Feet pain when walking extended distances  Skin: Negative.    Psychiatric/Behavioral: Negative.    All other systems reviewed and are negative.    Objective:    Physical Exam Vitals and nursing note reviewed.  Constitutional:      Appearance: Normal appearance. She is obese.  Cardiovascular:     Rate and Rhythm: Normal rate and regular rhythm.  Pulmonary:     Effort: Pulmonary effort is normal.     Breath sounds: Normal breath sounds.  Musculoskeletal:        General: Swelling present. No tenderness.     Cervical back: Normal range of motion and neck supple.     Right lower leg: Edema present.     Left lower leg: Edema present.     Comments: 2+ pitting edema bilaterally  Skin:    General: Skin is warm and dry.  Neurological:     General: No focal deficit present.     Mental Status: She is alert and oriented to person, place, and time.  Psychiatric:        Mood and Affect: Mood normal.        Behavior: Behavior normal.   BP 130/77   Pulse 76   Temp 97.9 F (36.6 C) (Temporal)   Ht 5\' 4"  (1.626 m)   Wt 250 lb (113.4 kg)   LMP 09/03/2012   SpO2 100%   BMI 42.91 kg/m  Wt Readings from Last 3 Encounters:  11/27/20 250 lb (113.4 kg)  07/16/20 246 lb 6.1 oz (111.8 kg)  04/29/20 233 lb (105.7 kg)     Health Maintenance Due  Topic Date Due  . Hepatitis C Screening  Never done  . COLONOSCOPY (Pts 45-39yrs Insurance coverage will need to be confirmed)  Never done  . Zoster Vaccines- Shingrix (1 of 2) Never done  . PAP SMEAR-Modifier  09/28/2016  . COVID-19 Vaccine (3 - Booster for Pfizer series) 05/04/2020    There are no preventive care reminders to display for this patient.  Lab Results  Component Value Date   TSH 1.175 04/20/2010   Lab Results  Component Value Date   WBC 5.2 07/16/2020   HGB 10.6 (L) 07/16/2020   HCT 31.9 (L) 07/16/2020   MCV 80.4 07/16/2020   PLT 265.0 07/16/2020   Lab Results  Component Value Date   NA 138 07/16/2020   K 3.9 07/16/2020   CO2 28 07/16/2020   GLUCOSE 96 07/16/2020   BUN 14  07/16/2020   CREATININE 0.76 07/16/2020   BILITOT 0.7 07/16/2020   ALKPHOS 60 07/16/2020   AST 14 07/16/2020   ALT 11 07/16/2020   PROT 6.9 07/16/2020   ALBUMIN 3.6 07/16/2020   CALCIUM 9.2 07/16/2020   ANIONGAP 9  02/18/2018   GFR 88.21 07/16/2020   No results found for: CHOL No results found for: HDL No results found for: LDLCALC No results found for: TRIG No results found for: CHOLHDL Lab Results  Component Value Date   HGBA1C 5.7 (A) 07/16/2020      Assessment & Plan:   Problem List Items Addressed This Visit     Essential hypertension - Primary   Relevant Medications   lisinopril-hydrochlorothiazide (ZESTORETIC) 10-12.5 MG tablet   Other Visit Diagnoses     Morbid obesity (HCC)           Meds ordered this encounter  Medications  . lisinopril-hydrochlorothiazide (ZESTORETIC) 10-12.5 MG tablet    Sig: Take 1 tablet by mouth daily.    Dispense:  30 tablet    Refill:  3    Follow-up: Return in about 3 weeks (around 12/18/2020).  Patient will send a list of her blood pressure readings at that time.  We will start lisinopril HCT 10/12.5 once daily.  We may need to make further adjustments.  However, he have to be very careful as she is planning to undergo weight loss surgery again in titration may be necessary.  Encouraged healthy diet, exercise, low-sodium   Eulis Foster, FNP

## 2021-01-14 ENCOUNTER — Encounter (HOSPITAL_COMMUNITY): Payer: Self-pay | Admitting: *Deleted

## 2021-01-14 ENCOUNTER — Ambulatory Visit (HOSPITAL_COMMUNITY)
Admission: EM | Admit: 2021-01-14 | Discharge: 2021-01-14 | Disposition: A | Discharge status: Home / Self Care | Payer: 59 | Attending: Physician Assistant | Admitting: Physician Assistant

## 2021-01-14 ENCOUNTER — Other Ambulatory Visit: Payer: Self-pay

## 2021-01-14 DIAGNOSIS — M7989 Other specified soft tissue disorders: Secondary | ICD-10-CM

## 2021-01-14 DIAGNOSIS — I10 Essential (primary) hypertension: Secondary | ICD-10-CM

## 2021-01-14 MED ORDER — KETOROLAC TROMETHAMINE 30 MG/ML IJ SOLN
30.0000 mg | Freq: Once | INTRAMUSCULAR | Status: AC
Start: 2021-01-14 — End: 2021-01-14
  Administered 2021-01-14: 30 mg via INTRAMUSCULAR

## 2021-01-14 MED ORDER — KETOROLAC TROMETHAMINE 30 MG/ML IJ SOLN
INTRAMUSCULAR | Status: AC
  Filled 2021-01-14: qty 1

## 2021-01-14 NOTE — ED Provider Notes (Signed)
[facility]    CSN: [id number] Arrival date & time: 01/14/21  1531      History   Chief Complaint Chief Complaint  Patient presents with   Headache   Leg Swelling    HPI Natasha Doyle is a 55 y.o. female.   Pt complains of chronic bilateral lower extremity edema that became worse over the last few days.  She also reports headache that started several days ago.  Denies chest pain, shortness of breath, palpitations, visual disturbances, calf tenderness.  She was seen by PCP recently for similar sx.  Lisonopril-hydrochlorothiazide started.  Pt advised to keep a log of home pressures and return for f/u, consider titration of medication if needed. She reports being under more stress than normal at work. She has taken tylenol with some improvement of headache.    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   Gastroesophageal reflux disease 09/25/2019   Essential hypertension 09/28/2013    Past Surgical History:  Procedure Laterality Date   BREATH TEK H PYLORI N/A 10/22/2014   Procedure: BREATH TEK H PYLORI;  Surgeon: Gaynelle Adu, MD;  Location: [facility];  Service: General;  Laterality: N/A;   CHOLECYSTECTOMY     HERNIA REPAIR     HIATAL HERNIA REPAIR  01/04/2020   LAPAROSCOPIC GASTRIC SLEEVE RESECTION N/A 04/29/2015   Procedure: LAPAROSCOPIC GASTRIC SLEEVE RESECTION;  Surgeon: Geoffry Paradise, MD;  Location: [facility];  Service: General;  Laterality: N/A;    OB History     Gravida  2   Para  2   Term  2   Preterm  0   AB  0   Living  2      SAB  0   IAB  0   Ectopic  0   Multiple  0   Live Births  2            Home Medications    Prior to  Admission medications   Medication Sig Start Date End Date Taking? Authorizing Provider  acetaminophen (TYLENOL) 325 MG tablet Take 2 tablets (650 mg total) by mouth every 6 (six) hours as needed for mild pain or moderate pain. 05/16/16  Yes Everlene Farrier, PA-C  lisinopril-hydrochlorothiazide (ZESTORETIC) 10-12.5 MG tablet Take 1 tablet by mouth daily. 11/27/20  Yes Worthy Rancher B, FNP  Multiple Vitamins-Calcium (ONE-A-DAY WOMENS PO) Take 1 tablet by mouth daily.   Yes [provider]  pantoprazole (PROTONIX) 40 MG tablet Take 1 tablet (40 mg total) by mouth daily. 10/30/19  Yes Jarold Motto, PA  triamcinolone (KENALOG) 0.1 % paste Use as directed 1 application in the mouth or throat 2 (two) times daily. 11/23/20  Yes Ivette Loyal, NP  Vitamin D, Ergocalciferol, (DRISDOL) 1.25 MG (50000 UNIT) CAPS capsule Take 1 capsule (50,000 Units total) by mouth every 7 (seven) days. 07/16/20  Yes Jarold Motto, PA  ondansetron (ZOFRAN ODT) 4 MG disintegrating tablet Take 1 tablet (4 mg total) by mouth every 8 (eight) hours as needed for nausea or vomiting. 01/30/20   Jarold Motto, PA    Family History Family History  Problem Relation Age of Onset  Cancer Mother        breast   Hypertension Mother    Hyperlipidemia Mother    Alcohol abuse Father    Heart disease Brother    Hyperlipidemia Brother    Hypertension Brother     Social History Social History   Tobacco Use   Smoking status: Never   Smokeless tobacco: Never  Vaping Use   Vaping Use: Never used  Substance Use Topics   Alcohol use: Not Currently   Drug use: No     Allergies   Patient has no known allergies.   Review of Systems Review of Systems  Constitutional:  Negative for chills and fever.  HENT:  Negative for ear pain and sore throat.   Eyes:  Negative for pain and visual disturbance.  Respiratory:  Negative for cough and shortness of breath.   Cardiovascular:  Positive for leg swelling. Negative for  chest pain and palpitations.  Gastrointestinal:  Negative for abdominal pain and vomiting.  Genitourinary:  Negative for dysuria and hematuria.  Musculoskeletal:  Negative for arthralgias and back pain.  Skin:  Negative for color change and rash.  Neurological:  Positive for headaches. Negative for dizziness, seizures, syncope and light-headedness.  All other systems reviewed and are negative.   Physical Exam Triage Vital Signs ED Triage Vitals [01/14/21 1702]  Enc Vitals Group     BP (!) 152/85     Pulse Rate 68     Resp 20     Temp 98.5 F (36.9 C)     Temp src      SpO2 (!) 10 %     Weight      Height      Head Circumference      Peak Flow      Pain Score 5     Pain Loc      Pain Edu?      Excl. in GC?    No data found.  Updated Vital Signs BP (!) 152/85   Pulse 68   Temp 98.5 F (36.9 C)   Resp 20   LMP 09/03/2012   SpO2 (!) 10%   Visual Acuity Right Eye Distance:   Left Eye Distance:   Bilateral Distance:    Right Eye Near:   Left Eye Near:    Bilateral Near:     Physical Exam Vitals and nursing note reviewed.  Constitutional:      General: She is not in acute distress.    Appearance: She is well-developed.  HENT:     Head: Normocephalic and atraumatic.  Eyes:     Conjunctiva/sclera: Conjunctivae normal.  Cardiovascular:     Rate and Rhythm: Normal rate and regular rhythm.     Heart sounds: No murmur heard. Pulmonary:     Effort: Pulmonary effort is normal. No respiratory distress.     Breath sounds: Normal breath sounds.  Abdominal:     Palpations: Abdomen is soft.     Tenderness: There is no abdominal tenderness.  Musculoskeletal:     Cervical back: Neck supple.     Right lower leg: 1+ Edema present.     Left lower leg: 1+ Edema present.  Skin:    General: Skin is warm and dry.  Neurological:     Mental Status: She is alert.     UC Treatments / Results  Labs (all labs ordered are listed, but only abnormal results are  displayed) Labs Reviewed - No data to display  EKG  Radiology No results found.  Procedures Procedures (including critical care time)  Medications Ordered in UC Medications  ketorolac (TORADOL) 30 MG/ML injection 30 mg (has no administration in time range)    Initial Impression / Assessment and Plan / UC Course  I have reviewed the triage vital signs and the nursing notes.  Pertinent labs & imaging results that were available during my care of the patient were reviewed by me and considered in my medical decision making (see chart for details).    Pt is followed by PCP for HTN, was recently seen with lower extremity edema and Lisinopril-hydrochlorothiazide was started.  She was advised to follow up to determine if medication needs to be titrated.  I have advised pt to call PCP in the morning to discuss titration and to review home BP readings.  No concern for DVT, chronic bilateral LE edema, no calf tenderness, denies shortness of breath. Most recent labs reviewed from 7/19 with no abnormalities. Stable for discharge.  Final Clinical Impressions(s) / UC Diagnoses   Final diagnoses:  None   Discharge Instructions   None    ED Prescriptions   None    PDMP not reviewed this encounter.   Jodell Cipro, PA-C 01/14/21 1829

## 2021-01-14 NOTE — Discharge Instructions (Addendum)
Call primary care in the morning to discuss titration of blood pressure medication.  Take tylenol as needed for headache Elevate legs, reduce salt, drink plenty of fluids.  Continue to check blood pressure at home

## 2021-04-10 ENCOUNTER — Ambulatory Visit (HOSPITAL_COMMUNITY)
Admission: EM | Admit: 2021-04-10 | Discharge: 2021-04-10 | Disposition: A | Discharge status: Home / Self Care | Payer: 59 | Attending: Emergency Medicine | Admitting: Emergency Medicine

## 2021-04-10 ENCOUNTER — Other Ambulatory Visit: Payer: Self-pay

## 2021-04-10 ENCOUNTER — Encounter (HOSPITAL_COMMUNITY): Payer: Self-pay | Admitting: Emergency Medicine

## 2021-04-10 DIAGNOSIS — R102 Pelvic and perineal pain: Secondary | ICD-10-CM | POA: Insufficient documentation

## 2021-04-10 LAB — POCT URINALYSIS DIPSTICK, ED / UC
Glucose, UA: NEGATIVE mg/dL
Hgb urine dipstick: NEGATIVE
Leukocytes,Ua: NEGATIVE
Nitrite: NEGATIVE
Protein, ur: 30 mg/dL — AB
Specific Gravity, Urine: >=1.03 (ref 1.005–1.030)
Urobilinogen, UA: 1 mg/dL (ref 0.0–1.0)
pH: 6 (ref 5.0–8.0)

## 2021-04-10 MED ORDER — METRONIDAZOLE 500 MG PO TABS: 500.0000 mg | ORAL_TABLET | Freq: Two times a day (BID) | ORAL | 0 refills | Status: AC

## 2021-04-10 MED ORDER — IBUPROFEN 600 MG PO TABS: 600.0000 mg | ORAL_TABLET | Freq: Four times a day (QID) | ORAL | 0 refills | Status: DC | PRN

## 2021-04-10 NOTE — ED Provider Notes (Signed)
HPI  SUBJECTIVE:  Natasha Doyle is a 55 y.o. female who presents with 3 days of intermittent, sharp, minutes long midline lower abdominal/pelvic pain.  She reports intermittent bilateral low back pain with the pelvic pain.  She has had no back pain today.  No nausea, vomiting, fevers, urinary complaints, vaginal odor, bleeding, discharge.  States that she has not been sexually active in a year and a half.  No antibiotics in the past month.  No Antipyretic in the past 6 hours.  No trauma to the area.  She has never had symptoms like this before.  She tried Tylenol with improvement in her symptoms.  No aggravating factors.  Is not associated with standing up, urination, defecation.  She has a past medical history of hypertension, UTI, nephrolithiasis.  No history of diabetes, pyelonephritis, PID, STDs, BV, yeast.  NWG:NFAOZH, Lelon Mast, PA   Past Medical History:  Diagnosis Date   Diabetes mellitus without complication (HCC)    borderline   GERD (gastroesophageal reflux disease)    Hypertension    Obesity    Vaginal delivery 1989, 1992    Past Surgical History:  Procedure Laterality Date   BREATH TEK H PYLORI N/A 10/22/2014   Procedure: BREATH TEK H PYLORI;  Surgeon: Gaynelle Adu, MD;  Location: [facility];  Service: General;  Laterality: N/A;   CHOLECYSTECTOMY     HERNIA REPAIR     HIATAL HERNIA REPAIR  01/04/2020   LAPAROSCOPIC GASTRIC SLEEVE RESECTION N/A 04/29/2015   Procedure: LAPAROSCOPIC GASTRIC SLEEVE RESECTION;  Surgeon: Geoffry Paradise, MD;  Location: [facility];  Service: General;  Laterality: N/A;    Family History  Problem Relation Age of Onset   Cancer Mother        breast   Hypertension Mother    Hyperlipidemia Mother    Alcohol abuse Father    Heart disease Brother    Hyperlipidemia Brother    Hypertension Brother     Social History   Tobacco Use   Smoking status: Never   Smokeless tobacco: Never  Vaping Use   Vaping Use: Never used  Substance Use Topics    Alcohol use: Not Currently   Drug use: No    No current facility-administered medications for this encounter.  Current Outpatient Medications:    ibuprofen (ADVIL) 600 MG tablet, Take 1 tablet (600 mg total) by mouth every 6 (six) hours as needed., Disp: 30 tablet, Rfl: 0   metroNIDAZOLE (FLAGYL) 500 MG tablet, Take 1 tablet (500 mg total) by mouth 2 (two) times daily for 7 days., Disp: 14 tablet, Rfl: 0   lisinopril-hydrochlorothiazide (ZESTORETIC) 10-12.5 MG tablet, Take 1 tablet by mouth daily., Disp: 30 tablet, Rfl: 3   Multiple Vitamins-Calcium (ONE-A-DAY WOMENS PO), Take 1 tablet by mouth daily., Disp: , Rfl:    ondansetron (ZOFRAN ODT) 4 MG disintegrating tablet, Take 1 tablet (4 mg total) by mouth every 8 (eight) hours as needed for nausea or vomiting., Disp: 20 tablet, Rfl: 0   pantoprazole (PROTONIX) 40 MG tablet, Take 1 tablet (40 mg total) by mouth daily., Disp: 30 tablet, Rfl: 3   triamcinolone (KENALOG) 0.1 % paste, Use as directed 1 application in the mouth or throat 2 (two) times daily., Disp: 5 g, Rfl: 1   Vitamin D, Ergocalciferol, (DRISDOL) 1.25 MG (50000 UNIT) CAPS capsule, Take 1 capsule (50,000 Units total) by mouth every 7 (seven) days., Disp: 12 capsule, Rfl: 0  No Known Allergies   ROS  As noted in HPI.  Physical Exam  BP 130/78 (BP Location: Left Arm)   Pulse 75   Temp 99 F (37.2 C) (Oral)   Resp 17   LMP 09/03/2012   SpO2 98%   Constitutional: Well developed, well nourished, no acute distress Eyes:  EOMI, conjunctiva normal bilaterally HENT: Normocephalic, atraumatic,mucus membranes moist Respiratory: Normal inspiratory effort Cardiovascular: Normal rate GI: nondistended soft, nontender. Positive suprapubic tenderness.  No flank tenderness. back: No CVA tenderness GU: External genitalia normal.  Normal vaginal mucosa.  Normal os. Thin nonoderous  white vaginal discharge.   Uterus smooth, NT. No CMT. No adnexal tenderness. No adnexal masses.   Chaperone present during exam skin: No rash, skin intact Musculoskeletal: no deformities Neurologic: Alert & oriented x 3, no focal neuro deficits Psychiatric: Speech and behavior appropriate   ED Course   Medications - No data to display  Orders Placed This Encounter  Procedures   POC Urinalysis dipstick    Standing Status:   Standing    Number of Occurrences:   1    Results for orders placed or performed during the hospital encounter of 04/10/21 (from the past 24 hour(s))  POC Urinalysis dipstick     Status: Abnormal   Collection Time: 04/10/21  9:47 AM  Result Value Ref Range   Glucose, UA NEGATIVE NEGATIVE mg/dL   Bilirubin Urine MODERATE (A) NEGATIVE   Ketones, ur TRACE (A) NEGATIVE mg/dL   Specific Gravity, Urine >=1.030 1.005 - 1.030   Hgb urine dipstick NEGATIVE NEGATIVE   pH 6.0 5.0 - 8.0   Protein, ur 30 (A) NEGATIVE mg/dL   Urobilinogen, UA 1.0 0.0 - 1.0 mg/dL   Nitrite NEGATIVE NEGATIVE   Leukocytes,Ua NEGATIVE NEGATIVE   No results found.  ED Clinical Impression  1. Pelvic pain in female     ED Assessment/Plan  Will check UA, vaginal swab for BV and yeast.  Feel that she is very low risk for STDs as she has not been sexually active in 1-1/2 years.  Will not test for STDs.  There is no evidence of PID.  Doubt TOA, torsion, ovarian cysts, colitis, obstructing nephrolithiasis or other surgical emergency.  Urine dip negative for UTI.  Suspect BV.  Home with Flagyl, Tylenol/ibuprofen.   Follow-up with PMD as needed. Discussed labs, MDM, plan and followup with patient. Pt agrees with plan.   Meds ordered this encounter  Medications   ibuprofen (ADVIL) 600 MG tablet    Sig: Take 1 tablet (600 mg total) by mouth every 6 (six) hours as needed.    Dispense:  30 tablet    Refill:  0   metroNIDAZOLE (FLAGYL) 500 MG tablet    Sig: Take 1 tablet (500 mg total) by mouth 2 (two) times daily for 7 days.    Dispense:  14 tablet    Refill:  0    *This clinic  note was created using Scientist, clinical (histocompatibility and immunogenetics). Therefore, there may be occasional mistakes despite careful proofreading.  ?     Domenick Gong, MD 04/11/21 [phone]

## 2021-04-10 NOTE — Discharge Instructions (Addendum)
Take 600 mg of ibuprofen combined with 1000 mg of Tylenol together 3-4 times a day as needed for pain. Your urinalysis was negative for UTI or for blood.  Make sure you drink plenty of extra fluids.  I am going to treat you presumptively for bacterial vaginosis.  This lab will come back in several days.  Discontinue the Flagyl if it is negative for BV.  Follow-up with your doctor if not getting any better in several days, go to the ED if he gets worse, for the signs and symptoms we discussed

## 2021-04-10 NOTE — ED Triage Notes (Signed)
C/o pelvic pain for 3 days. Denies discharge, odor, blood in urine.

## 2021-04-13 LAB — CERVICOVAGINAL ANCILLARY ONLY
Bacterial Vaginitis (gardnerella): POSITIVE — AB
Candida Glabrata: POSITIVE — AB
Candida Vaginitis: NEGATIVE
Comment: NEGATIVE
Comment: NEGATIVE
Comment: NEGATIVE

## 2021-04-14 ENCOUNTER — Telehealth (HOSPITAL_COMMUNITY): Payer: Self-pay | Admitting: Emergency Medicine

## 2021-04-14 MED ORDER — FLUCONAZOLE 150 MG PO TABS: 150.0000 mg | ORAL_TABLET | Freq: Once | ORAL | 0 refills | Status: AC

## 2021-04-15 ENCOUNTER — Telehealth (HOSPITAL_COMMUNITY): Payer: Self-pay | Admitting: Emergency Medicine

## 2021-04-15 ENCOUNTER — Telehealth: Payer: Self-pay

## 2021-04-15 MED ORDER — FLUCONAZOLE 150 MG PO TABS: 150.0000 mg | ORAL_TABLET | Freq: Once | ORAL | 0 refills | Status: AC

## 2021-04-15 NOTE — Telephone Encounter (Signed)
Patient has an appointment tomorrow.   Nurse Assessment Nurse: Elijah Birk, RN, Vernona Rieger Date/Time Lamount Cohen Time): 04/14/2021 4:21:30 PM Confirm and document reason for call. If symptomatic, describe symptoms. ---Caller states she is having severe abdominal pain on right side right at the top near vagina, she feels like she needs to use restroom and when she stands it gets worse. Was given Flagyl Friday in case there was some type of infection. Does the patient have any new or worsening symptoms? ---Yes Will a triage be completed? ---Yes Related visit to physician within the last 2 weeks? ---Yes Does the PT have any chronic conditions? (i.e. diabetes, asthma, this includes High risk factors for pregnancy, etc.) ---Yes List chronic conditions. ---Had bariatric surgery. Hernia. Is this a behavioral health or substance abuse call? ---No PLEASE NOTE: All timestamps contained within this report are represented as [place] Standard Time. CONFIDENTIALTY NOTICE: This fax transmission is intended only for the addressee. It contains information that is legally privileged, confidential or otherwise protected from use or disclosure. If you are not the intended recipient, you are strictly prohibited from reviewing, disclosing, copying using or disseminating any of this information or taking any action in reliance on or regarding this information. If you have received this fax in error, please notify us immediately by telephone so that we can arrange for its return to [place]. Phone: [phone], Toll-Free: [phone], Fax: [phone] Page: 2 of 2 Call Id: [id number] Guidelines Guideline Title Affirmed Question Affirmed Notes Nurse Date/Time Lamount Cohen Time) Pelvic Pain - Female [1] MILD (e.g., does not interfere with normal activities) pelvic pain AND [2] pain comes and goes (cramps) AND [3] present > 48 hours Mallie Darting 04/14/2021 4:27:33 PM Disp. Time Lamount Cohen Time) Disposition Final  User 04/14/2021 4:16:40 PM Send to Urgent Sheliah Mends 04/14/2021 4:33:57 PM See PCP within 24 Hours Yes Elijah Birk, RN, Durenda Guthrie Disagree/Comply Disagree Caller Understands Yes PreDisposition Call Doctor Care Advice Given Per Guideline SEE PCP WITHIN 24 HOURS: HEAT: * Apply a heating pad or warm washcloth to the lower abdomen for 20 minutes twice a day to help reduce pain. * You can also try sitting in a tub filled with warm water. CALL BACK IF: * Severe pain lasts over 1 hour * Constant pain lasts over 2 hours * You become worse CARE ADVICE given per Pelvic Pain, Female (Adult) guideline. Comments User: Beverley Fiedler, RN Date/Time Lamount Cohen Time): 04/14/2021 4:34:30 PM Pt has an appt for Thursday with her PCP. Referrals GO TO FACILITY REFUSE

## 2021-04-15 NOTE — Telephone Encounter (Signed)
Patient requested meds be sent to [facility] instead of [facility]

## 2021-04-15 NOTE — Telephone Encounter (Signed)
See Triage note. Pt scheduled tomorrow.

## 2021-04-16 ENCOUNTER — Ambulatory Visit: Payer: 59 | Admitting: Physician Assistant

## 2021-04-20 ENCOUNTER — Other Ambulatory Visit: Payer: Self-pay

## 2021-04-20 ENCOUNTER — Encounter: Payer: Self-pay | Admitting: Physician Assistant

## 2021-04-20 ENCOUNTER — Ambulatory Visit (INDEPENDENT_AMBULATORY_CARE_PROVIDER_SITE_OTHER): Payer: 59 | Admitting: Physician Assistant

## 2021-04-20 VITALS — BP 130/72 | HR 77 | Temp 98.0°F | Ht 64.0 in | Wt 222.0 lb

## 2021-04-20 DIAGNOSIS — R197 Diarrhea, unspecified: Secondary | ICD-10-CM

## 2021-04-20 DIAGNOSIS — I10 Essential (primary) hypertension: Secondary | ICD-10-CM | POA: Diagnosis not present

## 2021-04-20 LAB — CBC WITH DIFFERENTIAL/PLATELET
Basophils Absolute: 0 10*3/uL (ref 0.0–0.1)
Basophils Relative: 0.7 % (ref 0.0–3.0)
Eosinophils Absolute: 0.2 10*3/uL (ref 0.0–0.7)
Eosinophils Relative: 3.6 % (ref 0.0–5.0)
HCT: 30.3 % — ABNORMAL LOW (ref 36.0–46.0)
Hemoglobin: 10.2 g/dL — ABNORMAL LOW (ref 12.0–15.0)
Lymphocytes Relative: 32.9 % (ref 12.0–46.0)
Lymphs Abs: 2.1 10*3/uL (ref 0.7–4.0)
MCHC: 33.6 g/dL (ref 30.0–36.0)
MCV: 80.5 fl (ref 78.0–100.0)
Monocytes Absolute: 0.7 10*3/uL (ref 0.1–1.0)
Monocytes Relative: 10.5 % (ref 3.0–12.0)
Neutro Abs: 3.4 10*3/uL (ref 1.4–7.7)
Neutrophils Relative %: 52.3 % (ref 43.0–77.0)
Platelets: 358 10*3/uL (ref 150.0–400.0)
RBC: 3.76 Mil/uL — ABNORMAL LOW (ref 3.87–5.11)
RDW: 15.3 % (ref 11.5–15.5)
WBC: 6.5 10*3/uL (ref 4.0–10.5)

## 2021-04-20 LAB — TSH: TSH: 1.66 u[IU]/mL (ref 0.35–5.50)

## 2021-04-20 LAB — LIPASE: Lipase: 11 U/L (ref 11.0–59.0)

## 2021-04-20 LAB — COMPREHENSIVE METABOLIC PANEL
ALT: 17 U/L (ref 0–35)
AST: 16 U/L (ref 0–37)
Albumin: 3.5 g/dL (ref 3.5–5.2)
Alkaline Phosphatase: 65 U/L (ref 39–117)
BUN: 11 mg/dL (ref 6–23)
CO2: 25 mEq/L (ref 19–32)
Calcium: 8.8 mg/dL (ref 8.4–10.5)
Chloride: 108 mEq/L (ref 96–112)
Creatinine, Ser: 0.7 mg/dL (ref 0.40–1.20)
GFR: 96.84 mL/min (ref >60.00)
Glucose, Bld: 98 mg/dL (ref 70–99)
Potassium: 3.6 mEq/L (ref 3.5–5.1)
Sodium: 140 mEq/L (ref 135–145)
Total Bilirubin: 0.6 mg/dL (ref 0.2–1.2)
Total Protein: 6.7 g/dL (ref 6.0–8.3)

## 2021-04-20 NOTE — Progress Notes (Signed)
Natasha Doyle is a 55 y.o. female here for a follow up of hypertension.   History of Present Illness:   Chief Complaint  Patient presents with   Hypertension    HPI  Hypertension Currently non- compliant with taking zestoretic 12.5 mg due to recent diarrhea episodes and weight loss --Per chart review it looks like she was told to discontinue this after her surgery from her surgical team.  Patient denies chest pain, SOB, blurred vision, dizziness, unusual headaches. Denies excessive caffeine intake, stimulant usage, excessive alcohol intake, or increase in salt consumption.  BP Readings from Last 3 Encounters:  04/20/21 130/72  04/10/21 130/78  01/14/21 (!) 152/85   Diarrhea Natasha Doyle expresses she has been experiencing constant diarrhea since 04/16/21. She states that she hasn't been able to eat anything without stomach upset. According to Natasha Doyle she believes her stools are foul smelling, changed from baseline. Denies feelings of lightheadedness following a meal, hematochezia, or nausea.    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 Surgery was completed with no complications. Shortly after Natasha Doyle visited the [facility] on 04/10/21 with c/o pelvic pain that was found to be caused by BV and a yeast infection via vaginal swab.    At that time she was prescribed flagyl 500 mg BID for 10 days and diflucan 150 mg daily. Pt was compliant with this medication and didn't believe recent use was related to recent diarrhea prior to our visit today.  Admits she has had two episodes of bowel urgency as well as occasional lightheadedness. Natasha Doyle did take imodium once which provided temporary relief but hasn't since due to recent surgery. Currently compliant with taking bariatric advantage chewables daily.     Past Medical History:  Diagnosis Date   Diabetes mellitus without complication (HCC)    borderline   GERD  (gastroesophageal reflux disease)    Hypertension    Obesity    Vaginal delivery 1989, 1992     Social History   Tobacco Use   Smoking status: Never   Smokeless tobacco: Never  Vaping Use   Vaping Use: Never used  Substance Use Topics   Alcohol use: Not Currently   Drug use: No    Past Surgical History:  Procedure Laterality Date   BREATH TEK H PYLORI N/A 10/22/2014   Procedure: BREATH TEK H PYLORI;  Surgeon: Greer Pickerel, MD;  Location: [facility];  Service: General;  Laterality: N/A;   CHOLECYSTECTOMY     HERNIA REPAIR     HIATAL HERNIA REPAIR  01/04/2020   LAPAROSCOPIC GASTRIC SLEEVE RESECTION N/A 04/29/2015   Procedure: LAPAROSCOPIC GASTRIC SLEEVE RESECTION;  Surgeon: Bonner Puna, MD;  Location: [facility];  Service: General;  Laterality: N/A;    Family History  Problem Relation Age of Onset   Cancer Mother        breast   Hypertension Mother    Hyperlipidemia Mother    Alcohol abuse Father    Heart disease Brother    Hyperlipidemia Brother    Hypertension Brother     No Known Allergies  Current Medications:   Current Outpatient Medications:    ibuprofen (ADVIL) 600 MG tablet, Take 1 tablet (600 mg total) by mouth every 6 (six) hours as needed., Disp: 30 tablet, Rfl: 0   lisinopril-hydrochlorothiazide (ZESTORETIC) 10-12.5 MG tablet, Take 1 tablet by mouth daily., Disp: 30 tablet, Rfl: 3   Multiple Vitamins-Calcium (ONE-A-DAY WOMENS PO), Take 1 tablet by  mouth daily., Disp: , Rfl:    ondansetron (ZOFRAN ODT) 4 MG disintegrating tablet, Take 1 tablet (4 mg total) by mouth every 8 (eight) hours as needed for nausea or vomiting., Disp: 20 tablet, Rfl: 0   pantoprazole (PROTONIX) 40 MG tablet, Take 1 tablet (40 mg total) by mouth daily., Disp: 30 tablet, Rfl: 3   Review of Systems:   ROS Negative unless otherwise specified per HPI.  Vitals:   Vitals:   04/20/21 1327  BP: 130/72  Pulse: 77  Temp: 98 F (36.7 C)  TempSrc: Temporal  SpO2: 98%  Weight: 222  lb (100.7 kg)  Height: 5\' 4"  (1.626 m)     Body mass index is 38.11 kg/m.  Physical Exam:   Physical Exam Vitals and nursing note reviewed.  Constitutional:      General: She is not in acute distress.    Appearance: She is well-developed. She is not ill-appearing or toxic-appearing.  Cardiovascular:     Rate and Rhythm: Normal rate and regular rhythm.     Pulses: Normal pulses.     Heart sounds: Normal heart sounds, S1 normal and S2 normal.  Pulmonary:     Effort: Pulmonary effort is normal.     Breath sounds: Normal breath sounds.  Abdominal:     General: Abdomen is flat. Bowel sounds are normal.     Palpations: Abdomen is soft.  Skin:    General: Skin is warm and dry.  Neurological:     Mental Status: She is alert.     GCS: GCS eye subscore is 4. GCS verbal subscore is 5. GCS motor subscore is 6.  Psychiatric:        Speech: Speech normal.        Behavior: Behavior normal. Behavior is cooperative.    Assessment and Plan:   Diarrhea, unspecified type Uncontrolled Will obtain blood work and urinalysis with this level of dehydration and check electrolyte status We will also check for C. difficile given recent hospitalization after surgery and antibiotic usage Discussed need for ongoing hydration and dietary compliance given recent surgical changes to gut May need to reach out to her surgeon if diarrhea persists without explanation versus consider GI referral Discussed that if she develops concerns for decreased blood pressure with lightheadedness, dizziness, presyncope --will need likely IV fluids  Essential hypertension Normotensive in setting of ongoing weight loss and suspected dehydration Continue to hold blood pressure medication at this time Continue to monitor blood pressure and if begins to increase above 140/90 consistently or if develops worsening swelling, will consider resumption of blood pressure medication  I,Havlyn C Ratchford,acting as a scribe for  , PA.,have documented all relevant documentation on the behalf of [employer], PA,as directed by  Jarold Motto, PA while in the presence of Jarold Motto, Jarold Motto.  I, [name], Jarold Motto, have reviewed all documentation for this visit. The documentation on 04/20/21 for the exam, diagnosis, procedures, and orders are all accurate and complete.  13/07/22, PA-C

## 2021-04-20 NOTE — Patient Instructions (Addendum)
It was great to see you!  I will be in touch with your blood work, stool study and urine results.  Continue to hold blood pressure medication while you have this ongoing diarrhea.  Continue to push fluids. If any worsening lightheadedness/dizziness, let us know or go to the ER.  Take care,  Jarold Motto PA-C

## 2021-04-21 ENCOUNTER — Other Ambulatory Visit: Payer: 59

## 2021-04-21 LAB — URINALYSIS, ROUTINE W REFLEX MICROSCOPIC
Bilirubin Urine: NEGATIVE
Hgb urine dipstick: NEGATIVE
Ketones, ur: NEGATIVE
Leukocytes,Ua: NEGATIVE
Nitrite: NEGATIVE
RBC / HPF: NONE SEEN (ref 0–?)
Specific Gravity, Urine: >=1.03 — AB (ref 1.000–1.030)
Total Protein, Urine: NEGATIVE
Urine Glucose: NEGATIVE
Urobilinogen, UA: 0.2 (ref 0.0–1.0)
pH: 5.5 (ref 5.0–8.0)

## 2021-04-21 NOTE — Addendum Note (Signed)
Addended by: Lorn Junes on: 04/21/2021 04:24 PM   Modules accepted: Orders

## 2021-04-22 ENCOUNTER — Other Ambulatory Visit: Payer: Self-pay | Admitting: Physician Assistant

## 2021-04-22 ENCOUNTER — Telehealth: Payer: Self-pay

## 2021-04-22 DIAGNOSIS — R710 Precipitous drop in hematocrit: Secondary | ICD-10-CM

## 2021-04-22 NOTE — Telephone Encounter (Signed)
See result notes. 

## 2021-04-22 NOTE — Telephone Encounter (Signed)
Patient states she had a missed call from our office but not a VM.  I can not find a recent note of a call made.  Please follow back up with patient in regard.

## 2021-04-24 LAB — GASTROINTESTINAL PATHOGEN PANEL PCR
C. difficile Tox A/B, PCR: NOT DETECTED
Campylobacter, PCR: NOT DETECTED
Cryptosporidium, PCR: NOT DETECTED
E coli (ETEC) LT/ST PCR: NOT DETECTED
E coli (STEC) stx1/stx2, PCR: NOT DETECTED
E coli 0157, PCR: NOT DETECTED
Giardia lamblia, PCR: NOT DETECTED
Norovirus, PCR: NOT DETECTED
Rotavirus A, PCR: NOT DETECTED
Salmonella, PCR: NOT DETECTED
Shigella, PCR: NOT DETECTED

## 2021-05-06 ENCOUNTER — Other Ambulatory Visit: Payer: 59

## 2021-07-02 ENCOUNTER — Telehealth: Payer: Self-pay

## 2021-07-02 NOTE — Telephone Encounter (Signed)
Patent needs a more detailed note for her employer. Her HR department will follow providers orders but it needs to specific. No lift over -- pounds, no pulling etc. Please call her I you need more details. thanks

## 2021-07-03 NOTE — Telephone Encounter (Signed)
Left message on voicemail to call office.  

## 2021-07-03 NOTE — Telephone Encounter (Signed)
Please see message about work excuse.

## 2021-07-06 ENCOUNTER — Encounter: Payer: Self-pay | Admitting: Physician Assistant

## 2021-07-06 NOTE — Telephone Encounter (Signed)
Pt called back told her we need to know what pound lifting she is comfortable with and pulling for work note? Pt said she would be comfortable pulling and lifting up to 75 pounds. Told her okay will redo note and you can access it from My Chart. Pt verbalized understanding.

## 2021-07-06 NOTE — Telephone Encounter (Signed)
Note for work done 

## 2021-09-09 ENCOUNTER — Encounter: Payer: Self-pay | Admitting: Physician Assistant

## 2021-09-09 ENCOUNTER — Ambulatory Visit (INDEPENDENT_AMBULATORY_CARE_PROVIDER_SITE_OTHER): Payer: Managed Care, Other (non HMO) | Admitting: Physician Assistant

## 2021-09-09 VITALS — BP 120/80 | HR 73 | Temp 98.2°F | Ht 64.0 in | Wt 214.2 lb

## 2021-09-09 DIAGNOSIS — I10 Essential (primary) hypertension: Secondary | ICD-10-CM | POA: Diagnosis not present

## 2021-09-09 DIAGNOSIS — M7989 Other specified soft tissue disorders: Secondary | ICD-10-CM | POA: Diagnosis not present

## 2021-09-09 DIAGNOSIS — K219 Gastro-esophageal reflux disease without esophagitis: Secondary | ICD-10-CM

## 2021-09-09 DIAGNOSIS — E785 Hyperlipidemia, unspecified: Secondary | ICD-10-CM | POA: Diagnosis not present

## 2021-09-09 DIAGNOSIS — Z6841 Body Mass Index (BMI) 40.0 and over, adult: Secondary | ICD-10-CM | POA: Diagnosis not present

## 2021-09-09 DIAGNOSIS — Z0001 Encounter for general adult medical examination with abnormal findings: Secondary | ICD-10-CM | POA: Diagnosis not present

## 2021-09-09 DIAGNOSIS — E669 Obesity, unspecified: Secondary | ICD-10-CM | POA: Diagnosis not present

## 2021-09-09 DIAGNOSIS — Z9884 Bariatric surgery status: Secondary | ICD-10-CM

## 2021-09-09 DIAGNOSIS — E8881 Metabolic syndrome: Secondary | ICD-10-CM | POA: Diagnosis not present

## 2021-09-09 DIAGNOSIS — G4700 Insomnia, unspecified: Secondary | ICD-10-CM | POA: Diagnosis not present

## 2021-09-09 DIAGNOSIS — Z1159 Encounter for screening for other viral diseases: Secondary | ICD-10-CM

## 2021-09-09 LAB — COMPREHENSIVE METABOLIC PANEL
ALT: 23 U/L (ref 0–35)
AST: 22 U/L (ref 0–37)
Albumin: 4 g/dL (ref 3.5–5.2)
Alkaline Phosphatase: 89 U/L (ref 39–117)
BUN: 9 mg/dL (ref 6–23)
CO2: 27 mEq/L (ref 19–32)
Calcium: 9.3 mg/dL (ref 8.4–10.5)
Chloride: 106 mEq/L (ref 96–112)
Creatinine, Ser: 0.76 mg/dL (ref 0.40–1.20)
GFR: 87.5 mL/min (ref >60.00)
Glucose, Bld: 94 mg/dL (ref 70–99)
Potassium: 3.7 mEq/L (ref 3.5–5.1)
Sodium: 139 mEq/L (ref 135–145)
Total Bilirubin: 0.7 mg/dL (ref 0.2–1.2)
Total Protein: 7.1 g/dL (ref 6.0–8.3)

## 2021-09-09 LAB — CBC WITH DIFFERENTIAL/PLATELET
Basophils Absolute: 0 10*3/uL (ref 0.0–0.1)
Basophils Relative: 0.8 % (ref 0.0–3.0)
Eosinophils Absolute: 0.2 10*3/uL (ref 0.0–0.7)
Eosinophils Relative: 3.2 % (ref 0.0–5.0)
HCT: 31.2 % — ABNORMAL LOW (ref 36.0–46.0)
Hemoglobin: 10.3 g/dL — ABNORMAL LOW (ref 12.0–15.0)
Lymphocytes Relative: 55.3 % — ABNORMAL HIGH (ref 12.0–46.0)
Lymphs Abs: 2.9 10*3/uL (ref 0.7–4.0)
MCHC: 32.8 g/dL (ref 30.0–36.0)
MCV: 82.1 fl (ref 78.0–100.0)
Monocytes Absolute: 0.5 10*3/uL (ref 0.1–1.0)
Monocytes Relative: 10.4 % (ref 3.0–12.0)
Neutro Abs: 1.6 10*3/uL (ref 1.4–7.7)
Neutrophils Relative %: 30.3 % — ABNORMAL LOW (ref 43.0–77.0)
Platelets: 268 10*3/uL (ref 150.0–400.0)
RBC: 3.8 Mil/uL — ABNORMAL LOW (ref 3.87–5.11)
RDW: 15.2 % (ref 11.5–15.5)
WBC: 5.2 10*3/uL (ref 4.0–10.5)

## 2021-09-09 LAB — LIPID PANEL
Cholesterol: 120 mg/dL (ref 0–200)
HDL: 47 mg/dL (ref >39.00)
LDL Cholesterol: 65 mg/dL (ref 0–99)
NonHDL: 73.49
Total CHOL/HDL Ratio: 3
Triglycerides: 43 mg/dL (ref 0.0–149.0)
VLDL: 8.6 mg/dL (ref 0.0–40.0)

## 2021-09-09 MED ORDER — PANTOPRAZOLE SODIUM 40 MG PO TBEC: 40.0000 mg | DELAYED_RELEASE_TABLET | Freq: Every day | ORAL | 3 refills | Status: DC

## 2021-09-09 MED ORDER — ONDANSETRON 4 MG PO TBDP: 4.0000 mg | ORAL_TABLET | Freq: Three times a day (TID) | ORAL | 0 refills | Status: DC | PRN

## 2021-09-09 MED ORDER — TRAZODONE HCL 50 MG PO TABS: 50.0000 mg | ORAL_TABLET | Freq: Every evening | ORAL | 3 refills | Status: DC | PRN

## 2021-09-09 MED ORDER — FUROSEMIDE 20 MG PO TABS: 20.0000 mg | ORAL_TABLET | Freq: Every day | ORAL | 3 refills | Status: DC | PRN

## 2021-09-09 NOTE — Progress Notes (Addendum)
? ?Subjective:  ?  ?Natasha Doyle is a 56 y.o. female and is here for a comprehensive physical exam. ? ?HPI ? ?Health Maintenance Due  ?Topic Date Due  ? Hepatitis C Screening  Never done  ? Zoster Vaccines- Shingrix (1 of 2) Never done  ? PAP SMEAR-Modifier  09/28/2016  ? ?Acute Concerns: ?Insomnia ?States that she has had a hard time regulating her sleep schedule due to the hours that she is working. Reports that she has tried melatonin and Dr. Lorrin Mais but this hasn't provided much relief. Although this hasn't been a huge concern for her, she does know how important it is to get an adequate amount of sleep at night.  She is getting about 3 hours of sleep per afternoon (works third shift.) ? ?Chronic Issues: ?HTN ?Currently not taking any medication. Although her BP appears to be controlled, she does still notice some LE swelling at times. Due to this she would like to trial a medication as needed that could help with this. At home blood pressure readings are: not checked. Patient denies chest pain, SOB, blurred vision, dizziness, or unusual headaches. Denies excessive caffeine intake, stimulant usage, excessive alcohol intake, or increase in salt consumption. ? ?BP Readings from Last 3 Encounters:  ?09/09/21 120/80  ?04/20/21 130/72  ?04/10/21 130/78  ? ?Hx of Bariatric Surgery ?Back in November of 2016, pt underwent a successful gastric sleeve resection as well as hiatal hernia repair completed by Dr. Darnell Level, general surgery. Prior to this surgery Shiraz was 287 and as of 09/26/18 she had gotten down to 179. Unfortunately due to reoccurrence of hiatal hernia and worsening acid reflux, pt decided to undergo sleeve therapy and convert her gastric sleeve to a duodenal switch so her additional hiatal repair, completed in 2021, could be more successful. In April of 2021, Harshika underwent revision of sleeve to duodenal switch to double anastomosis with no complications. At this time she is still trying to improve her  diet and exercise regularly, but is not take any bariatric vitamins daily.  ? ?GERD ?Although Malae has undergone hiatal repair in 2016 at the same time as her gastric sleeve resection and an additional repair in 2021, she experienced the return of her acid reflux sx. Due to this she has trialed multiple PPIs but failed all interventions. At this time she is regularly following up with Dr. Darnell Level, general surgery to attempt an additional repair of this issue since her gastric resection has been corrected. In the meantime she is compliant with taking protonix 40 mg daily and levsin 0.125 mg every four hours as needed with no complications. She does still experience some nausea with her acid reflux, but is managing well.  ? ?Insulin resistance ?Currently not on any meds. Blood sugars at home are: not checked. Denies: hypoglycemic or hyperglycemic episodes or symptoms.  ? ?Lab Results  ?Component Value Date  ? HGBA1C 5.7 (A) 07/16/2020  ? ? ? ?Health Maintenance: ?Immunizations -- Covid- UTD ?Influenza- Declined ?Tdap- UTD;2019 ?Colonoscopy -- Due; 2015 ?Mammogram -- Due; 2021 ?PAP -- Due; 2015 ?Bone Density -- N/A ?Diet -- Eats all food groups  ?Ophthalmology- Appt next month ?Dentistry- Will update soon ?Sleep habits -- Significant concerns; see above ?Exercise -- As able--has an active job ?Weight -- Stable ?Mood -- Stable ?Weight history: ?Wt Readings from Last 10 Encounters:  ?09/09/21 214 lb 3.2 oz (97.2 kg)  ?04/20/21 222 lb (100.7 kg)  ?11/27/20 250 lb (113.4 kg)  ?07/16/20 246 lb 6.1 oz (  111.8 kg)  ?04/29/20 233 lb (105.7 kg)  ?03/19/20 193 lb (87.5 kg)  ?01/30/20 220 lb 8 oz (100 kg)  ?10/30/19 234 lb 4 oz (106.3 kg)  ?08/17/18 186 lb (84.4 kg)  ?03/15/18 178 lb (80.7 kg)  ? ?Body mass index is 36.77 kg/m?Marland Kitchen ?Patient's last menstrual period was 09/03/2012. ?Alcohol use:  reports that she does not currently use alcohol. ?Tobacco use:  ?Tobacco Use: Low Risk   ? Smoking Tobacco Use: Never  ? Smokeless Tobacco  Use: Never  ? Passive Exposure: Not on file  ? ? ? ? ?  10/30/2019  ?  2:35 PM  ?Depression screen PHQ 2/9  ?Decreased Interest 0  ?Down, Depressed, Hopeless 0  ?PHQ - 2 Score 0  ? ? ? ?Other providers/specialists: ?Patient Care Team: ?Inda Coke, PA as PCP - General (Physician Assistant)  ? ? ?PMHx, SurgHx, SocialHx, Medications, and Allergies were reviewed in the Visit Navigator and updated as appropriate.  ? ?Past Medical History:  ?Diagnosis Date  ? Diabetes mellitus without complication ([place])   ? borderline  ? GERD (gastroesophageal reflux disease)   ? Hypertension   ? Obesity   ? Vaginal delivery 1989, 1992  ? ? ? ?Past Surgical History:  ?Procedure Laterality Date  ? BREATH TEK H PYLORI N/A 10/22/2014  ? Procedure: BREATH TEK H PYLORI;  Surgeon: Greer Pickerel, MD;  Location: [facility];  Service: General;  Laterality: N/A;  ? CHOLECYSTECTOMY    ? HERNIA REPAIR    ? HIATAL HERNIA REPAIR  01/04/2020  ? LAPAROSCOPIC GASTRIC SLEEVE RESECTION N/A 04/29/2015  ? Procedure: LAPAROSCOPIC GASTRIC SLEEVE RESECTION;  Surgeon: Bonner Puna, MD;  Location: [facility];  Service: General;  Laterality: N/A;  ? ? ? ?Family History  ?Problem Relation Age of Onset  ? Cancer Mother   ?     breast  ? Hypertension Mother   ? Hyperlipidemia Mother   ? Alcohol abuse Father   ? Heart disease Brother   ? Hyperlipidemia Brother   ? Hypertension Brother   ? ? ?Social History  ? ?Tobacco Use  ? Smoking status: Never  ? Smokeless tobacco: Never  ?Vaping Use  ? Vaping Use: Never used  ?Substance Use Topics  ? Alcohol use: Not Currently  ? Drug use: No  ? ? ?Review of Systems:  ? ?Review of Systems  ?Constitutional:  Negative for chills, fever, malaise/fatigue and weight loss.  ?HENT:  Negative for hearing loss, sinus pain and sore throat.   ?Respiratory:  Negative for cough and hemoptysis.   ?Cardiovascular:  Negative for chest pain, palpitations, leg swelling and PND.  ?Gastrointestinal:  Negative for abdominal pain, constipation,  diarrhea, heartburn, nausea and vomiting.  ?Genitourinary:  Negative for dysuria, frequency and urgency.  ?Musculoskeletal:  Negative for back pain, myalgias and neck pain.  ?Skin:  Negative for itching and rash.  ?Neurological:  Negative for dizziness, tingling, seizures and headaches.  ?Endo/Heme/Allergies:  Negative for polydipsia.  ?Psychiatric/Behavioral:  Negative for depression. The patient is not nervous/anxious.   ? ?Objective:  ? ?BP 120/80   Pulse 73   Temp 98.2 ?F (36.8 ?C)   Ht 5\' 4"  (1.626 m)   Wt 214 lb 3.2 oz (97.2 kg)   LMP 09/03/2012   SpO2 97%   BMI 36.77 kg/m?  ?Body mass index is 36.77 kg/m?. ? ? ?General Appearance:    Alert, cooperative, no distress, appears stated age  ?Head:    Normocephalic, without obvious abnormality, atraumatic  ?Eyes:  PERRL, conjunctiva/corneas clear, EOM's intact, fundi  ?  benign, both eyes  ?Ears:    Normal TM's and external ear canals, both ears  ?Nose:   Nares normal, septum midline, mucosa normal, no drainage    or sinus tenderness  ?Throat:   Lips, mucosa, and tongue normal; teeth and gums normal  ?Neck:   Supple, symmetrical, trachea midline, no adenopathy;  ?  thyroid:  no enlargement/tenderness/nodules; no carotid ?  bruit or JVD  ?Back:     Symmetric, no curvature, ROM normal, no CVA tenderness  ?Lungs:     Clear to auscultation bilaterally, respirations unlabored  ?Chest Wall:    No tenderness or deformity  ? Heart:    Regular rate and rhythm, S1 and S2 normal, no murmur, rub or gallop  ?Breast Exam:    Deferred  ?Abdomen:     Soft, non-tender, bowel sounds active all four quadrants,  ?  no masses, no organomegaly  ?Genitalia:    Deferred  ?Extremities:   Extremities normal, atraumatic, no cyanosis  ?1+ edema in b/l ankles  ?Pulses:   2+ and symmetric all extremities  ?Skin:   Skin color, texture, turgor normal, no rashes or lesions  ?Lymph nodes:   Cervical, supraclavicular, and axillary nodes normal  ?Neurologic:   CNII-XII intact, normal  strength, sensation and reflexes  ?  throughout  ? ? ?Assessment/Plan:  ? ?Encounter for general adult medical examination with abnormal findings ?Today patient counseled on age appropriate routine health concerns for [place]

## 2021-09-09 NOTE — Patient Instructions (Addendum)
It was great to see you! ? ?Please call the breast center to schedule your mammogram ?Please return for your PAP smear when you are ready ? ?Trial trazodone 25-50 mg about 30 min to 1 hour prior to laying down for bed. ?May also take melatonin with this. ? ?For leg swelling, trial lasix 20 mg as needed. ? ?Please go to the lab for blood work.  ? ?Our office will call you with your results unless you have chosen to receive results via MyChart. ? ?If your blood work is normal we will follow-up each year for physicals and as scheduled for chronic medical problems. ? ?If anything is abnormal we will treat accordingly and get you in for a follow-up. ? ?Take care, ? ?Natasha Doyle ?  ?

## 2021-09-10 LAB — HEPATITIS C ANTIBODY
Hepatitis C Ab: NONREACTIVE
SIGNAL TO CUT-OFF: 0.11 (ref <1.00)

## 2021-09-11 ENCOUNTER — Encounter: Payer: Self-pay | Admitting: Physician Assistant

## 2021-09-11 ENCOUNTER — Other Ambulatory Visit: Payer: Self-pay | Admitting: Physician Assistant

## 2021-09-11 DIAGNOSIS — R710 Precipitous drop in hematocrit: Secondary | ICD-10-CM

## 2021-09-14 ENCOUNTER — Other Ambulatory Visit (INDEPENDENT_AMBULATORY_CARE_PROVIDER_SITE_OTHER): Payer: Managed Care, Other (non HMO)

## 2021-09-14 DIAGNOSIS — R710 Precipitous drop in hematocrit: Secondary | ICD-10-CM | POA: Diagnosis not present

## 2021-09-14 LAB — IBC + FERRITIN
Ferritin: 46.4 ng/mL (ref 10.0–291.0)
Iron: 88 ug/dL (ref 42–145)
Saturation Ratios: 25.9 % (ref 20.0–50.0)
TIBC: 340.2 ug/dL (ref 250.0–450.0)
Transferrin: 243 mg/dL (ref 212.0–360.0)

## 2021-09-14 LAB — VITAMIN B12: Vitamin B-12: 655 pg/mL (ref 211–911)

## 2021-09-15 ENCOUNTER — Encounter: Payer: Self-pay | Admitting: Physician Assistant

## 2021-09-16 ENCOUNTER — Other Ambulatory Visit: Payer: Self-pay | Admitting: Physician Assistant

## 2021-09-16 DIAGNOSIS — R710 Precipitous drop in hematocrit: Secondary | ICD-10-CM

## 2021-10-02 ENCOUNTER — Other Ambulatory Visit: Payer: Self-pay | Admitting: *Deleted

## 2021-10-02 MED ORDER — PANTOPRAZOLE SODIUM 40 MG PO TBEC: 40.0000 mg | DELAYED_RELEASE_TABLET | Freq: Every day | ORAL | 1 refills | Status: AC

## 2021-10-02 MED ORDER — TRAZODONE HCL 50 MG PO TABS: 50.0000 mg | ORAL_TABLET | Freq: Every evening | ORAL | 0 refills | Status: DC | PRN

## 2021-10-02 MED ORDER — FUROSEMIDE 20 MG PO TABS: 20.0000 mg | ORAL_TABLET | Freq: Every day | ORAL | 0 refills | Status: DC | PRN

## 2021-12-07 DIAGNOSIS — R1031 Right lower quadrant pain: Secondary | ICD-10-CM | POA: Insufficient documentation

## 2021-12-07 DIAGNOSIS — R112 Nausea with vomiting, unspecified: Secondary | ICD-10-CM | POA: Insufficient documentation

## 2021-12-08 ENCOUNTER — Other Ambulatory Visit: Payer: Self-pay

## 2021-12-08 ENCOUNTER — Emergency Department (HOSPITAL_COMMUNITY): Payer: Commercial Managed Care - HMO

## 2021-12-08 ENCOUNTER — Emergency Department (HOSPITAL_COMMUNITY)
Admission: EM | Admit: 2021-12-08 | Discharge: 2021-12-08 | Disposition: A | Discharge status: Home / Self Care | Payer: Commercial Managed Care - HMO | Attending: Emergency Medicine | Admitting: Emergency Medicine

## 2021-12-08 ENCOUNTER — Encounter (HOSPITAL_COMMUNITY): Payer: Self-pay

## 2021-12-08 DIAGNOSIS — K4090 Unilateral inguinal hernia, without obstruction or gangrene, not specified as recurrent: Secondary | ICD-10-CM

## 2021-12-08 DIAGNOSIS — K529 Noninfective gastroenteritis and colitis, unspecified: Secondary | ICD-10-CM

## 2021-12-08 LAB — COMPREHENSIVE METABOLIC PANEL
ALT: 36 U/L (ref 0–44)
AST: 26 U/L (ref 15–41)
Albumin: 4 g/dL (ref 3.5–5.0)
Alkaline Phosphatase: 108 U/L (ref 38–126)
Anion gap: 12 (ref 5–15)
BUN: 11 mg/dL (ref 6–20)
CO2: 22 mmol/L (ref 22–32)
Calcium: 9.4 mg/dL (ref 8.9–10.3)
Chloride: 109 mmol/L (ref 98–111)
Creatinine, Ser: 0.9 mg/dL (ref 0.44–1.00)
GFR, Estimated: >60 mL/min (ref >60)
Glucose, Bld: 110 mg/dL — ABNORMAL HIGH (ref 70–99)
Potassium: 3.7 mmol/L (ref 3.5–5.1)
Sodium: 143 mmol/L (ref 135–145)
Total Bilirubin: 0.6 mg/dL (ref 0.3–1.2)
Total Protein: 7.4 g/dL (ref 6.5–8.1)

## 2021-12-08 LAB — CBC
HCT: 35.6 % — ABNORMAL LOW (ref 36.0–46.0)
Hemoglobin: 11.5 g/dL — ABNORMAL LOW (ref 12.0–15.0)
MCH: 27.9 pg (ref 26.0–34.0)
MCHC: 32.3 g/dL (ref 30.0–36.0)
MCV: 86.4 fL (ref 80.0–100.0)
Platelets: 298 10*3/uL (ref 150–400)
RBC: 4.12 MIL/uL (ref 3.87–5.11)
RDW: 14.6 % (ref 11.5–15.5)
WBC: 6.7 10*3/uL (ref 4.0–10.5)
nRBC: 0 % (ref 0.0–0.2)

## 2021-12-08 LAB — LIPASE, BLOOD: Lipase: 24 U/L (ref 11–51)

## 2021-12-08 LAB — URINALYSIS, ROUTINE W REFLEX MICROSCOPIC
Bilirubin Urine: NEGATIVE
Glucose, UA: NEGATIVE mg/dL
Hgb urine dipstick: NEGATIVE
Ketones, ur: 5 mg/dL — AB
Leukocytes,Ua: NEGATIVE
Nitrite: NEGATIVE
Protein, ur: NEGATIVE mg/dL
Specific Gravity, Urine: 1.028 (ref 1.005–1.030)
pH: 5 (ref 5.0–8.0)

## 2021-12-08 LAB — I-STAT BETA HCG BLOOD, ED (MC, WL, AP ONLY): I-stat hCG, quantitative: <5 m[IU]/mL (ref <5)

## 2021-12-08 MED ORDER — IOHEXOL 9 MG/ML PO SOLN
ORAL | Status: AC
  Filled 2021-12-08: qty 1000

## 2021-12-08 MED ORDER — ONDANSETRON 4 MG PO TBDP: 4.0000 mg | ORAL_TABLET | Freq: Three times a day (TID) | ORAL | 0 refills | Status: DC | PRN

## 2021-12-08 MED ORDER — MORPHINE SULFATE (PF) 4 MG/ML IV SOLN
4.0000 mg | Freq: Once | INTRAVENOUS | Status: AC
  Administered 2021-12-08: 4 mg via INTRAVENOUS
  Filled 2021-12-08: qty 1

## 2021-12-08 MED ORDER — ONDANSETRON HCL 4 MG/2ML IJ SOLN
4.0000 mg | Freq: Once | INTRAMUSCULAR | Status: AC
  Administered 2021-12-08: 4 mg via INTRAVENOUS
  Filled 2021-12-08: qty 2

## 2021-12-08 MED ORDER — IOHEXOL 350 MG/ML SOLN
100.0000 mL | Freq: Once | INTRAVENOUS | Status: AC | PRN
  Administered 2021-12-08: 100 mL via INTRAVENOUS

## 2021-12-08 MED ORDER — DICYCLOMINE HCL 20 MG PO TABS: 20.0000 mg | ORAL_TABLET | Freq: Two times a day (BID) | ORAL | 0 refills | Status: DC

## 2021-12-08 MED ORDER — AMOXICILLIN-POT CLAVULANATE 875-125 MG PO TABS
1.0000 | ORAL_TABLET | Freq: Two times a day (BID) | ORAL | 0 refills | Status: DC
Start: 2021-12-08 — End: 2022-02-11

## 2021-12-08 NOTE — ED Provider Notes (Signed)
Signout received on this 56 year old female who has history of multiple surgeries who had acute onset of right lower quadrant abdominal pain associated with nausea vomiting around 9 PM last night.  After dose of pain medication in the emergency room she reports significant symptomatic improvement.  Her work-up so far has revealed no leukocytosis, hemoglobin of 11.5 which is above her baseline.  CMP unremarkable.  UA without UTI.  At the time of signout patient CT abdomen pelvis with contrast is pending.  She is undergoing gastric bypass protocol and drinking contrast. Physical Exam  BP (!) 157/77   Pulse 64   Temp 98.4 F (36.9 C) (Oral)   Resp 16   Ht 5\' 4"  (1.626 m)   Wt 92.1 kg   LMP 09/03/2012   SpO2 100%   BMI 34.84 kg/m     Procedures  Procedures  ED Course / MDM    Medical Decision Making Amount and/or Complexity of Data Reviewed Labs: ordered. Radiology: ordered.  Risk Prescription drug management.   CT with evidence of inflammatory/infectious changes.  Will prescribe Augmentin.  Still with right inguinal hernia.  Without signs of other concerning features.  Will provide Zofran, and Bentyl for symptom management.  Patient is remained stable with controlled symptoms throughout her stay following initial medications. Patient is agreeable and voices understanding with plan. General surgery follow up provided.        09/05/2012, PA-C 12/08/21 12/10/21    [id number], MD 12/09/21 [phone]

## 2021-12-08 NOTE — ED Triage Notes (Addendum)
Pt c/o lower abdominal pain and vomiting that just started today. Pt had bariatric surgery 9 months ago.

## 2021-12-08 NOTE — ED Provider Notes (Signed)
[facility] EMERGENCY DEPARTMENT Provider Note   CSN: [id number] Arrival date & time: 12/07/21  2357     History  Chief Complaint  Patient presents with   Abdominal Pain    Natasha Doyle is a 56 y.o. female with history of multiple abdominal surgeries with hiatal hernia repairs, gastric sleeve, and subsequently duodenal switch 9 months ago.  She presents to the emergency department this evening with onset of right lower abdominal pain around 9 PM.  Pain radiates to the paramedical and epigastric regions with associated nausea and vomiting with NBNB emesis x2.  No fevers, chills, decreased appetite prior.  States she was feeling her normal self throughout the day until the evening when her symptoms came on gradually.  She is 12 years postmenopausal.  Denies any diarrhea, last normal bowel movement was earlier today.  No urinary symptoms.  I personally reviewed this patient's medical records.  She has history of hypertension, type 2 diabetes, and obesity.  She is not anticoagulated. Chronic BLE edema, on lasix. No acute change today.  HPI     Home Medications Prior to Admission medications   Medication Sig Start Date End Date Taking? Authorizing Provider  furosemide (LASIX) 20 MG tablet Take 1 tablet (20 mg total) by mouth daily as needed. 10/02/21   Jarold Motto, PA  hyoscyamine (LEVSIN SL) 0.125 MG SL tablet Place under the tongue every 4 (four) hours. 08/04/21   [provider]  ibuprofen (ADVIL) 600 MG tablet Take 1 tablet (600 mg total) by mouth every 6 (six) hours as needed. 04/10/21   Domenick Gong, MD  Multiple Vitamins-Calcium (ONE-A-DAY WOMENS PO) Take 1 tablet by mouth daily.    [provider]  ondansetron (ZOFRAN ODT) 4 MG disintegrating tablet Take 1 tablet (4 mg total) by mouth every 8 (eight) hours as needed for nausea or vomiting. 09/09/21   Jarold Motto, PA  pantoprazole (PROTONIX) 40 MG tablet Take 1 tablet (40 mg  total) by mouth daily. 10/02/21   Jarold Motto, PA  traZODone (DESYREL) 50 MG tablet Take 1 tablet (50 mg total) by mouth at bedtime as needed for sleep. 10/02/21   Jarold Motto, PA      Allergies    Patient has no known allergies.    Review of Systems   Review of Systems  Constitutional: Negative.   HENT: Negative.    Respiratory: Negative.    Cardiovascular: Negative.   Gastrointestinal:  Positive for abdominal pain, nausea and vomiting. Negative for abdominal distention, anal bleeding, blood in stool, constipation, diarrhea and rectal pain.  Genitourinary: Negative.   Musculoskeletal: Negative.   Skin: Negative.   Neurological: Negative.   Hematological: Negative.     Physical Exam Updated Vital Signs BP (!) 167/79   Pulse 63   Temp 98.4 F (36.9 C) (Oral)   Resp 18   Ht 5\' 4"  (1.626 m)   Wt 92.1 kg   LMP 09/03/2012   SpO2 98%   BMI 34.84 kg/m  Physical Exam Vitals and nursing note reviewed.  Constitutional:      Appearance: She is obese. She is not ill-appearing or toxic-appearing.  HENT:     Head: Normocephalic and atraumatic.     Mouth/Throat:     Mouth: Mucous membranes are moist.     Pharynx: No oropharyngeal exudate or posterior oropharyngeal erythema.  Eyes:     General:        Right eye: No discharge.  Left eye: No discharge.     Conjunctiva/sclera: Conjunctivae normal.  Cardiovascular:     Rate and Rhythm: Normal rate and regular rhythm.     Pulses: Normal pulses.     Heart sounds: Normal heart sounds. No murmur heard. Pulmonary:     Effort: Pulmonary effort is normal. No respiratory distress.     Breath sounds: Normal breath sounds. No wheezing or rales.  Abdominal:     General: Abdomen is protuberant. Bowel sounds are normal. There is no distension.     Palpations: Abdomen is soft.     Tenderness: There is abdominal tenderness in the right lower quadrant, epigastric area and periumbilical area. There is no guarding or rebound.  Positive signs include McBurney's sign and psoas sign. Negative signs include Murphy's sign, Rovsing's sign and obturator sign.  Musculoskeletal:        General: No deformity.     Cervical back: Neck supple.     Right lower leg: 2+ Edema present.     Left lower leg: 2+ Edema present.  Skin:    General: Skin is warm and dry.  Neurological:     Mental Status: She is alert. Mental status is at baseline.  Psychiatric:        Mood and Affect: Mood normal.     ED Results / Procedures / Treatments   Labs (all labs ordered are listed, but only abnormal results are displayed) Labs Reviewed  COMPREHENSIVE METABOLIC PANEL - Abnormal; Notable for the following components:      Result Value   Glucose, Bld 110 (*)    All other components within normal limits  CBC - Abnormal; Notable for the following components:   Hemoglobin 11.5 (*)    HCT 35.6 (*)    All other components within normal limits  URINALYSIS, ROUTINE W REFLEX MICROSCOPIC - Abnormal; Notable for the following components:   APPearance HAZY (*)    Ketones, ur 5 (*)    All other components within normal limits  LIPASE, BLOOD  I-STAT BETA HCG BLOOD, ED ([facility], [facility], [facility] ONLY)    EKG None  Radiology No results found.  Procedures Procedures    Medications Ordered in ED Medications  iohexol (OMNIPAQUE) 9 MG/ML oral solution (has no administration in time range)  morphine (PF) 4 MG/ML injection 4 mg (4 mg Intravenous Given 12/08/21 0454)  ondansetron (ZOFRAN) injection 4 mg (4 mg Intravenous Given 12/08/21 0452)    ED Course/ Medical Decision Making/ A&P                           Medical Decision Making 56 year old female with history of multiple abdominal surgeries who presents with concern for onset of right lower abdominal pain this evening.  Continues to have bowel movements and  pass gas since onset of pain.  Hypertensive on intake, vitals otherwise normal.  Cardiopulmonary abdominal exams as above with right lower belly  tenderness palpation with psoas sign.  No CVAT or suprapubic tenderness palpation.  Differential diagnosis includes but is not limited to SBO, appendicitis, ovarian cyst or torsion, mesenteric ischemia, diverticulitis, psoas abscess, gastroenteritis/colitis, biliary colic/choledocholithiasis/cholelithiasis.   Amount and/or Complexity of Data Reviewed Labs: ordered.    Details: CBC without leukocytosis and with mild anemia with hemoglobin 11.5 near patient's baseline.  CMP unremarkable, UA without evidence of infection and lipase is normal Radiology: ordered.  Risk Prescription drug management.   At time of shift change patient is awaiting CT of  the abdomen and pelvis; delay secondary to requirement for oral contrast given necessity for bariatric protocol.  Patient's symptomatology improved after IV antiemetic and analgesia.  Care of this patient signed out to oncoming ED provider Karie Mainland, PA-C at the time of shift change.  All pertinent HPI, physical exam, laboratory findings were discussed with him prior to my partner.  Appreciate his collaboration in care of this patient.  Rocklyn and her sister  voiced understanding of her medical evaluation and treatment plan. Each of their questions answered to their expressed satisfaction.    This chart was dictated using voice recognition software, Dragon. Despite the best efforts of this provider to proofread and correct errors, errors may still occur which can change documentation meaning.    Final Clinical Impression(s) / ED Diagnoses Final diagnoses:  None    Rx / DC Orders ED Discharge Orders     None         Sherrilee Gilles 12/08/21 1308    Dione Booze, MD 12/08/21 [phone]

## 2021-12-18 ENCOUNTER — Ambulatory Visit: Payer: Commercial Managed Care - HMO | Admitting: Physician Assistant

## 2021-12-18 DIAGNOSIS — K625 Hemorrhage of anus and rectum: Secondary | ICD-10-CM | POA: Insufficient documentation

## 2021-12-18 DIAGNOSIS — K602 Anal fissure, unspecified: Secondary | ICD-10-CM | POA: Insufficient documentation

## 2021-12-18 DIAGNOSIS — K5900 Constipation, unspecified: Secondary | ICD-10-CM | POA: Insufficient documentation

## 2021-12-18 NOTE — Progress Notes (Incomplete)
Natasha Doyle is a 56 y.o. female here for a follow up of a pre-existing problem.  SCRIBE STATEMENT  History of Present Illness:   No chief complaint on file.   HPI ED follow-up Patient presented to the ED with right lower quadrant abdominal pain that began around 9 PM on 12/07/2021. Associated with nausea and vomiting. She then went to ED on 12/08/2021. Her bowel movements were normal. In the ED, CT abdomen and Pelvis showed abnormal thick walled loop of small intestine at right abdominopelvic demonstrates Crohn's enteritis. This also showed hernia on the right at high groin containing a small amount of fluid demonstrates inguinal hernia. Had lab work which showed CBC with no leukocytosis. Hemoglobin of 11.5 and CMP unremarkable. UA without UTI signs. Patient symptomatically improved after IV antiemetic and analgesia. Her nausea were improved. She was discharged home in stable condition to follow up with PCP.   Today,      IMPRESSION: The appendix appears normal.  Abnormal thick-walled loop of small intestine at the right abdominopelvic junction, most concerning for Crohn's enteritis or other nonspecific small bowel enteritis.  Hernia on the right at the high groin containing a small amount of fluid but no intestine. I favor that this represents an inguinal hernia but it could represent a low anterior abdominal wall hernia.   Past Medical History:  Diagnosis Date   Diabetes mellitus without complication (HCC)    borderline   GERD (gastroesophageal reflux disease)    Hypertension    Obesity    Vaginal delivery 1989, 1992     Social History   Tobacco Use   Smoking status: Never   Smokeless tobacco: Never  Vaping Use   Vaping Use: Never used  Substance Use Topics   Alcohol use: Not Currently   Drug use: No    Past Surgical History:  Procedure Laterality Date   BARIATRIC SURGERY     BREATH TEK H PYLORI N/A 10/22/2014   Procedure: BREATH TEK H PYLORI;  Surgeon:  Gaynelle Adu, MD;  Location: [facility];  Service: General;  Laterality: N/A;   CHOLECYSTECTOMY     HERNIA REPAIR     HIATAL HERNIA REPAIR  01/04/2020   LAPAROSCOPIC GASTRIC SLEEVE RESECTION N/A 04/29/2015   Procedure: LAPAROSCOPIC GASTRIC SLEEVE RESECTION;  Surgeon: Geoffry Paradise, MD;  Location: [facility];  Service: General;  Laterality: N/A;    Family History  Problem Relation Age of Onset   Cancer Mother        breast   Hypertension Mother    Hyperlipidemia Mother    Alcohol abuse Father    Heart disease Brother    Hyperlipidemia Brother    Hypertension Brother     No Known Allergies  Current Medications:   Current Outpatient Medications:    amoxicillin-clavulanate (AUGMENTIN) 875-125 MG tablet, Take 1 tablet by mouth every 12 (twelve) hours., Disp: 14 tablet, Rfl: 0   dicyclomine (BENTYL) 20 MG tablet, Take 1 tablet (20 mg total) by mouth 2 (two) times daily., Disp: 20 tablet, Rfl: 0   furosemide (LASIX) 20 MG tablet, Take 1 tablet (20 mg total) by mouth daily as needed. (Patient taking differently: Take 20 mg by mouth daily as needed for fluid or edema.), Disp: 90 tablet, Rfl: 0   hyoscyamine (LEVSIN SL) 0.125 MG SL tablet, Place under the tongue every 4 (four) hours. (Patient not taking: Reported on 12/08/2021), Disp: , Rfl:    ibuprofen (ADVIL) 600 MG tablet, Take 1 tablet (600 mg total)  by mouth every 6 (six) hours as needed. (Patient not taking: Reported on 12/08/2021), Disp: 30 tablet, Rfl: 0   Multiple Vitamins-Calcium (ONE-A-DAY WOMENS PO), Take 1 tablet by mouth daily., Disp: , Rfl:    ondansetron (ZOFRAN-ODT) 4 MG disintegrating tablet, Take 1 tablet (4 mg total) by mouth every 8 (eight) hours as needed for nausea or vomiting., Disp: 20 tablet, Rfl: 0   pantoprazole (PROTONIX) 40 MG tablet, Take 1 tablet (40 mg total) by mouth daily., Disp: 90 tablet, Rfl: 1   traZODone (DESYREL) 50 MG tablet, Take 1 tablet (50 mg total) by mouth at bedtime as needed for sleep., Disp: 90  tablet, Rfl: 0   Review of Systems:   ROS Negative unless otherwise specified per HPI.  Vitals:   There were no vitals filed for this visit.   There is no height or weight on file to calculate BMI.  Physical Exam:   Physical Exam  Assessment and Plan:   @DIAGLIST @    I,Savera Doyle,acting as a scribe for , PA.,have documented all relevant documentation on the behalf of [employer], PA,as directed by  Jarold Motto, PA while in the presence of Jarold Motto, Jarold Motto.   ***  [name], PA-C

## 2021-12-22 NOTE — Progress Notes (Signed)
Natasha Doyle is a 56 y.o. female here for a follow on abdominal pain.   SCRIBE STATEMENT  History of Present Illness:   No chief complaint on file.   HPI ED follow-up Patient presented to the ED with right lower quadrant abdominal pain that began around 9 PM on 12/07/2021. Associated with nausea and vomiting. She then went to ED on 12/08/2021. Her bowel movements were normal. In the ED, CT abdomen and Pelvis showed abnormal thick walled loop of small intestine at right abdominopelvic demonstrates Crohn's enteritis. This also showed hernia on the right at high groin containing a small amount of fluid demonstrates inguinal hernia. Had lab work which showed CBC with no leukocytosis. Hemoglobin of 11.5 and CMP unremarkable. UA without UTI signs. Patient symptomatically improved after IV antiemetic and analgesia. Her nausea were improved. She was discharged home in stable condition to follow up with PCP.      Past Medical History:  Diagnosis Date   Diabetes mellitus without complication (HCC)    borderline   GERD (gastroesophageal reflux disease)    Hypertension    Obesity    Vaginal delivery 1989, 1992     Social History   Tobacco Use   Smoking status: Never   Smokeless tobacco: Never  Vaping Use   Vaping Use: Never used  Substance Use Topics   Alcohol use: Not Currently   Drug use: No    Past Surgical History:  Procedure Laterality Date   BARIATRIC SURGERY     BREATH TEK H PYLORI N/A 10/22/2014   Procedure: BREATH TEK H PYLORI;  Surgeon: Gaynelle Adu, MD;  Location: [facility];  Service: General;  Laterality: N/A;   CHOLECYSTECTOMY     HERNIA REPAIR     HIATAL HERNIA REPAIR  01/04/2020   LAPAROSCOPIC GASTRIC SLEEVE RESECTION N/A 04/29/2015   Procedure: LAPAROSCOPIC GASTRIC SLEEVE RESECTION;  Surgeon: Geoffry Paradise, MD;  Location: [facility];  Service: General;  Laterality: N/A;    Family History  Problem Relation Age of Onset   Cancer Mother        breast    Hypertension Mother    Hyperlipidemia Mother    Alcohol abuse Father    Heart disease Brother    Hyperlipidemia Brother    Hypertension Brother     No Known Allergies  Current Medications:   Current Outpatient Medications:    amoxicillin-clavulanate (AUGMENTIN) 875-125 MG tablet, Take 1 tablet by mouth every 12 (twelve) hours., Disp: 14 tablet, Rfl: 0   dicyclomine (BENTYL) 20 MG tablet, Take 1 tablet (20 mg total) by mouth 2 (two) times daily., Disp: 20 tablet, Rfl: 0   furosemide (LASIX) 20 MG tablet, Take 1 tablet (20 mg total) by mouth daily as needed. (Patient taking differently: Take 20 mg by mouth daily as needed for fluid or edema.), Disp: 90 tablet, Rfl: 0   Multiple Vitamins-Calcium (ONE-A-DAY WOMENS PO), Take 1 tablet by mouth daily., Disp: , Rfl:    ondansetron (ZOFRAN-ODT) 4 MG disintegrating tablet, Take 1 tablet (4 mg total) by mouth every 8 (eight) hours as needed for nausea or vomiting., Disp: 20 tablet, Rfl: 0   pantoprazole (PROTONIX) 40 MG tablet, Take 1 tablet (40 mg total) by mouth daily., Disp: 90 tablet, Rfl: 1   traZODone (DESYREL) 50 MG tablet, Take 1 tablet (50 mg total) by mouth at bedtime as needed for sleep., Disp: 90 tablet, Rfl: 0   Review of Systems:   ROS Negative unless otherwise specified per HPI.  Vitals:   There were no vitals filed for this visit.   There is no height or weight on file to calculate BMI.  Physical Exam:   Physical Exam  Assessment and Plan:   @DIAGLIST @    I,Savera Zaman,acting as a scribe for , PA.,have documented all relevant documentation on the behalf of [employer], PA,as directed by  Jarold Motto, PA while in the presence of Jarold Motto, Jarold Motto.   ***  [name], PA-C

## 2021-12-23 ENCOUNTER — Ambulatory Visit (INDEPENDENT_AMBULATORY_CARE_PROVIDER_SITE_OTHER): Payer: Commercial Managed Care - HMO | Admitting: Physician Assistant

## 2021-12-23 ENCOUNTER — Encounter: Payer: Self-pay | Admitting: Physician Assistant

## 2021-12-23 VITALS — BP 140/80 | HR 62 | Temp 97.3°F | Ht 64.0 in | Wt 210.0 lb

## 2021-12-23 DIAGNOSIS — I10 Essential (primary) hypertension: Secondary | ICD-10-CM | POA: Diagnosis not present

## 2021-12-23 DIAGNOSIS — K529 Noninfective gastroenteritis and colitis, unspecified: Secondary | ICD-10-CM | POA: Diagnosis not present

## 2021-12-23 DIAGNOSIS — K4090 Unilateral inguinal hernia, without obstruction or gangrene, not specified as recurrent: Secondary | ICD-10-CM | POA: Diagnosis not present

## 2021-12-23 DIAGNOSIS — R519 Headache, unspecified: Secondary | ICD-10-CM | POA: Diagnosis not present

## 2021-12-23 LAB — SEDIMENTATION RATE: Sed Rate: 9 mm/hr (ref 0–30)

## 2021-12-23 MED ORDER — CARBAMAZEPINE 200 MG PO TABS: ORAL_TABLET | ORAL | 1 refills | Status: DC

## 2021-12-23 NOTE — Patient Instructions (Signed)
It was great to see you!  I think you may be dealing with Trigeminal Neuralgia -- I have sent in medication to start for this. If new/worsening symptoms, please let me know. Read handout at back of this packet for more information.  Please see new surgeon regarding your hernia.  Make sure to ask Dr. Loreta Ave about your recent colitis.  Let's follow-up in 1 month to follow-up with your headaches, sooner if you have concerns.  Take care,  Jarold Motto PA-C

## 2021-12-25 ENCOUNTER — Other Ambulatory Visit: Payer: Self-pay | Admitting: *Deleted

## 2021-12-25 MED ORDER — CARBAMAZEPINE 200 MG PO TABS: ORAL_TABLET | ORAL | 0 refills | Status: DC

## 2022-02-11 ENCOUNTER — Ambulatory Visit (HOSPITAL_COMMUNITY)
Admission: EM | Admit: 2022-02-11 | Discharge: 2022-02-11 | Disposition: A | Discharge status: Home / Self Care | Payer: Commercial Managed Care - HMO | Attending: Family Medicine | Admitting: Family Medicine

## 2022-02-11 ENCOUNTER — Encounter (HOSPITAL_COMMUNITY): Payer: Self-pay

## 2022-02-11 DIAGNOSIS — K047 Periapical abscess without sinus: Secondary | ICD-10-CM | POA: Diagnosis not present

## 2022-02-11 MED ORDER — AMOXICILLIN-POT CLAVULANATE 875-125 MG PO TABS: 1.0000 | ORAL_TABLET | Freq: Two times a day (BID) | ORAL | 0 refills | Status: AC

## 2022-02-11 MED ORDER — KETOROLAC TROMETHAMINE 30 MG/ML IJ SOLN
INTRAMUSCULAR | Status: AC
  Filled 2022-02-11: qty 1

## 2022-02-11 MED ORDER — HYDROCODONE-ACETAMINOPHEN 5-325 MG PO TABS: 2.0000 | ORAL_TABLET | Freq: Four times a day (QID) | ORAL | 0 refills | Status: AC | PRN

## 2022-02-11 MED ORDER — KETOROLAC TROMETHAMINE 60 MG/2ML IM SOLN
60.0000 mg | Freq: Once | INTRAMUSCULAR | Status: AC
  Administered 2022-02-11: 60 mg via INTRAMUSCULAR

## 2022-02-11 NOTE — ED Triage Notes (Signed)
Pt c/o rt lower toothache x2 days. Using otc with no relief.

## 2022-02-11 NOTE — Discharge Instructions (Signed)
You were seen today for dental infection.  I have given you a shot of toradol today for pain.  I have sent out an antibiotic to take for this.  Please take this twice/day with food to avoid upset stomach.  I recommend you take 800mg  motrin/ibuprofen with food as well.   I have given a few days of a pain mediation as well.  See the dentist as already scheduled.

## 2022-02-11 NOTE — ED Provider Notes (Signed)
[facility]    CSN: [id number] Arrival date & time: 02/11/22  1439      History   Chief Complaint Chief Complaint  Patient presents with   Dental Injury    HPI KARN DERK is a 56 y.o. female.   Patient is here for right lower tooth pain x 2 days.  Swelling of the right gums/jaw line.  No fevers/chills.   She has been brushing, mouth wash, and heating pad.   Does have an apt in October for extraction of her teeth.   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   Gastroesophageal reflux disease 09/25/2019   Bariatric surgery status 06/14/2014   Essential hypertension 09/28/2013    Past Surgical History:  Procedure Laterality Date   BARIATRIC SURGERY     BREATH TEK H PYLORI N/A 10/22/2014   Procedure: BREATH TEK H PYLORI;  Surgeon: Gaynelle Adu, MD;  Location: [facility];  Service: General;  Laterality: N/A;   CHOLECYSTECTOMY     HERNIA REPAIR     HIATAL HERNIA REPAIR  01/04/2020   LAPAROSCOPIC GASTRIC SLEEVE RESECTION N/A 04/29/2015   Procedure: LAPAROSCOPIC GASTRIC SLEEVE RESECTION;  Surgeon: Geoffry Paradise, MD;  Location: [facility];  Service: General;  Laterality: N/A;    OB History     Gravida  2   Para  2   Term  2   Preterm  0   AB  0   Living  2      SAB  0   IAB  0   Ectopic  0   Multiple  0   Live Births  2            Home Medications    Prior to Admission medications   Medication Sig Start Date End Date Taking? Authorizing Provider  amoxicillin-clavulanate (AUGMENTIN) 875-125 MG tablet Take 1 tablet by mouth every  12 (twelve) hours. 12/08/21   Marita Kansas, PA-C  carbamazepine (TEGRETOL) 200 MG tablet Take 1/2 tablet (100 mg) twice daily x 1 week. After 1 week, increase to 200 mg twice daily. 12/25/21   Jarold Motto, PA  dicyclomine (BENTYL) 20 MG tablet Take 1 tablet (20 mg total) by mouth 2 (two) times daily. 12/08/21   Marita Kansas, PA-C  furosemide (LASIX) 20 MG tablet Take 1 tablet (20 mg total) by mouth daily as needed. Patient taking differently: Take 20 mg by mouth daily as needed for fluid or edema. 10/02/21   Jarold Motto, PA  Multiple Vitamins-Calcium (ONE-A-DAY WOMENS PO) Take 1 tablet by mouth daily.    [provider]  ondansetron (ZOFRAN-ODT) 4 MG disintegrating tablet Take 1 tablet (4 mg total) by mouth every 8 (eight) hours as needed for nausea or vomiting. 12/08/21   Marita [place], PA-C  pantoprazole (PROTONIX) 40 MG tablet Take 1 tablet (40 mg total) by mouth daily. 10/02/21   Jarold Motto, PA  traZODone (DESYREL) 50 MG tablet Take 1 tablet (50 mg total) by mouth at bedtime as needed for sleep. 10/02/21   Jarold Motto, PA  Family History Family History  Problem Relation Age of Onset   Cancer Mother        breast   Hypertension Mother    Hyperlipidemia Mother    Alcohol abuse Father    Heart disease Brother    Hyperlipidemia Brother    Hypertension Brother     Social History Social History   Tobacco Use   Smoking status: Never   Smokeless tobacco: Never  Vaping Use   Vaping Use: Never used  Substance Use Topics   Alcohol use: Not Currently   Drug use: No     Allergies   Patient has no known allergies.   Review of Systems Review of Systems  Constitutional: Negative.   HENT:  Positive for dental problem and facial swelling.   Respiratory: Negative.    Cardiovascular: Negative.   Gastrointestinal: Negative.   Genitourinary: Negative.   Musculoskeletal: Negative.   Psychiatric/Behavioral: Negative.       Physical Exam Triage Vital Signs ED  Triage Vitals [02/11/22 1454]  Enc Vitals Group     BP (!) 165/71     Pulse Rate 77     Resp 18     Temp 98.7 F (37.1 C)     Temp Source Oral     SpO2 100 %     Weight      Height      Head Circumference      Peak Flow      Pain Score 10     Pain Loc      Pain Edu?      Excl. in GC?    No data found.  Updated Vital Signs BP (!) 165/71 (BP Location: Left Arm)   Pulse 77   Temp 98.7 F (37.1 C) (Oral)   Resp 18   LMP 09/03/2012   SpO2 100%   Visual Acuity Right Eye Distance:   Left Eye Distance:   Bilateral Distance:    Right Eye Near:   Left Eye Near:    Bilateral Near:     Physical Exam Constitutional:      Appearance: Normal appearance.  HENT:     Head:     Comments: Swelling noted to the right lower jaw line;  this is tender to palpation;  Poor dentition noted;  right last molar has filling;  the gum is red, inflamed and tender at this area Cardiovascular:     Rate and Rhythm: Normal rate and regular rhythm.  Pulmonary:     Effort: Pulmonary effort is normal.     Breath sounds: Normal breath sounds.  Musculoskeletal:     Cervical back: Normal range of motion. Tenderness present.  Lymphadenopathy:     Cervical: Cervical adenopathy present.  Neurological:     General: No focal deficit present.     Mental Status: She is alert.  Psychiatric:        Mood and Affect: Mood normal.      UC Treatments / Results  Labs (all labs ordered are listed, but only abnormal results are displayed) Labs Reviewed - No data to display  EKG   Radiology No results found.  Procedures Procedures (including critical care time)  Medications Ordered in UC Medications - No data to display  Initial Impression / Assessment and Plan / UC Course  I have reviewed the triage vital signs and the nursing notes.  Pertinent labs & imaging results that were available during my care of the patient were reviewed by me and considered  in my medical decision making (see chart  for details).    Final Clinical Impressions(s) / UC Diagnoses   Final diagnoses:  Dental infection     Discharge Instructions      You were seen today for dental infection.  I have given you a shot of toradol today for pain.  I have sent out an antibiotic to take for this.  Please take this twice/day with food to avoid upset stomach.  I recommend you take 800mg  motrin/ibuprofen with food as well.   I have given a few days of a pain mediation as well.  See the dentist as already scheduled.     ED Prescriptions     Medication Sig Dispense Auth. Provider   amoxicillin-clavulanate (AUGMENTIN) 875-125 MG tablet Take 1 tablet by mouth every 12 (twelve) hours for 10 days. 20 tablet Norvell Caswell, MD   HYDROcodone-acetaminophen (NORCO/VICODIN) 5-325 MG tablet Take 2 tablets by mouth every 6 (six) hours as needed for up to 2 days. 15 tablet , MD      PDMP not reviewed this encounter.   Jannifer Franklin, MD 02/11/22 1526

## 2022-03-01 ENCOUNTER — Telehealth: Payer: Self-pay | Admitting: Physician Assistant

## 2022-03-01 NOTE — Telephone Encounter (Signed)
Please see message and advise if patient needs an appt?

## 2022-03-01 NOTE — Telephone Encounter (Signed)
Patient requests a new RX for a paste that treats ulcers in Patient's mouth (Patient states this has been prescribed twice before) be sent to:  [facility] [place], [place] Phone:  [phone]  Fax:  [phone]

## 2022-03-02 MED ORDER — TRIAMCINOLONE ACETONIDE 0.1 % MT PSTE: 1.0000 | PASTE | Freq: Two times a day (BID) | OROMUCOSAL | 1 refills | Status: DC

## 2022-03-02 NOTE — Addendum Note (Signed)
Addended by: Marian Sorrow on: 03/02/2022 04:23 PM   Modules accepted: Orders

## 2022-03-02 NOTE — Telephone Encounter (Signed)
Spoke to pt asked her if she remembered the name of the medication that was prescribed? Pt said no. Told her I found Triamcinolone 0.1% paste that was sent in back in 11/2020 for mouth ulcers. Pt said that must be it. Told her I will send Rx, but if symptoms do not improve please call the office and schedule an appt. Pt verbalized understanding. Rx sent.

## 2022-05-26 ENCOUNTER — Ambulatory Visit (INDEPENDENT_AMBULATORY_CARE_PROVIDER_SITE_OTHER): Payer: Self-pay | Admitting: Physician Assistant

## 2022-05-26 ENCOUNTER — Ambulatory Visit (HOSPITAL_COMMUNITY): Payer: Commercial Managed Care - HMO | Attending: Physician Assistant

## 2022-05-26 ENCOUNTER — Encounter: Payer: Self-pay | Admitting: Physician Assistant

## 2022-05-26 VITALS — BP 130/72 | HR 60 | Temp 97.3°F | Ht 64.0 in | Wt 203.2 lb

## 2022-05-26 DIAGNOSIS — J069 Acute upper respiratory infection, unspecified: Secondary | ICD-10-CM

## 2022-05-26 DIAGNOSIS — M79652 Pain in left thigh: Secondary | ICD-10-CM

## 2022-05-26 NOTE — Patient Instructions (Signed)
It was great to see you!  I am placing a stat ultrasound of your legs to be done today Please stay by your phone so someone can call you to schedule this  If you have any new/worsening symptoms, please let us know in the meantime  Take care,  Jarold Motto PA-C

## 2022-05-26 NOTE — Progress Notes (Signed)
Natasha Doyle is a 56 y.o. female here for a new problem.  History of Present Illness:   Chief Complaint  Patient presents with   Nasal Congestion    Pt had drainage x 4 days, but now clear drainage. She was taking Mucinex.   thigh pain    Pt c/o left thigh pain started on Sunday night, feels like it on fire sometimes and radiates all the way down her leg to foot.    HPI  Left leg pain Intermittent, burning pain. Onset suddenly while sitting at [employer] x3 days ago. 10/10 severity when the pain began. Has improved in severity since that time. Lateral left upper leg, intermittently radiates down her leg into her foot. Using Lidocaine patches and Tylenol arthritis with minimal relief.  Denies any new activities or injuries. Denies any use of hormonal supplementation. Denies a sedentary lifestyle or recent immobilizations.  Denies any calf pain. Denies any bruising, leg swelling, redness, or rash. Denies any shortness of breath or chest pain.  Notes that she does not drink enough water. Reports anxiety due to Hx of her mother passing away from blood clot related issues.  Notes an accompanying small cyst on anterior shin. Noticed it x1 month ago. Unchanged since.  Past Medical History:  Diagnosis Date   Diabetes mellitus without complication (HCC)    borderline   GERD (gastroesophageal reflux disease)    Hypertension    Obesity    Vaginal delivery 1989, 1992     Social History   Tobacco Use   Smoking status: Never   Smokeless tobacco: Never  Vaping Use   Vaping Use: Never used  Substance Use Topics   Alcohol use: Not Currently   Drug use: No    Past Surgical History:  Procedure Laterality Date   BARIATRIC SURGERY     BREATH TEK H PYLORI N/A 10/22/2014   Procedure: BREATH TEK H PYLORI;  Surgeon: Gaynelle Adu, MD;  Location: [facility];  Service: General;  Laterality: N/A;   CHOLECYSTECTOMY     HERNIA REPAIR     HIATAL HERNIA REPAIR  01/04/2020   LAPAROSCOPIC  GASTRIC SLEEVE RESECTION N/A 04/29/2015   Procedure: LAPAROSCOPIC GASTRIC SLEEVE RESECTION;  Surgeon: Geoffry Paradise, MD;  Location: [facility];  Service: General;  Laterality: N/A;    Family History  Problem Relation Age of Onset   Cancer Mother        breast   Hypertension Mother    Hyperlipidemia Mother    Alcohol abuse Father    Heart disease Brother    Hyperlipidemia Brother    Hypertension Brother     No Known Allergies  Current Medications:   Current Outpatient Medications:    B Complex Vitamins (B COMPLEX 1 PO), Take 1 tablet by mouth daily in the afternoon., Disp: , Rfl:    dicyclomine (BENTYL) 20 MG tablet, Take 1 tablet (20 mg total) by mouth 2 (two) times daily., Disp: 20 tablet, Rfl: 0   furosemide (LASIX) 20 MG tablet, Take 1 tablet (20 mg total) by mouth daily as needed. (Patient taking differently: Take 20 mg by mouth daily as needed for fluid or edema.), Disp: 90 tablet, Rfl: 0   Multiple Vitamins-Calcium (ONE-A-DAY WOMENS PO), Take 1 tablet by mouth daily., Disp: , Rfl:    ondansetron (ZOFRAN-ODT) 4 MG disintegrating tablet, Take 1 tablet (4 mg total) by mouth every 8 (eight) hours as needed for nausea or vomiting., Disp: 20 tablet, Rfl: 0   pantoprazole (PROTONIX) 40  MG tablet, Take 1 tablet (40 mg total) by mouth daily., Disp: 90 tablet, Rfl: 1   traZODone (DESYREL) 50 MG tablet, Take 1 tablet (50 mg total) by mouth at bedtime as needed for sleep., Disp: 90 tablet, Rfl: 0   Vitamin E 45 MG (100 UNIT) CAPS, Take 1 capsule by mouth daily in the afternoon., Disp: , Rfl:    Review of Systems:   Review of Systems  HENT:  Positive for congestion.   Respiratory:  Negative for shortness of breath.   Cardiovascular:  Negative for chest pain and leg swelling.  Musculoskeletal:  Positive for myalgias (LLE pain). Negative for falls.  Skin:  Negative for rash.       Negative for bruising or redness  Psychiatric/Behavioral:  The patient is nervous/anxious.     Vitals:    Vitals:   05/26/22 0942  BP: 130/72  Pulse: 60  Temp: (!) 97.3 F (36.3 C)  TempSrc: Temporal  SpO2: 99%  Weight: 203 lb 4 oz (92.2 kg)  Height: 5\' 4"  (1.626 m)     Body mass index is 34.89 kg/m.  Physical Exam:   Physical Exam Vitals and nursing note reviewed.  Constitutional:      General: She is not in acute distress.    Appearance: She is well-developed. She is not ill-appearing or toxic-appearing.  Cardiovascular:     Rate and Rhythm: Normal rate and regular rhythm.     Pulses: Normal pulses.     Heart sounds: Normal heart sounds, S1 normal and S2 normal.  Pulmonary:     Effort: Pulmonary effort is normal.     Breath sounds: Normal breath sounds.  Musculoskeletal:     Right lower leg: 1+ Edema present.     Left lower leg: 1+ Edema present.     Comments: Normal ROM of b/l LE Negative Homan's b/l  Left calf is 1.5 cm larger than right calf   Skin:    General: Skin is warm and dry.  Neurological:     Mental Status: She is alert.     GCS: GCS eye subscore is 4. GCS verbal subscore is 5. GCS motor subscore is 6.     Gait: Gait is intact.     Comments: Normal perceived sensation b/l  Psychiatric:        Speech: Speech normal.        Behavior: Behavior normal. Behavior is cooperative.     Assessment and Plan:   Pain of left thigh Possible sciatic pain however due to trace edema and pain, will order u/s to r/o DVT May continue tylenol arthritis Follow-up based on resutls and clinical sx, possible referral to sports medicine for further evaluation  I,Alexis Herring,acting as a scribe for June, PA.,have documented all relevant documentation on the behalf of Jarold Motto, PA,as directed by  Jarold Motto, PA while in the presence of Jarold Motto, Jarold Motto.  I, [name], Jarold Motto, have reviewed all documentation for this visit. The documentation on 05/26/22 for the exam, diagnosis, procedures, and orders are all accurate and complete.   05/28/22, PA-C

## 2022-05-28 ENCOUNTER — Ambulatory Visit (HOSPITAL_COMMUNITY)
Admission: RE | Admit: 2022-05-28 | Payer: Commercial Managed Care - HMO | Source: Ambulatory Visit | Attending: Physician Assistant | Admitting: Physician Assistant

## 2022-05-31 ENCOUNTER — Telehealth (HOSPITAL_COMMUNITY): Payer: Self-pay

## 2022-05-31 NOTE — Telephone Encounter (Signed)
Left message on voicemail to call office.  

## 2022-05-31 NOTE — Telephone Encounter (Signed)
Attempted to contact for the 3ed time about scheudling STAT DVT study requested by Jarold Motto, PA. Unable to reach this morning, this is the third attempted appointment for the patient. Contacting referring office.   Patient previously NO SHOW two appointments on 05/26/22 and 05/28/22, unable to contact this morning.

## 2022-06-16 NOTE — Telephone Encounter (Signed)
Spoke to pt told her calling about DVT study that she did not have done. Told her they contacted [place] to let us know that you No showed for 2 appts 12/13 and 12/15. They tried to contact you a 3rd time, but no answer. Asked pt why she did not go? Pt said she got COVID on 12/10 and had to be out of work for 10 days. She said she will have to check her answering machine cause she does not remember them calling. Pt said she is scheduled for April she thinks from her My Chart. Told pt she needs to contact them and schedule sooner. Pt verbalized understanding.

## 2022-10-06 NOTE — Progress Notes (Signed)
Subjective:    Natasha Doyle is a 57 y.o. female and is here for a comprehensive physical exam.  HPI  Health Maintenance Due  Topic Date Due   PAP SMEAR-Modifier  09/28/2016   MAMMOGRAM  10/31/2021    Acute Concerns: None  Chronic Issues: Insulin resistance HGBA1c at 5.9% in 2022. Not currently taking medications.  GERD Treated with pantoprazole 40 mg daily. This is helping her symptoms without negative side effects.  Obesity She has lost 13 pounds between 12/23/21 and today. Interested in Government social research officer consult.  Health Maintenance: Immunizations -- Due for shingles vaccine. UTD on flu, tetanus vaccines. Colonoscopy -- Last completed 2016. Reports repeat due in 2026. Mammogram -- Reports she had her last screening in 2023. PAP -- Last completed 09/28/13. Negative for intraepithelial lesion or malignancy. Repeat pap taken today. Bone Density -- N/A Diet -- Trying to eat healthy. Exercise -- No regular exercise.  Sleep habits -- Gets at least 6 hours of sleep per night. Mood -- Stable.  UTD with dentist? - UTD UTD with eye doctor? - UTD  Weight history: Wt Readings from Last 10 Encounters:  10/13/22 197 lb (89.4 kg)  05/26/22 203 lb 4 oz (92.2 kg)  12/23/21 210 lb (95.3 kg)  12/08/21 203 lb (92.1 kg)  09/09/21 214 lb 3.2 oz (97.2 kg)  04/20/21 222 lb (100.7 kg)  11/27/20 250 lb (113.4 kg)  07/16/20 246 lb 6.1 oz (111.8 kg)  04/29/20 233 lb (105.7 kg)  03/19/20 193 lb (87.5 kg)   Body mass index is 33.81 kg/m. Patient's last menstrual period was 09/03/2012.  Alcohol use:  reports that she does not currently use alcohol.  Tobacco use:  Tobacco Use: Low Risk  (10/13/2022)   Patient History    Smoking Tobacco Use: Never    Smokeless Tobacco Use: Never    Passive Exposure: Not on file   Eligible for lung cancer screening? No     10/13/2022   10:53 AM  Depression screen PHQ 2/9  Decreased Interest 0  Down, Depressed, Hopeless 0  PHQ - 2 Score 0      Other providers/specialists: Patient Care Team: Jarold Motto, [name] as PCP - General (Physician Assistant)    PMHx, SurgHx, SocialHx, Medications, and Allergies were reviewed in the Visit Navigator and updated as appropriate.   Past Medical History:  Diagnosis Date   Diabetes mellitus without complication (HCC)    borderline   GERD (gastroesophageal reflux disease)    Hypertension    Obesity    Vaginal delivery 1989, 1992     Past Surgical History:  Procedure Laterality Date   BARIATRIC SURGERY     BREATH TEK H PYLORI N/A 10/22/2014   Procedure: BREATH TEK H PYLORI;  Surgeon: Gaynelle Adu, MD;  Location: [facility];  Service: General;  Laterality: N/A;   CHOLECYSTECTOMY     HERNIA REPAIR     HIATAL HERNIA REPAIR  01/04/2020   LAPAROSCOPIC GASTRIC SLEEVE RESECTION N/A 04/29/2015   Procedure: LAPAROSCOPIC GASTRIC SLEEVE RESECTION;  Surgeon: Geoffry Paradise, MD;  Location: [facility];  Service: General;  Laterality: N/A;     Family History  Problem Relation Age of Onset   Cancer Mother        breast   Hypertension Mother    Hyperlipidemia Mother    Alcohol abuse Father    Breast cancer Sister    Heart disease Brother    Hyperlipidemia Brother    Hypertension Brother     Social  History   Tobacco Use   Smoking status: Never   Smokeless tobacco: Never  Vaping Use   Vaping Use: Never used  Substance Use Topics   Alcohol use: Not Currently   Drug use: No    Review of Systems:   Review of Systems  Constitutional:  Negative for chills, fever, malaise/fatigue and weight loss.  HENT:  Negative for hearing loss, sinus pain and sore throat.   Respiratory:  Negative for cough, hemoptysis and shortness of breath.   Cardiovascular:  Negative for chest pain, palpitations, leg swelling and PND.  Gastrointestinal:  Negative for abdominal pain, constipation, diarrhea, heartburn, nausea and vomiting.  Genitourinary:  Negative for dysuria, frequency and urgency.   Musculoskeletal:  Negative for back pain, myalgias and neck pain.  Skin:  Negative for itching.  Neurological:  Negative for dizziness, tingling, seizures and headaches.  Endo/Heme/Allergies:  Negative for polydipsia.  Psychiatric/Behavioral:  Negative for depression. The patient is not nervous/anxious.     Objective:   BP 132/80 (BP Location: Left Arm, Patient Position: Sitting, Cuff Size: Large)   Pulse 62   Temp (!) 97.3 F (36.3 C) (Temporal)   Ht 5\' 4"  (1.626 m)   Wt 197 lb (89.4 kg)   LMP 09/03/2012   SpO2 100%   BMI 33.81 kg/m  Body mass index is 33.81 kg/m.   General Appearance:    Alert, cooperative, no distress, appears stated age  Head:    Normocephalic, without obvious abnormality, atraumatic  Eyes:    PERRL, conjunctiva/corneas clear, EOM's intact, fundi    benign, both eyes  Ears:    Normal TM's and external ear canals, both ears  Nose:   Nares normal, septum midline, mucosa normal, no drainage    or sinus tenderness  Throat:   Lips, mucosa, and tongue normal; teeth and gums normal  Neck:   Supple, symmetrical, trachea midline, no adenopathy;    thyroid:  no enlargement/tenderness/nodules; no carotid   bruit or JVD  Back:     Symmetric, no curvature, ROM normal, no CVA tenderness  Lungs:     Clear to auscultation bilaterally, respirations unlabored  Chest Wall:    No tenderness or deformity   Heart:    Regular rate and rhythm, S1 and S2 normal, no murmur, rub or gallop  Breast Exam:    Deferred  Abdomen:     Soft, non-tender, bowel sounds active all four quadrants,    no masses, no organomegaly  Genitalia:    Normal female without lesion, discharge or tenderness  Extremities:   Extremities normal, atraumatic, no cyanosis or edema  Pulses:   2+ and symmetric all extremities  Skin:   Skin color, texture, turgor normal, no rashes or lesions  Lymph nodes:   Cervical, supraclavicular, and axillary nodes normal  Neurologic:   CNII-XII intact, normal strength,  sensation and reflexes    throughout    Assessment/Plan:   Routine physical examination Today patient counseled on age appropriate routine health concerns for screening and prevention, each reviewed and up to date or declined. Immunizations reviewed and up to date or declined. Labs ordered and reviewed. Risk factors for depression reviewed and negative. Hearing function and visual acuity are intact. ADLs screened and addressed as needed. Functional ability and level of safety reviewed and appropriate. Education, counseling and referrals performed based on assessed risks today. Patient provided with a copy of personalized plan for preventive services.  Encouraged mammogram  History of sleeve gastrectomy; Pannus Update blood work to  assess vitamin deficiencies and iron stores Continue efforts at healthy lifestyle Referral to plastic surgery for pannus  Essential hypertension Normotensive without medication Continue to monitor periodically and follow-up if blood pressures greater than 140/90  Obesity, unspecified classification, unspecified obesity type, unspecified whether serious comorbidity present Continue efforts at weight loss  Pap smear for cervical cancer screening Completed today  Insulin resistance Update A1c and make recommendations accordingly  Gastroesophageal reflux disease, unspecified whether esophagitis present Well-controlled with Protonix 40 mg daily Follow-up with any concerns    I,Alexander Ruley,acting as a scribe for [employer], PA.,have documented all relevant documentation on the behalf of Jarold Motto, PA,as directed by  Jarold Motto, PA while in the presence of Jarold Motto, [name].   I, Jarold Motto, [name], have reviewed all documentation for this visit. The documentation on 10/13/22 for the exam, diagnosis, procedures, and orders are all accurate and complete.    Jarold Motto, PA-C [facility]

## 2022-10-13 ENCOUNTER — Ambulatory Visit (INDEPENDENT_AMBULATORY_CARE_PROVIDER_SITE_OTHER): Payer: 59 | Admitting: Physician Assistant

## 2022-10-13 ENCOUNTER — Other Ambulatory Visit (HOSPITAL_COMMUNITY)
Admission: RE | Admit: 2022-10-13 | Discharge: 2022-10-13 | Disposition: A | Discharge status: Home / Self Care | Payer: 59 | Source: Ambulatory Visit | Attending: Physician Assistant | Admitting: Physician Assistant

## 2022-10-13 ENCOUNTER — Encounter: Payer: Self-pay | Admitting: Physician Assistant

## 2022-10-13 VITALS — BP 132/80 | HR 62 | Temp 97.3°F | Ht 64.0 in | Wt 197.0 lb

## 2022-10-13 DIAGNOSIS — Z124 Encounter for screening for malignant neoplasm of cervix: Secondary | ICD-10-CM | POA: Diagnosis present

## 2022-10-13 DIAGNOSIS — E669 Obesity, unspecified: Secondary | ICD-10-CM | POA: Diagnosis present

## 2022-10-13 DIAGNOSIS — K219 Gastro-esophageal reflux disease without esophagitis: Secondary | ICD-10-CM

## 2022-10-13 DIAGNOSIS — Z0000 Encounter for general adult medical examination without abnormal findings: Secondary | ICD-10-CM

## 2022-10-13 DIAGNOSIS — I10 Essential (primary) hypertension: Secondary | ICD-10-CM | POA: Diagnosis not present

## 2022-10-13 DIAGNOSIS — Z903 Acquired absence of stomach [part of]: Secondary | ICD-10-CM | POA: Diagnosis present

## 2022-10-13 DIAGNOSIS — E88819 Insulin resistance, unspecified: Secondary | ICD-10-CM

## 2022-10-13 DIAGNOSIS — E65 Localized adiposity: Secondary | ICD-10-CM

## 2022-10-13 LAB — IBC + FERRITIN
Ferritin: 33.4 ng/mL (ref 10.0–291.0)
Iron: 76 ug/dL (ref 42–145)
Saturation Ratios: 20.6 % (ref 20.0–50.0)
TIBC: 369.6 ug/dL (ref 250.0–450.0)
Transferrin: 264 mg/dL (ref 212.0–360.0)

## 2022-10-13 LAB — LIPID PANEL
Cholesterol: 133 mg/dL (ref 0–200)
HDL: 40.9 mg/dL (ref >39.00)
LDL Cholesterol: 80 mg/dL (ref 0–99)
NonHDL: 92.09
Total CHOL/HDL Ratio: 3
Triglycerides: 58 mg/dL (ref 0.0–149.0)
VLDL: 11.6 mg/dL (ref 0.0–40.0)

## 2022-10-13 LAB — CBC WITH DIFFERENTIAL/PLATELET
Basophils Absolute: 0 10*3/uL (ref 0.0–0.1)
Basophils Relative: 0.8 % (ref 0.0–3.0)
Eosinophils Absolute: 0.2 10*3/uL (ref 0.0–0.7)
Eosinophils Relative: 3.5 % (ref 0.0–5.0)
HCT: 35.6 % — ABNORMAL LOW (ref 36.0–46.0)
Hemoglobin: 11.5 g/dL — ABNORMAL LOW (ref 12.0–15.0)
Lymphocytes Relative: 50.9 % — ABNORMAL HIGH (ref 12.0–46.0)
Lymphs Abs: 2.9 10*3/uL (ref 0.7–4.0)
MCHC: 32.3 g/dL (ref 30.0–36.0)
MCV: 82.6 fl (ref 78.0–100.0)
Monocytes Absolute: 0.5 10*3/uL (ref 0.1–1.0)
Monocytes Relative: 8.7 % (ref 3.0–12.0)
Neutro Abs: 2 10*3/uL (ref 1.4–7.7)
Neutrophils Relative %: 36.1 % — ABNORMAL LOW (ref 43.0–77.0)
Platelets: 328 10*3/uL (ref 150.0–400.0)
RBC: 4.31 Mil/uL (ref 3.87–5.11)
RDW: 15.5 % (ref 11.5–15.5)
WBC: 5.7 10*3/uL (ref 4.0–10.5)

## 2022-10-13 LAB — HEMOGLOBIN A1C: Hgb A1c MFr Bld: 6 % (ref 4.6–6.5)

## 2022-10-13 LAB — COMPREHENSIVE METABOLIC PANEL
ALT: 38 U/L — ABNORMAL HIGH (ref 0–35)
AST: 37 U/L (ref 0–37)
Albumin: 4 g/dL (ref 3.5–5.2)
Alkaline Phosphatase: 119 U/L — ABNORMAL HIGH (ref 39–117)
BUN: 8 mg/dL (ref 6–23)
CO2: 25 mEq/L (ref 19–32)
Calcium: 9.5 mg/dL (ref 8.4–10.5)
Chloride: 105 mEq/L (ref 96–112)
Creatinine, Ser: 0.79 mg/dL (ref 0.40–1.20)
GFR: 82.89 mL/min (ref >60.00)
Glucose, Bld: 86 mg/dL (ref 70–99)
Potassium: 4 mEq/L (ref 3.5–5.1)
Sodium: 138 mEq/L (ref 135–145)
Total Bilirubin: 0.7 mg/dL (ref 0.2–1.2)
Total Protein: 7.2 g/dL (ref 6.0–8.3)

## 2022-10-13 LAB — VITAMIN B12: Vitamin B-12: 758 pg/mL (ref 211–911)

## 2022-10-13 LAB — VITAMIN D 25 HYDROXY (VIT D DEFICIENCY, FRACTURES): VITD: 18.25 ng/mL — ABNORMAL LOW (ref 30.00–100.00)

## 2022-10-13 NOTE — Patient Instructions (Addendum)
It was great to see you!  I will place referral for Plastic Surgery!  Please go to the lab for blood work.   Our office will call you with your results unless you have chosen to receive results via MyChart.  If your blood work is normal we will follow-up each year for physicals and as scheduled for chronic medical problems.  If anything is abnormal we will treat accordingly and get you in for a follow-up.  Take care,  Lelon Mast

## 2022-10-14 LAB — CYTOLOGY - PAP
Comment: NEGATIVE
Diagnosis: NEGATIVE
High risk HPV: NEGATIVE

## 2022-10-15 ENCOUNTER — Other Ambulatory Visit: Payer: Self-pay | Admitting: Physician Assistant

## 2022-10-15 DIAGNOSIS — R748 Abnormal levels of other serum enzymes: Secondary | ICD-10-CM

## 2022-10-15 MED ORDER — VITAMIN D (ERGOCALCIFEROL) 1.25 MG (50000 UNIT) PO CAPS: 50000.0000 [IU] | ORAL_CAPSULE | ORAL | 0 refills | Status: DC

## 2022-10-20 ENCOUNTER — Telehealth: Payer: Self-pay | Admitting: Physician Assistant

## 2022-10-20 NOTE — Telephone Encounter (Signed)
Pt would like a call back with lab results 

## 2022-10-20 NOTE — Telephone Encounter (Signed)
Spoke to pt asked her how can I help you? Pt said she has been having abdominal pain past few days. Asked pt did you tell them that when you called? Pt said yes. Told her you will need to schedule an appt. Pt scheduled for Thurs 10/21/2022 at 11:20. Pt verbalized understanding. Told pt if pain gets severe need to go to the ED. Pt verbalized understanding.

## 2022-10-21 ENCOUNTER — Encounter: Payer: Self-pay | Admitting: Physician Assistant

## 2022-10-21 ENCOUNTER — Ambulatory Visit: Payer: 59 | Admitting: Physician Assistant

## 2022-10-21 VITALS — BP 140/76 | HR 72 | Temp 97.0°F | Ht 64.0 in | Wt 197.6 lb

## 2022-10-21 DIAGNOSIS — R1030 Lower abdominal pain, unspecified: Secondary | ICD-10-CM | POA: Diagnosis not present

## 2022-10-21 LAB — URINALYSIS, ROUTINE W REFLEX MICROSCOPIC
Bilirubin Urine: NEGATIVE
Hgb urine dipstick: NEGATIVE
Ketones, ur: NEGATIVE
Leukocytes,Ua: NEGATIVE
Nitrite: NEGATIVE
RBC / HPF: NONE SEEN (ref 0–?)
Specific Gravity, Urine: >=1.03 — AB (ref 1.000–1.030)
Total Protein, Urine: NEGATIVE
Urine Glucose: NEGATIVE
Urobilinogen, UA: 0.2 (ref 0.0–1.0)
pH: 6 (ref 5.0–8.0)

## 2022-10-21 MED ORDER — DICYCLOMINE HCL 20 MG PO TABS: 20.0000 mg | ORAL_TABLET | Freq: Two times a day (BID) | ORAL | 0 refills | Status: DC | PRN

## 2022-10-21 NOTE — Progress Notes (Addendum)
Natasha Doyle is a 57 y.o. female here for a new problem.  History of Present Illness:   Chief Complaint  Patient presents with   Abdominal Pain    Pt c/o lower abd pain x2days constinelty but it has subsided today, denies constipation/diarrhea/n/v    HPI  Lower abdominal pain: She complains of lower abdominal pain beginning 2 days ago.  She reports the pain was worse Monday and Tuesday.  She reports the pain was similar to prior episodes, for which she was previously hospitalized and prescribed Hydrocodone and Bentyl. Yesterday she took a Hydrocodone and 2-3 hours later her pain temporarily subsided.   Today she is not having pain.  She has been able to have bowel movements, her last being at 9 am this morning.  Denies any straining or blood in stool. Her last colonoscopy was in 2016 She has significant history of abdominal surgeries.  She has not been taking Protonix daily.  Denies any pelvic pain, constipation, vaginal bleeding or discharge, d/n/v, or constant urinary symptoms.She did have a twinge of urinary discomfort yesterday.   Past Medical History:  Diagnosis Date   Diabetes mellitus without complication (HCC)    borderline   GERD (gastroesophageal reflux disease)    Hypertension    Obesity    Vaginal delivery 1989, 1992     Social History   Tobacco Use   Smoking status: Never   Smokeless tobacco: Never  Vaping Use   Vaping Use: Never used  Substance Use Topics   Alcohol use: Not Currently   Drug use: No    Past Surgical History:  Procedure Laterality Date   BARIATRIC SURGERY     BREATH TEK H PYLORI N/A 10/22/2014   Procedure: BREATH TEK H PYLORI;  Surgeon: Gaynelle Adu, MD;  Location: [facility];  Service: General;  Laterality: N/A;   CHOLECYSTECTOMY     HERNIA REPAIR     HIATAL HERNIA REPAIR  01/04/2020   LAPAROSCOPIC GASTRIC SLEEVE RESECTION N/A 04/29/2015   Procedure: LAPAROSCOPIC GASTRIC SLEEVE RESECTION;  Surgeon: Geoffry Paradise, MD;   Location: [facility];  Service: General;  Laterality: N/A;    Family History  Problem Relation Age of Onset   Cancer Mother        breast   Hypertension Mother    Hyperlipidemia Mother    Alcohol abuse Father    Breast cancer Sister    Heart disease Brother    Hyperlipidemia Brother    Hypertension Brother     No Known Allergies  Current Medications:   Current Outpatient Medications:    B Complex Vitamins (B COMPLEX 1 PO), Take 1 tablet by mouth daily in the afternoon., Disp: , Rfl:    furosemide (LASIX) 20 MG tablet, Take 1 tablet (20 mg total) by mouth daily as needed. (Patient taking differently: Take 20 mg by mouth daily as needed for fluid or edema.), Disp: 90 tablet, Rfl: 0   Multiple Vitamins-Calcium (ONE-A-DAY WOMENS PO), Take 1 tablet by mouth daily., Disp: , Rfl:    pantoprazole (PROTONIX) 40 MG tablet, Take 1 tablet (40 mg total) by mouth daily., Disp: 90 tablet, Rfl: 1   Vitamin D, Ergocalciferol, (DRISDOL) 1.25 MG (50000 UNIT) CAPS capsule, Take 1 capsule (50,000 Units total) by mouth every 7 (seven) days., Disp: 12 capsule, Rfl: 0   Vitamin E 45 MG (100 UNIT) CAPS, Take 1 capsule by mouth daily in the afternoon., Disp: , Rfl:    dicyclomine (BENTYL) 20 MG tablet, Take 1  tablet (20 mg total) by mouth 2 (two) times daily as needed for spasms., Disp: 30 tablet, Rfl: 0   ondansetron (ZOFRAN-ODT) 4 MG disintegrating tablet, Take 1 tablet (4 mg total) by mouth every 8 (eight) hours as needed for nausea or vomiting. (Patient not taking: Reported on 10/21/2022), Disp: 20 tablet, Rfl: 0   Review of Systems:   Review of Systems  Gastrointestinal:  Positive for abdominal pain (lower abdominal pain -- see HPI).    Vitals:   Vitals:   10/21/22 1109  BP: (!) 140/76  Pulse: 72  Temp: (!) 97 F (36.1 C)  SpO2: 99%  Weight: 197 lb 9.6 oz (89.6 kg)  Height: 5\' 4"  (1.626 m)     Body mass index is 33.92 kg/m.  Physical Exam:   Physical Exam Vitals and nursing note  reviewed.  Constitutional:      General: She is not in acute distress.    Appearance: She is well-developed. She is not ill-appearing or toxic-appearing.  Cardiovascular:     Rate and Rhythm: Normal rate and regular rhythm.     Pulses: Normal pulses.     Heart sounds: Normal heart sounds, S1 normal and S2 normal.  Pulmonary:     Effort: Pulmonary effort is normal.     Breath sounds: Normal breath sounds.  Abdominal:     General: Abdomen is flat. Bowel sounds are normal.     Palpations: Abdomen is soft.     Tenderness: There is no abdominal tenderness. There is no guarding or rebound.  Skin:    General: Skin is warm and dry.  Neurological:     Mental Status: She is alert.     GCS: GCS eye subscore is 4. GCS verbal subscore is 5. GCS motor subscore is 6.  Psychiatric:        Speech: Speech normal.        Behavior: Behavior normal. Behavior is cooperative.     Assessment and Plan:   Lower abdominal pain Resolved Suspect possible gas, bowel changes, IBS, or other We discussed getting imaging now vs watchful waiting She would like to wait If pain returns - she will call us and we will order CT abdomen/pelvis with contrast If pain is severe - she will go to the ER Will check UA out of abundance of caution Declines pelvic exam   I,Rachel Rivera,acting as a scribe for Jarold Motto, PA.,have documented all relevant documentation on the behalf of Jarold Motto, PA,as directed by  Jarold Motto, PA while in the presence of Jarold Motto, [name].  I, Jarold Motto, [name], have reviewed all documentation for this visit. The documentation on 10/21/22 for the exam, diagnosis, procedures, and orders are all accurate and complete.   Jarold Motto, PA-C

## 2022-10-21 NOTE — Patient Instructions (Signed)
It was great to see you!  Please call us if abdominal pain returns  If you have severe abdominal pain, please go to the ER  Take care,  Jarold Motto PA-C

## 2022-10-22 LAB — URINE CULTURE
MICRO NUMBER:: 14935681
SPECIMEN QUALITY:: ADEQUATE

## 2022-10-30 ENCOUNTER — Other Ambulatory Visit: Payer: Self-pay | Admitting: Physician Assistant

## 2022-11-18 ENCOUNTER — Ambulatory Visit (INDEPENDENT_AMBULATORY_CARE_PROVIDER_SITE_OTHER): Payer: 59 | Admitting: Plastic Surgery

## 2022-11-18 ENCOUNTER — Encounter: Payer: Self-pay | Admitting: Plastic Surgery

## 2022-11-18 VITALS — BP 156/80 | HR 68 | Ht 64.0 in | Wt 194.0 lb

## 2022-11-18 DIAGNOSIS — Z9884 Bariatric surgery status: Secondary | ICD-10-CM

## 2022-11-18 DIAGNOSIS — M793 Panniculitis, unspecified: Secondary | ICD-10-CM

## 2022-11-18 DIAGNOSIS — Z6833 Body mass index (BMI) 33.0-33.9, adult: Secondary | ICD-10-CM

## 2022-11-18 NOTE — Progress Notes (Signed)
Referring Provider Jarold Motto, PA [address] [place],  [place] [address]   CC:  Chief Complaint  Patient presents with   Consult      Natasha Doyle is an 57 y.o. female.  HPI: Natasha Doyle is a 57 year old female who presents today for evaluation for possible panniculectomy.  Patient has lost in excess of 100 pounds since undergoing a revision of her bariatric surgery in September 2022 and now has excess skin on the anterior abdominal wall which frequently becomes infected and causes her pain.  She relates that most recently she had cracking of the skin which took some time to heal.  She is interested in removal of the skin to treat the ongoing infections and pain  No Known Allergies  Outpatient Encounter Medications as of 11/18/2022  Medication Sig   B Complex Vitamins (B COMPLEX 1 PO) Take 1 tablet by mouth daily in the afternoon.   dicyclomine (BENTYL) 20 MG tablet Take 1 tablet (20 mg total) by mouth 2 (two) times daily as needed for spasms.   Multiple Vitamins-Calcium (ONE-A-DAY WOMENS PO) Take 1 tablet by mouth daily.   ondansetron (ZOFRAN-ODT) 4 MG disintegrating tablet Take 1 tablet (4 mg total) by mouth every 8 (eight) hours as needed for nausea or vomiting.   pantoprazole (PROTONIX) 40 MG tablet Take 1 tablet (40 mg total) by mouth daily.   Vitamin D, Ergocalciferol, (DRISDOL) 1.25 MG (50000 UNIT) CAPS capsule Take 1 capsule (50,000 Units total) by mouth every 7 (seven) days.   Vitamin E 45 MG (100 UNIT) CAPS Take 1 capsule by mouth daily in the afternoon.   furosemide (LASIX) 20 MG tablet Take 1 tablet (20 mg total) by mouth daily as needed. (Patient taking differently: Take 20 mg by mouth daily as needed for fluid or edema.)   No facility-administered encounter medications on file as of 11/18/2022.     Past Medical History:  Diagnosis Date   Diabetes mellitus without complication (HCC)    borderline   GERD (gastroesophageal reflux disease)    Hypertension     Obesity    Vaginal delivery 1989, 1992    Past Surgical History:  Procedure Laterality Date   BARIATRIC SURGERY     BREATH TEK H PYLORI N/A 10/22/2014   Procedure: BREATH TEK H PYLORI;  Surgeon: Gaynelle Adu, MD;  Location: [facility];  Service: General;  Laterality: N/A;   CHOLECYSTECTOMY     HERNIA REPAIR     HIATAL HERNIA REPAIR  01/04/2020   LAPAROSCOPIC GASTRIC SLEEVE RESECTION N/A 04/29/2015   Procedure: LAPAROSCOPIC GASTRIC SLEEVE RESECTION;  Surgeon: Geoffry Paradise, MD;  Location: [facility];  Service: General;  Laterality: N/A;    Family History  Problem Relation Age of Onset   Cancer Mother        breast   Hypertension Mother    Hyperlipidemia Mother    Alcohol abuse Father    Breast cancer Sister    Heart disease Brother    Hyperlipidemia Brother    Hypertension Brother     Social History   Social History Narrative   Agricultural engineer     Review of Systems General: Denies fevers, chills, weight loss CV: Denies chest pain, shortness of breath, palpitations Pannus: Anterior abdominal wall skin which the patient relates frequently becomes infected and painful on the posterior aspect of the pannus and in the intertriginous regions  Physical Exam    11/18/2022    1:31 PM 10/21/2022   11:09  AM 10/13/2022   10:53 AM  Vitals with BMI  Height 5\' 4"  5\' 4"  5\' 4"   Weight 194 lbs 197 lbs 10 oz 197 lbs  BMI 33.28 33.9 33.8  Systolic 156 140 161  Diastolic 80 76 80  Pulse 68 72 62    General:  No acute distress,  Alert and oriented, Non-Toxic, Normal speech and affect Pannus: Patient has a pannus which hangs past the symphysis pubis.  While she has no ongoing infections today she does have evidence of skin discoloration consistent with previous infections. Mammogram: Last mammogram was June 2021 and was BI-RADS 2.  I have placed an order for mammogram if she needs 1. Assessment/Plan Panniculitis: Patient would be a good candidate for a panniculectomy and I believe that it  would help with the treatment of her ongoing infections.  She would also be a good candidate for full abdominoplasty if she was interested but she is only interested in the lower portion at this time.  We discussed the procedure at length including the location of the incisions and the unpredictable nature of scarring.  Patient has several scars on her abdominal wall which are hypertrophic and this gives me concerned that she may develop hypertrophic scars with a panniculectomy.  I discussed this with her and she understands.  She also understands that she will need to refrain from heavy lifting, vigorous activity, and submerging the incisions in water for 6 weeks.  She will need to wear compression for the entire 6 weeks.  She will have 2 drains postoperatively.  Photographs were obtained today with her consent.  All questions answered to her satisfaction.  Will submit a surgical request on her behalf.  Natasha Doyle 11/18/2022, 4:41 PM

## 2022-11-19 ENCOUNTER — Other Ambulatory Visit: Payer: Self-pay | Admitting: Plastic Surgery

## 2022-11-19 ENCOUNTER — Telehealth: Payer: Self-pay

## 2022-11-19 DIAGNOSIS — Z1231 Encounter for screening mammogram for malignant neoplasm of breast: Secondary | ICD-10-CM

## 2022-11-19 NOTE — Telephone Encounter (Signed)
LM regarding mammogram

## 2022-11-22 ENCOUNTER — Telehealth: Payer: Self-pay | Admitting: *Deleted

## 2022-11-22 NOTE — Telephone Encounter (Signed)
Called patient on (11/19/22) regarding the message below.  Patient stated she did not have a Mammogram last year.    Informed the patient that she will need a updated Mammogram.  Asked the patient where does she get her Mammograms done.  [facility]    Informed the patient that Dr. Ladona Ridgel has placed an order for a Mammogram at [facility].  All she will need to do is give the [facility] a call to schedule an appointment.    Patient verbalized understanding and agreed to call the [facility] to schedule an appointment for the Mammogram.//AB/CMA

## 2022-11-22 NOTE — Telephone Encounter (Signed)
-----   Message from Santiago Glad, MD sent at 11/18/2022  4:56 PM EDT ----- Regarding: [facility],  I can't find a mammogram on [facility] [place] since 2021.   Will you please let her know I have placed an order for her if she needs to get one done.  Thank you, Ree Kida

## 2022-11-26 ENCOUNTER — Ambulatory Visit
Admission: RE | Admit: 2022-11-26 | Discharge: 2022-11-26 | Disposition: A | Discharge status: Home / Self Care | Payer: 59 | Source: Ambulatory Visit | Attending: Plastic Surgery | Admitting: Plastic Surgery

## 2022-11-26 DIAGNOSIS — Z1231 Encounter for screening mammogram for malignant neoplasm of breast: Secondary | ICD-10-CM

## 2023-01-14 ENCOUNTER — Telehealth: Payer: Self-pay | Admitting: Plastic Surgery

## 2023-01-14 NOTE — Telephone Encounter (Signed)
Patient saw Dr Ladona Ridgel back on 11/18/22 and was just wondering where she was at on getting scheduled. Please advise. Callback is [phone]

## 2023-01-18 ENCOUNTER — Encounter: Payer: Self-pay | Admitting: *Deleted

## 2023-01-21 ENCOUNTER — Telehealth: Payer: Self-pay | Admitting: Plastic Surgery

## 2023-01-21 NOTE — Telephone Encounter (Signed)
Called pt to see if she wanted to processed with surgery with self pat prices? Left vm

## 2023-04-07 ENCOUNTER — Ambulatory Visit: Payer: 59 | Admitting: Physician Assistant

## 2023-04-07 VITALS — BP 110/72 | HR 64 | Temp 97.1°F | Ht 64.0 in | Wt 191.5 lb

## 2023-04-07 DIAGNOSIS — R1030 Lower abdominal pain, unspecified: Secondary | ICD-10-CM | POA: Diagnosis not present

## 2023-04-07 DIAGNOSIS — M79601 Pain in right arm: Secondary | ICD-10-CM

## 2023-04-07 MED ORDER — ONDANSETRON 4 MG PO TBDP: 4.0000 mg | ORAL_TABLET | Freq: Three times a day (TID) | ORAL | 0 refills | Status: DC | PRN

## 2023-04-07 MED ORDER — DICYCLOMINE HCL 20 MG PO TABS: 20.0000 mg | ORAL_TABLET | Freq: Two times a day (BID) | ORAL | 0 refills | Status: AC | PRN

## 2023-04-07 NOTE — Progress Notes (Signed)
Natasha Doyle is a 57 y.o. female here for a new problem.  History of Present Illness:   Chief Complaint  Patient presents with   Shoulder Pain    Pt c/o right shoulder pain x 1 month off and on.   HPI  Arm Pain (Right): Reports that for the past month she experienced intermittent shoulder pain about 6 times.   Endorses pain with certain motions, remaining the same with every occurrence, pain in the upper shoulder occasionally diffuse down to her elbow, occasional hand numbness (no weakness), neck tension (no pain)  Notes she recently switched mattresses, and does sleep on her right side, occasionally with her arm bent.  She also recently transferred to a new position that is more physically demanding.   Her last mammogram was 11/26/22.  Describes noticing pain when waking up - denies pain waking her up - and lasts about an hour afterwards.   She states she is more concerned with the re-occurrences than pain itself.  States she manages the pain with Ibuprofen.  Denies recent MVC, other trauma, vaccination, noticeable armpit lymph node swelling, limited ROM, or noticeable differences between arms when looking in mirror.   Abdominal Pain: Reports occasionally experiencing spontaneous severe abdominal pain with associated nausea that will last 3-4 hours before subsiding.  States that a hiatal hernia had been discovered years ago when she presented to an ED with similar sx and was prescribed Dicyclomine - requests refill today.  Hx of multiple abdominal surgeries.  Endorses bilateral pain with onset, and pain subsiding after a bowel movement. States she uses 4 mg Zofran when nauseous - requests refill today.  Denies constipation.  Past Medical History:  Diagnosis Date   Diabetes mellitus without complication (HCC)    borderline   GERD (gastroesophageal reflux disease)    Hypertension    Obesity    Vaginal delivery 1989, 1992    Social History   Tobacco Use   Smoking status:  Never   Smokeless tobacco: Never  Vaping Use   Vaping status: Never Used  Substance Use Topics   Alcohol use: Not Currently   Drug use: No   Past Surgical History:  Procedure Laterality Date   BARIATRIC SURGERY     BREATH TEK H PYLORI N/A 10/22/2014   Procedure: BREATH TEK H PYLORI;  Surgeon: Gaynelle Adu, MD;  Location: [facility];  Service: General;  Laterality: N/A;   CHOLECYSTECTOMY     HERNIA REPAIR     HIATAL HERNIA REPAIR  01/04/2020   LAPAROSCOPIC GASTRIC SLEEVE RESECTION N/A 04/29/2015   Procedure: LAPAROSCOPIC GASTRIC SLEEVE RESECTION;  Surgeon: Geoffry Paradise, MD;  Location: [facility];  Service: General;  Laterality: N/A;   Family History  Problem Relation Age of Onset   Cancer Mother        breast   Hypertension Mother    Hyperlipidemia Mother    Alcohol abuse Father    Breast cancer Sister    Heart disease Brother    Hyperlipidemia Brother    Hypertension Brother    No Known Allergies Current Medications:   Current Outpatient Medications:    B Complex Vitamins (B COMPLEX 1 PO), Take 1 tablet by mouth daily in the afternoon., Disp: , Rfl:    Multiple Vitamins-Calcium (ONE-A-DAY WOMENS PO), Take 1 tablet by mouth daily., Disp: , Rfl:    pantoprazole (PROTONIX) 40 MG tablet, Take 1 tablet (40 mg total) by mouth daily., Disp: 90 tablet, Rfl: 1   Vitamin E 45  MG (100 UNIT) CAPS, Take 1 capsule by mouth daily in the afternoon., Disp: , Rfl:    dicyclomine (BENTYL) 20 MG tablet, Take 1 tablet (20 mg total) by mouth 2 (two) times daily as needed for spasms., Disp: 30 tablet, Rfl: 0   ondansetron (ZOFRAN-ODT) 4 MG disintegrating tablet, Take 1 tablet (4 mg total) by mouth every 8 (eight) hours as needed for nausea or vomiting., Disp: 20 tablet, Rfl: 0  Review of Systems:   ROS See pertinent positives and negatives as per the HPI.  Vitals:   Vitals:   04/07/23 0926  BP: 110/72  Pulse: 64  Temp: (!) 97.1 F (36.2 C)  TempSrc: Temporal  SpO2: 99%  Weight: 191 lb 8  oz (86.9 kg)  Height: 5\' 4"  (1.626 m)     Body mass index is 32.87 kg/m.  Physical Exam:   Physical Exam Vitals and nursing note reviewed.  Constitutional:      General: She is not in acute distress.    Appearance: She is well-developed. She is not ill-appearing or toxic-appearing.  Cardiovascular:     Rate and Rhythm: Normal rate and regular rhythm.     Pulses: Normal pulses.     Heart sounds: Normal heart sounds, S1 normal and S2 normal.  Pulmonary:     Effort: Pulmonary effort is normal.     Breath sounds: Normal breath sounds.  Musculoskeletal:     Comments: No tenderness to palpation to right arm No visible abnormalities No issues with range of motion    Skin:    General: Skin is warm and dry.  Neurological:     Mental Status: She is alert.     GCS: GCS eye subscore is 4. GCS verbal subscore is 5. GCS motor subscore is 6.  Psychiatric:        Speech: Speech normal.        Behavior: Behavior normal. Behavior is cooperative.     Assessment and Plan:   Right arm pain No red flags on exam Suspect possible muscle strain Continue to monitor symptom(s), trial tylenol as needed If after two weeks, if no significant change or if any worsening, I have asked her to reach out to let me know and likely send to sports medicine for further evaluation  Lower abdominal pain No red on discussion We will refill her Bentyl to use as needed for spasms Discussed working on avoiding constipation; due to multiple surgeries in her abdomen she likely has significant scar tissue that could be causing issues as her stool moves throughout her colon If any new or worsening symptoms, reach out  [employer] as a scribe for [employer], PA.,have documented all relevant documentation on the behalf of Jarold Motto, PA,as directed by  Jarold Motto, PA while in the presence of Jarold Motto, [name].  I, Jarold Motto, [name], have reviewed all documentation for this visit. The  documentation on 04/07/23 for the exam, diagnosis, procedures, and orders are all accurate and complete.  Jarold Motto, PA-C

## 2023-05-24 ENCOUNTER — Emergency Department (HOSPITAL_COMMUNITY)
Admission: EM | Admit: 2023-05-24 | Discharge: 2023-05-24 | Disposition: A | Discharge status: Home / Self Care | Payer: Self-pay | Attending: Emergency Medicine | Admitting: Emergency Medicine

## 2023-05-24 ENCOUNTER — Emergency Department (HOSPITAL_COMMUNITY): Payer: Self-pay

## 2023-05-24 ENCOUNTER — Encounter (HOSPITAL_COMMUNITY): Payer: Self-pay

## 2023-05-24 DIAGNOSIS — R1031 Right lower quadrant pain: Secondary | ICD-10-CM

## 2023-05-24 DIAGNOSIS — K4030 Unilateral inguinal hernia, with obstruction, without gangrene, not specified as recurrent: Secondary | ICD-10-CM | POA: Insufficient documentation

## 2023-05-24 DIAGNOSIS — E1165 Type 2 diabetes mellitus with hyperglycemia: Secondary | ICD-10-CM | POA: Insufficient documentation

## 2023-05-24 DIAGNOSIS — I10 Essential (primary) hypertension: Secondary | ICD-10-CM | POA: Insufficient documentation

## 2023-05-24 LAB — COMPREHENSIVE METABOLIC PANEL
ALT: 31 U/L (ref 0–44)
AST: 26 U/L (ref 15–41)
Albumin: 3.9 g/dL (ref 3.5–5.0)
Alkaline Phosphatase: 104 U/L (ref 38–126)
Anion gap: 5 (ref 5–15)
BUN: 10 mg/dL (ref 6–20)
CO2: 24 mmol/L (ref 22–32)
Calcium: 9.2 mg/dL (ref 8.9–10.3)
Chloride: 110 mmol/L (ref 98–111)
Creatinine, Ser: 0.81 mg/dL (ref 0.44–1.00)
GFR, Estimated: >60 mL/min (ref >60)
Glucose, Bld: 108 mg/dL — ABNORMAL HIGH (ref 70–99)
Potassium: 4.2 mmol/L (ref 3.5–5.1)
Sodium: 139 mmol/L (ref 135–145)
Total Bilirubin: 0.8 mg/dL (ref <1.2)
Total Protein: 7.5 g/dL (ref 6.5–8.1)

## 2023-05-24 LAB — URINALYSIS, ROUTINE W REFLEX MICROSCOPIC
Bilirubin Urine: NEGATIVE
Glucose, UA: NEGATIVE mg/dL
Hgb urine dipstick: NEGATIVE
Ketones, ur: NEGATIVE mg/dL
Leukocytes,Ua: NEGATIVE
Nitrite: NEGATIVE
Protein, ur: 30 mg/dL — AB
Specific Gravity, Urine: 1.031 — ABNORMAL HIGH (ref 1.005–1.030)
pH: 5 (ref 5.0–8.0)

## 2023-05-24 LAB — CBC
HCT: 36 % (ref 36.0–46.0)
Hemoglobin: 11.5 g/dL — ABNORMAL LOW (ref 12.0–15.0)
MCH: 27 pg (ref 26.0–34.0)
MCHC: 31.9 g/dL (ref 30.0–36.0)
MCV: 84.5 fL (ref 80.0–100.0)
Platelets: 298 10*3/uL (ref 150–400)
RBC: 4.26 MIL/uL (ref 3.87–5.11)
RDW: 15 % (ref 11.5–15.5)
WBC: 7.2 10*3/uL (ref 4.0–10.5)
nRBC: 0 % (ref 0.0–0.2)

## 2023-05-24 LAB — LIPASE, BLOOD: Lipase: 33 U/L (ref 11–51)

## 2023-05-24 MED ORDER — ONDANSETRON 4 MG PO TBDP: 4.0000 mg | ORAL_TABLET | Freq: Three times a day (TID) | ORAL | 0 refills | Status: AC | PRN

## 2023-05-24 MED ORDER — HYDROCODONE-ACETAMINOPHEN 5-325 MG PO TABS: 1.0000 | ORAL_TABLET | Freq: Four times a day (QID) | ORAL | 0 refills | Status: DC | PRN

## 2023-05-24 MED ORDER — MORPHINE SULFATE (PF) 4 MG/ML IV SOLN
4.0000 mg | Freq: Once | INTRAVENOUS | Status: AC
  Administered 2023-05-24: 4 mg via INTRAVENOUS
  Filled 2023-05-24: qty 1

## 2023-05-24 MED ORDER — IOHEXOL 350 MG/ML SOLN
75.0000 mL | Freq: Once | INTRAVENOUS | Status: AC | PRN
  Administered 2023-05-24: 75 mL via INTRAVENOUS

## 2023-05-24 MED ORDER — ONDANSETRON HCL 4 MG/2ML IJ SOLN
4.0000 mg | Freq: Once | INTRAMUSCULAR | Status: AC
  Administered 2023-05-24: 4 mg via INTRAVENOUS
  Filled 2023-05-24: qty 2

## 2023-05-24 NOTE — Discharge Instructions (Signed)
You have been seen today for your complaint of abdominal pain. Your lab work was reassuring. Your imaging showed a hernia in your right lower abdomen that contains fat only. Your discharge medications include Vicodin. This is an opioid pain medication. You should only take this medication as needed for severe pain. You should not drive, operate heavy machinery or make important decisions while taking this medication. You should use alternative methods for pain relief while taking this medication including stretching, gentle range of motion, and alternating tylenol and ibuprofen. Follow up with: Dr. Dossie Der.  He is a Development worker, international aid.  Call his office tomorrow to schedule follow-up appointment regarding hernia repair Please seek immediate medical care if you develop any of the following symptoms: You have belly pain that gets worse. Your hernia: Changes in shape or size. Changes color. Feels hard or hurts when you touch it. At this time there does not appear to be the presence of an emergent medical condition, however there is always the potential for conditions to change. Please read and follow the below instructions.  Do not take your medicine if  develop an itchy rash, swelling in your mouth or lips, or difficulty breathing; call 911 and seek immediate emergency medical attention if this occurs.  You may review your lab tests and imaging results in their entirety on your MyChart account.  Please discuss all results of fully with your primary care provider and other specialist at your follow-up visit.  Note: Portions of this text may have been transcribed using voice recognition software. Every effort was made to ensure accuracy; however, inadvertent computerized transcription errors may still be present.

## 2023-05-24 NOTE — ED Notes (Signed)
Patient transported to CT 

## 2023-05-24 NOTE — ED Provider Notes (Signed)
[facility] AT [facility] Provider Note   CSN: [id number] Arrival date & time: 05/24/23  1241     History  Chief Complaint  Patient presents with   Abdominal Pain    Natasha Doyle is a 57 y.o. female.  With history of hypertension, type 2 diabetes, GERD presenting to the ED for evaluation of lower abdominal pain.  Pain is localized to the right lower quadrant with some radiation to the epigastric region.  It is described as a constant ache.  She reports 2 episodes of vomiting secondary to the pain.  She otherwise denies any nausea.  No hematemesis.  She states she believed she developed a viral gastroenteritis and had abdominal pain and diarrhea on Sunday.  Her symptoms improved yesterday.  Her symptoms then returned this morning.  She denies any fevers.  No chest pain or shortness of breath.  No dysuria, frequency, urgency, hematuria.  No melena or hematochezia.  Reports her stools are looser than normal but denies any watery stools.  She is postmenopausal.  She reports a history of gastric sleeve surgery.   Abdominal Pain Associated symptoms: vomiting        Home Medications Prior to Admission medications   Medication Sig Start Date End Date Taking? Authorizing Provider  HYDROcodone-acetaminophen (NORCO/VICODIN) 5-325 MG tablet Take 1 tablet by mouth every 6 (six) hours as needed. 05/24/23  Yes Daylyn Azbill, Edsel Petrin, PA-C  B Complex Vitamins (B COMPLEX 1 PO) Take 1 tablet by mouth daily in the afternoon.    [provider]  dicyclomine (BENTYL) 20 MG tablet Take 1 tablet (20 mg total) by mouth 2 (two) times daily as needed for spasms. 04/07/23   Jarold Motto, PA  Multiple Vitamins-Calcium (ONE-A-DAY WOMENS PO) Take 1 tablet by mouth daily.    [provider]  ondansetron (ZOFRAN-ODT) 4 MG disintegrating tablet Take 1 tablet (4 mg total) by mouth every 8 (eight) hours as needed for nausea or vomiting. 05/24/23   Jasmyne Lodato, Edsel Petrin, PA-C  pantoprazole (PROTONIX) 40 MG tablet Take 1 tablet (40 mg total) by mouth daily. 10/02/21   Jarold Motto, PA  Vitamin E 45 MG (100 UNIT) CAPS Take 1 capsule by mouth daily in the afternoon.    [provider]      Allergies    Patient has no known allergies.    Review of Systems   Review of Systems  Gastrointestinal:  Positive for abdominal pain and vomiting.  All other systems reviewed and are negative.   Physical Exam Updated Vital Signs BP (!) 156/70   Pulse 70   Temp 98.9 F (37.2 C) (Oral)   Resp 18   LMP 09/03/2012   SpO2 100%  Physical Exam Vitals and nursing note reviewed.  Constitutional:      General: She is not in acute distress.    Appearance: She is well-developed.     Comments: Resting comfortably in bed  HENT:     Head: Normocephalic and atraumatic.  Eyes:     Conjunctiva/sclera: Conjunctivae normal.  Cardiovascular:     Rate and Rhythm: Normal rate and regular rhythm.     Heart sounds: No murmur heard. Pulmonary:     Effort: Pulmonary effort is normal. No respiratory distress.     Breath sounds: Normal breath sounds.  Abdominal:     Palpations: Abdomen is soft.     Tenderness: There is abdominal tenderness in the right lower quadrant. There is no right CVA tenderness,  left CVA tenderness or guarding. Positive signs include McBurney's sign. Negative signs include psoas sign and obturator sign.  Musculoskeletal:        General: No swelling.     Cervical back: Neck supple.  Skin:    General: Skin is warm and dry.     Capillary Refill: Capillary refill takes less than 2 seconds.  Neurological:     Mental Status: She is alert.  Psychiatric:        Mood and Affect: Mood normal.     ED Results / Procedures / Treatments   Labs (all labs ordered are listed, but only abnormal results are displayed) Labs Reviewed  COMPREHENSIVE METABOLIC PANEL - Abnormal; Notable for the following components:      Result Value   Glucose, Bld 108  (*)    All other components within normal limits  CBC - Abnormal; Notable for the following components:   Hemoglobin 11.5 (*)    All other components within normal limits  URINALYSIS, ROUTINE W REFLEX MICROSCOPIC - Abnormal; Notable for the following components:   APPearance HAZY (*)    Specific Gravity, Urine 1.031 (*)    Protein, ur 30 (*)    Bacteria, UA RARE (*)    All other components within normal limits  LIPASE, BLOOD    EKG None  Radiology CT ABDOMEN PELVIS W CONTRAST  Result Date: 05/24/2023 CLINICAL DATA:  Right lower quadrant abdominal pain. EXAM: CT ABDOMEN AND PELVIS WITH CONTRAST TECHNIQUE: Multidetector CT imaging of the abdomen and pelvis was performed using the standard protocol following bolus administration of intravenous contrast. RADIATION DOSE REDUCTION: This exam was performed according to the departmental dose-optimization program which includes automated exposure control, adjustment of the mA and/or kV according to patient size and/or use of iterative reconstruction technique. CONTRAST:  75mL OMNIPAQUE IOHEXOL 350 MG/ML SOLN COMPARISON:  CT abdomen pelvis dated 12/08/2021. FINDINGS: Lower chest: The visualized lung bases are clear. No intra-abdominal free air.  Trace free fluid in the pelvis. Hepatobiliary: Fatty liver. There is mild biliary dilatation, post cholecystectomy. No retained calcified stone noted in the central CBD. Pancreas: Unremarkable. No pancreatic ductal dilatation or surrounding inflammatory changes. Spleen: Normal in size without focal abnormality. Adrenals/Urinary Tract: The adrenal glands are unremarkable. There is no hydronephrosis on either side. There is symmetric enhancement and excretion of contrast by both kidneys. The visualized ureters and urinary bladder appear unremarkable. Stomach/Bowel: There is distal colonic diverticulosis without active inflammatory changes. There is postsurgical changes of the bowel with multiple anastomotic staple  lines. There is no bowel obstruction or active inflammation. The appendix is normal. Vascular/Lymphatic: Mild atherosclerotic calcification of the abdominal aorta. The IVC is unremarkable. No portal venous gas. There is no adenopathy. Reproductive: The uterus is anteverted. Multiple uterine fibroids noted. No suspicious adnexal masses. Other: Increase in the size of the hernia in the right groin with stranding of the herniated fat and increased fluid within the hernia. Findings suspicious for strangulation or incarceration of herniated fat. No bowel herniation. Clinical correlation and surgical consult is advised. Musculoskeletal: No acute osseous pathology. Chronic T10 compression fracture. IMPRESSION: 1. Increase in the size of the hernia in the right groin likely representing a strangulated/incarcerated fat containing hernia. No bowel herniation. 2. Distal colonic diverticulosis. No bowel obstruction. Normal appendix. 3. Fatty liver. 4.  Aortic Atherosclerosis (ICD10-I70.0). Electronically Signed   By: Elgie Collard M.D.   On: 05/24/2023 21:24    Procedures Procedures    Medications Ordered in ED Medications  morphine (PF) 4 MG/ML injection 4 mg (4 mg Intravenous Given 05/24/23 1955)  ondansetron (ZOFRAN) injection 4 mg (4 mg Intravenous Given 05/24/23 1954)  iohexol (OMNIPAQUE) 350 MG/ML injection 75 mL (75 mLs Intravenous Contrast Given 05/24/23 2111)    ED Course/ Medical Decision Making/ A&P Clinical Course as of 05/24/23 2213  Tue May 24, 2023  2139 Consulted with general surgery Dr. Dossie Der who informs that she may follow-up as an outpatient and schedule hernia repair at her convenience as the hernia is only fat-containing. [AS]    Clinical Course User Index [AS] Lula Olszewski Edsel Petrin, PA-C                                 Medical Decision Making Amount and/or Complexity of Data Reviewed Labs: ordered. Radiology: ordered.  Risk Prescription drug management.  This patient  presents to the ED for concern of abdominal pain, this involves an extensive number of treatment options, and is a complaint that carries with it a high risk of complications and morbidity. Differential diagnosis of her lower abdominal considerations include pelvic inflammatory disease, ectopic pregnancy, appendicitis, urinary calculi, primary dysmenorrhea, septic abortion, ruptured ovarian cyst or tumor, ovarian torsion, tubo-ovarian abscess, degeneration of fibroid, endometriosis, diverticulitis, cystitis.   My initial workup includes labs, imaging, pain control  Additional history obtained from: Nursing notes from this visit.  I ordered, reviewed and interpreted labs which include: CBC, CMP, lipase, urinalysis.  Anemia with a hemoglobin of 11.5.  Hyperglycemia of 108.  No leukocytosis.  No significant electrolyte derangement or kidney dysfunction.  Urine without evidence of infection  I ordered imaging studies including CT abdomen pelvis I independently visualized and interpreted imaging which showed incarcerated versus strangulated fat-containing right inguinal hernia. I agree with the radiologist interpretation  Consultations Obtained:  I requested consultation with general surgery Dr. Dossie Der,  and discussed lab and imaging findings as well as pertinent plan - they recommend: Pain management, follow-up in office  Afebrile, hypertensive but otherwise hemodynamically stable.  57 year old female presenting for evaluation of right lower quadrant abdominal pain that became worse this morning.  She is tolerating oral intake without difficulty.  She is having normal bowel movements.  Initially had some tenderness to palpation on exam.  Lab workup was reassuring.  No leukocytosis.  CT abdomen pelvis concerning for strangulated versus incarcerated right lower abdominal hernia.  Patient reported full resolution of her pain in the emergency department.  No longer had any abdominal tenderness to  palpation.  Consulted with Dr. Dossie Der who informs that she is stable for discharge with outpatient follow-up if her pain is well-managed, she is having bowel movements and tolerating oral intake.  Discussed this plan with the patient who is in agreement.  She would prefer discharge home.  She was given a short course of Vicodin and educated on potential side effects.  She was given return precautions.  Stable at discharge.  At this time there does not appear to be any evidence of an acute emergency medical condition and the patient appears stable for discharge with appropriate outpatient follow up. Diagnosis was discussed with patient who verbalizes understanding of care plan and is agreeable to discharge. I have discussed return precautions with patient who verbalizes understanding. Patient encouraged to follow-up with general surgery. All questions answered.  Patient's case discussed with Dr. Renaye Rakers who agrees with plan to discharge with follow-up.   Note: Portions of this report may have  been transcribed using voice recognition software. Every effort was made to ensure accuracy; however, inadvertent computerized transcription errors may still be present.         Final Clinical Impression(s) / ED Diagnoses Final diagnoses:  Incarcerated inguinal hernia  Right lower quadrant abdominal pain    Rx / DC Orders ED Discharge Orders          Ordered    HYDROcodone-acetaminophen (NORCO/VICODIN) 5-325 MG tablet  Every 6 hours PRN        05/24/23 2207    ondansetron (ZOFRAN-ODT) 4 MG disintegrating tablet  Every 8 hours PRN        05/24/23 2213              Mora Bellman 05/24/23 2213    Terald Sleeper, MD 05/25/23 1745

## 2023-05-24 NOTE — ED Triage Notes (Signed)
Pt is coming in with lower abd pain that started today, she states leading up to this she had some GI upset/issues this weekend with diarrhea. She mentions being a biatric surgery patient as well. With the lower abd pain she is having today she has had some nausea/vomiting. No chest pain, no shortness of breath and she is otherwise stable at this time.

## 2023-09-18 ENCOUNTER — Ambulatory Visit (HOSPITAL_COMMUNITY)
Admission: EM | Admit: 2023-09-18 | Discharge: 2023-09-18 | Disposition: A | Discharge status: Home / Self Care | Payer: Self-pay | Attending: Internal Medicine | Admitting: Internal Medicine

## 2023-09-18 ENCOUNTER — Encounter (HOSPITAL_COMMUNITY): Payer: Self-pay

## 2023-09-18 DIAGNOSIS — K047 Periapical abscess without sinus: Secondary | ICD-10-CM

## 2023-09-18 MED ORDER — AMOXICILLIN-POT CLAVULANATE 875-125 MG PO TABS: 1.0000 | ORAL_TABLET | Freq: Two times a day (BID) | ORAL | 0 refills | Status: DC

## 2023-09-18 MED ORDER — ACETAMINOPHEN-CODEINE 300-30 MG PO TABS: 1.0000 | ORAL_TABLET | Freq: Four times a day (QID) | ORAL | 0 refills | Status: AC | PRN
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
## 2023-09-18 NOTE — ED Triage Notes (Signed)
 Patient c/o right lower dental pain and swelling x 3 days. Patient states she has an appointment with a dentis in 4 days.  Patient states she has been taking Tylenol and the last dose was at 0530 this AM.

## 2023-09-18 NOTE — Discharge Instructions (Addendum)
 Dental abscess and infection on the right lower jaw. There is also a small ulceration on the gum line. We will treat with the following:  Augmentin 875 mg twice daily for 10 days.  This is an antibiotic.  Take this with food. Tylenol #3 1-2 tablets every 6 hours as needed for severe pain. Do not take regular tylenol while taking this pain medication. Use caution as this medication can cause drowsiness.  Keep appointment with dentist as scheduled Return to urgent care or PCP if symptoms worsen or fail to resolve.

## 2023-09-27 ENCOUNTER — Ambulatory Visit (HOSPITAL_COMMUNITY): Payer: Self-pay

## 2023-10-05 ENCOUNTER — Ambulatory Visit
Admission: RE | Admit: 2023-10-05 | Discharge: 2023-10-05 | Disposition: A | Discharge status: Home / Self Care | Source: Ambulatory Visit | Attending: Physician Assistant | Admitting: Physician Assistant

## 2023-10-05 ENCOUNTER — Other Ambulatory Visit: Payer: Self-pay

## 2023-10-05 VITALS — BP 144/78 | HR 72 | Temp 99.7°F | Resp 18 | Ht 63.5 in | Wt 179.0 lb

## 2023-10-05 DIAGNOSIS — J069 Acute upper respiratory infection, unspecified: Secondary | ICD-10-CM

## 2023-10-05 DIAGNOSIS — K047 Periapical abscess without sinus: Secondary | ICD-10-CM

## 2023-10-05 LAB — POC COVID19/FLU A&B COMBO
Covid Antigen, POC: NEGATIVE
Influenza A Antigen, POC: NEGATIVE
Influenza B Antigen, POC: NEGATIVE

## 2023-10-05 NOTE — Discharge Instructions (Signed)
 You are seen today for concerns of generalized bodyaches, fatigue, coughing, sinus congestion and nausea.  At this time I suspect that you have likely contracted a viral upper respiratory tract infection also known as a cough or cold.  However given the fact that you are being treated for dental infection I am concerned for potential sepsis as the symptoms you are having plus a recent severe infection can be signs of a more severe illness.  I have ordered blood work to check and help us  rule out such as severe infection.  At this time I recommend you continue taking your antibiotics as directed.  Please make sure you finish the entire course and follow-up with your dentist further recommendations. Your flu and COVID test were negative.  At this time I recommend taking over-the-counter medications to assist with your symptoms  Symptoms can last for 3-10 days with lingering cough and intermittent symptoms lasting weeks after that.  The goal of treatment at this time is to reduce your symptoms and discomfort   I recommend using Robitussin and Mucinex (regular formulations, nothing with decongestants or DM)  You can also use Tylenol  for body aches and fever reduction I also recommend adding an antihistamine to your daily regimen This includes medications like Claritin, Allegra, Zyrtec- the generics of these work very well and are usually less expensive I recommend using Flonase nasal spray - 2 puffs twice per day to help with your nasal congestion The antihistamines and Flonase can take a few weeks to provide significant relief from allergy symptoms but should start to provide some benefit soon. You can use a humidifier at night to help with preventing nasal dryness and irritation   If your symptoms are not improving or seem to be getting worse over the next 5 to 7 days you can always return here to urgent care or you can follow-up with your primary care provider for ongoing management Go to the ER if  you begin to have more serious symptoms such as shortness of breath, trouble breathing, loss of consciousness, swelling around the eyes, high fever, severe lasting headaches, vision changes or neck pain/stiffness.

## 2023-10-05 NOTE — ED Triage Notes (Addendum)
 Pt presents with complaints of cough, fevers, chills, and generalized body aches that began yesterday 4/22. Pt is also reporting an upset stomach and decrease in appetite. Pt currently rates her overall pain a 7/10. OTC Robitussin taken with little improvement in symptoms reported. Tylenol  + Ibuprofen  taken as well.

## 2023-10-05 NOTE — ED Provider Notes (Signed)
 [facility]    CSN: [id number] Arrival date & time: 10/05/23  1039      History   Chief Complaint Chief Complaint  Patient presents with   Chills   Generalized Body Aches    HPI Natasha Doyle is a 58 y.o. female.   HPI  She reports body aches, fatigue, chills, headaches since yesterday  She reports her head feels like it is throbbing when she is walking   She states he has been running fever - tmax of 101  She reports she works in [employer] so she is not sure if she has had recent sick contacts Interventions: Robitussin, Tylenol  last night    She is taking clindamycin  for dental infection- states she is scheduled for return visit with dentist next week She states she is on last pill today   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   Gastroesophageal reflux disease 09/25/2019   Essential hypertension 09/28/2013    Past Surgical History:  Procedure Laterality Date   BARIATRIC SURGERY     BREATH TEK H PYLORI N/A 10/22/2014   Procedure: BREATH TEK H PYLORI;  Surgeon: Aldean Hummingbird, MD;  Location: [facility];  Service: General;  Laterality: N/A;   CHOLECYSTECTOMY     HERNIA REPAIR     HIATAL HERNIA REPAIR  01/04/2020   LAPAROSCOPIC GASTRIC SLEEVE RESECTION N/A 04/29/2015   Procedure: LAPAROSCOPIC GASTRIC SLEEVE RESECTION;  Surgeon: Baldomero Levans, MD;  Location: [facility];  Service: General;  Laterality: N/A;    OB History     Gravida  2   Para  2   Term  2   Preterm  0   AB  0   Living  2      SAB  0   IAB  0   Ectopic  0   Multiple   0   Live Births  2            Home Medications    Prior to Admission medications   Medication Sig Start Date End Date Taking? Authorizing Provider  acetaminophen -codeine  (TYLENOL  #3) 300-30 MG tablet Take 1-2 tablets by mouth every 6 (six) hours as needed for moderate pain (pain score 4-6). 09/18/23   Kreg Pesa, PA-C  amoxicillin -clavulanate (AUGMENTIN ) 875-125 MG tablet Take 1 tablet by mouth every 12 (twelve) hours. 09/18/23   White, Elizabeth A, PA-C  B Complex Vitamins (B COMPLEX 1 PO) Take 1 tablet by mouth daily in the afternoon.    [provider]  dicyclomine  (BENTYL ) 20 MG tablet Take 1 tablet (20 mg total) by mouth 2 (two) times daily as needed for spasms. 04/07/23   Alexander Iba, PA  Multiple Vitamins-Calcium (ONE-A-DAY WOMENS PO) Take 1 tablet by mouth daily.    [provider]  ondansetron  (ZOFRAN -ODT) 4 MG disintegrating tablet Take 1 tablet (4 mg total) by mouth every 8 (eight) hours as needed for nausea or vomiting. 05/24/23   Schutt, Coni Deep, PA-C  pantoprazole  (PROTONIX ) 40 MG tablet Take 1 tablet (40 mg total) by  mouth daily. 10/02/21   Alexander Iba, PA  Vitamin E 45 MG (100 UNIT) CAPS Take 1 capsule by mouth daily in the afternoon.    [provider]    Family History Family History  Problem Relation Age of Onset   Cancer Mother        breast   Hypertension Mother    Hyperlipidemia Mother    Alcohol abuse Father    Breast cancer Sister    Heart disease Brother    Hyperlipidemia Brother    Hypertension Brother     Social History Social History   Tobacco Use   Smoking status: Never   Smokeless tobacco: Never  Vaping Use   Vaping status: Never Used  Substance Use Topics   Alcohol use: Not Currently   Drug use: No     Allergies   Patient has no known allergies.   Review of Systems Review of Systems  Constitutional:  Positive for chills, fatigue and fever.  HENT:  Positive for congestion (had this  last night). Negative for ear pain, postnasal drip, rhinorrhea and sore throat.   Respiratory:  Positive for cough. Negative for shortness of breath and wheezing.   Gastrointestinal:  Positive for nausea. Negative for abdominal pain, diarrhea and vomiting.  Musculoskeletal:  Positive for myalgias.  Neurological:  Positive for headaches.     Physical Exam Triage Vital Signs ED Triage Vitals  Encounter Vitals Group     BP 10/05/23 1056 (!) 144/78     Systolic BP Percentile --      Diastolic BP Percentile --      Pulse Rate 10/05/23 1056 72     Resp 10/05/23 1056 18     Temp 10/05/23 1056 99.7 F (37.6 C)     Temp Source 10/05/23 1056 Tympanic     SpO2 10/05/23 1056 98 %     Weight 10/05/23 1056 179 lb (81.2 kg)     Height 10/05/23 1056 5' 3.5" (1.613 m)     Head Circumference --      Peak Flow --      Pain Score 10/05/23 1118 7     Pain Loc --      Pain Education --      Exclude from Growth Chart --    No data found.  Updated Vital Signs BP (!) 144/78 (BP Location: Right Arm)   Pulse 72   Temp 99.7 F (37.6 C) (Tympanic)   Resp 18   Ht 5' 3.5" (1.613 m)   Wt 179 lb (81.2 kg)   LMP 09/03/2012   SpO2 98%   BMI 31.21 kg/m   Visual Acuity Right Eye Distance:   Left Eye Distance:   Bilateral Distance:    Right Eye Near:   Left Eye Near:    Bilateral Near:     Physical Exam Vitals reviewed.  Constitutional:      General: She is awake.     Appearance: Normal appearance. She is well-developed and well-groomed. She is ill-appearing.  HENT:     Head: Normocephalic and atraumatic.     Right Ear: Hearing, tympanic membrane and ear canal normal.     Left Ear: Hearing, tympanic membrane and ear canal normal.     Mouth/Throat:     Lips: Pink.     Mouth: Mucous membranes are moist.     Dentition: Dental tenderness and dental caries present. No gingival swelling, dental abscesses or gum lesions.     Pharynx: Oropharynx is clear. Uvula  midline. No pharyngeal swelling,  oropharyngeal exudate, posterior oropharyngeal erythema, uvula swelling or postnasal drip.     Tonsils: No tonsillar exudate.  Cardiovascular:     Rate and Rhythm: Normal rate and regular rhythm.     Pulses: Normal pulses.          Radial pulses are 2+ on the right side and 2+ on the left side.     Heart sounds: Normal heart sounds. No murmur heard.    No friction rub. No gallop.  Pulmonary:     Effort: Pulmonary effort is normal.     Breath sounds: Normal breath sounds. No decreased air movement. No decreased breath sounds, wheezing, rhonchi or rales.  Musculoskeletal:     Cervical back: Normal range of motion and neck supple.  Lymphadenopathy:     Head:     Right side of head: No submental, submandibular or preauricular adenopathy.     Left side of head: No submental, submandibular or preauricular adenopathy.     Cervical:     Right cervical: No superficial cervical adenopathy.    Left cervical: No superficial cervical adenopathy.     Upper Body:     Right upper body: No supraclavicular adenopathy.     Left upper body: No supraclavicular adenopathy.  Skin:    General: Skin is warm and dry.  Neurological:     General: No focal deficit present.     Mental Status: She is alert and oriented to person, place, and time.  Psychiatric:        Mood and Affect: Mood normal.        Behavior: Behavior normal. Behavior is cooperative.        Thought Content: Thought content normal.        Judgment: Judgment normal.      UC Treatments / Results  Labs (all labs ordered are listed, but only abnormal results are displayed) Labs Reviewed  CBC WITH DIFFERENTIAL/PLATELET  COMPREHENSIVE METABOLIC PANEL WITH GFR  POC COVID19/FLU A&B COMBO    EKG   Radiology No results found.  Procedures Procedures (including critical care time)  Medications Ordered in UC Medications - No data to display  Initial Impression / Assessment and Plan / UC Course  I have reviewed the triage vital  signs and the nursing notes.  Pertinent labs & imaging results that were available during my care of the patient were reviewed by me and considered in my medical decision making (see chart for details).      Final Clinical Impressions(s) / UC Diagnoses   Final diagnoses:  Upper respiratory tract infection, unspecified type  Dental infection   Patient presents today for concerns for generalized body aches, fatigue, coughing, sinus congestion and nausea.  She reports that her symptoms started yesterday.  She states that she is taking Robitussin and Tylenol  last night with limited relief.  Of note patient is currently on antibiotic regimen for dental infection from her dentist.  She states that she is taking clindamycin  and is currently finishing up this course.  She was previously on Augmentin  at the start of the month for same concern.  Given recent antibiotic use I am less suspicious for potential bacterial sinus infection.  Her symptoms could potentially correlate with potential systemic infection from her dental abscess so I am getting CBC and CMP for further rule out.  For now recommend over-the-counter medications to assist with symptomatic relief.  ED return precautions were also reviewed and provided in after visit summary.  Follow-up as needed    Discharge Instructions      You are seen today for concerns of generalized bodyaches, fatigue, coughing, sinus congestion and nausea.  At this time I suspect that you have likely contracted a viral upper respiratory tract infection also known as a cough or cold.  However given the fact that you are being treated for dental infection I am concerned for potential sepsis as the symptoms you are having plus a recent severe infection can be signs of a more severe illness.  I have ordered blood work to check and help us  rule out such as severe infection.  At this time I recommend you continue taking your antibiotics as directed.  Please make sure you  finish the entire course and follow-up with your dentist further recommendations. Your flu and COVID test were negative.  At this time I recommend taking over-the-counter medications to assist with your symptoms  Symptoms can last for 3-10 days with lingering cough and intermittent symptoms lasting weeks after that.  The goal of treatment at this time is to reduce your symptoms and discomfort   I recommend using Robitussin and Mucinex (regular formulations, nothing with decongestants or DM)  You can also use Tylenol  for body aches and fever reduction I also recommend adding an antihistamine to your daily regimen This includes medications like Claritin, Allegra, Zyrtec- the generics of these work very well and are usually less expensive I recommend using Flonase nasal spray - 2 puffs twice per day to help with your nasal congestion The antihistamines and Flonase can take a few weeks to provide significant relief from allergy symptoms but should start to provide some benefit soon. You can use a humidifier at night to help with preventing nasal dryness and irritation   If your symptoms are not improving or seem to be getting worse over the next 5 to 7 days you can always return here to urgent care or you can follow-up with your primary care provider for ongoing management Go to the ER if you begin to have more serious symptoms such as shortness of breath, trouble breathing, loss of consciousness, swelling around the eyes, high fever, severe lasting headaches, vision changes or neck pain/stiffness.       ED Prescriptions   None    PDMP not reviewed this encounter.   Diavian Furgason, Pearla Bottom, PA-C 10/05/23 1225

## 2023-10-06 ENCOUNTER — Encounter: Payer: Self-pay | Admitting: Physician Assistant

## 2023-10-06 ENCOUNTER — Ambulatory Visit: Admitting: Physician Assistant

## 2023-10-06 VITALS — BP 138/76 | HR 60 | Temp 97.5°F | Ht 63.5 in | Wt 186.5 lb

## 2023-10-06 DIAGNOSIS — R051 Acute cough: Secondary | ICD-10-CM | POA: Diagnosis not present

## 2023-10-06 DIAGNOSIS — R5383 Other fatigue: Secondary | ICD-10-CM | POA: Diagnosis not present

## 2023-10-06 DIAGNOSIS — R7989 Other specified abnormal findings of blood chemistry: Secondary | ICD-10-CM | POA: Diagnosis not present

## 2023-10-06 DIAGNOSIS — Z903 Acquired absence of stomach [part of]: Secondary | ICD-10-CM | POA: Diagnosis not present

## 2023-10-06 LAB — CBC WITH DIFFERENTIAL/PLATELET
Basophils Absolute: 0.1 10*3/uL (ref 0.0–0.2)
Basos: 1 %
EOS (ABSOLUTE): 0 10*3/uL (ref 0.0–0.4)
Eos: 1 %
Hematocrit: 33.1 % — ABNORMAL LOW (ref 34.0–46.6)
Hemoglobin: 10.7 g/dL — ABNORMAL LOW (ref 11.1–15.9)
Immature Grans (Abs): 0 10*3/uL (ref 0.0–0.1)
Immature Granulocytes: 0 %
Lymphocytes Absolute: 1.6 10*3/uL (ref 0.7–3.1)
Lymphs: 39 %
MCH: 27.2 pg (ref 26.6–33.0)
MCHC: 32.3 g/dL (ref 31.5–35.7)
MCV: 84 fL (ref 79–97)
Monocytes Absolute: 0.6 10*3/uL (ref 0.1–0.9)
Monocytes: 16 %
Neutrophils Absolute: 1.8 10*3/uL (ref 1.4–7.0)
Neutrophils: 43 %
Platelets: 239 10*3/uL (ref 150–450)
RBC: 3.94 x10E6/uL (ref 3.77–5.28)
RDW: 13.6 % (ref 11.7–15.4)
WBC: 4 10*3/uL (ref 3.4–10.8)

## 2023-10-06 LAB — COMPREHENSIVE METABOLIC PANEL WITH GFR
ALT: 86 IU/L — ABNORMAL HIGH (ref 0–32)
AST: 61 IU/L — ABNORMAL HIGH (ref 0–40)
Albumin: 3.9 g/dL (ref 3.8–4.9)
Alkaline Phosphatase: 133 IU/L — ABNORMAL HIGH (ref 44–121)
BUN/Creatinine Ratio: 11 (ref 9–23)
BUN: 8 mg/dL (ref 6–24)
Bilirubin Total: 0.5 mg/dL (ref 0.0–1.2)
CO2: 21 mmol/L (ref 20–29)
Calcium: 9.1 mg/dL (ref 8.7–10.2)
Chloride: 107 mmol/L — ABNORMAL HIGH (ref 96–106)
Creatinine, Ser: 0.72 mg/dL (ref 0.57–1.00)
Globulin, Total: 2.9 g/dL (ref 1.5–4.5)
Glucose: 105 mg/dL — ABNORMAL HIGH (ref 70–99)
Potassium: 3.5 mmol/L (ref 3.5–5.2)
Sodium: 138 mmol/L (ref 134–144)
Total Protein: 6.8 g/dL (ref 6.0–8.5)
eGFR: 97 mL/min/{1.73_m2} (ref >59)

## 2023-10-06 LAB — IBC + FERRITIN
Ferritin: 46.5 ng/mL (ref 10.0–291.0)
Iron: 24 ug/dL — ABNORMAL LOW (ref 42–145)
Saturation Ratios: 7.2 % — ABNORMAL LOW (ref 20.0–50.0)
TIBC: 334.6 ug/dL (ref 250.0–450.0)
Transferrin: 239 mg/dL (ref 212.0–360.0)

## 2023-10-06 LAB — VITAMIN D 25 HYDROXY (VIT D DEFICIENCY, FRACTURES): VITD: 20.3 ng/mL — ABNORMAL LOW (ref 30.00–100.00)

## 2023-10-06 LAB — HEMOGLOBIN A1C: Hgb A1c MFr Bld: 5.9 % (ref 4.6–6.5)

## 2023-10-06 LAB — VITAMIN B12: Vitamin B-12: 890 pg/mL (ref 211–911)

## 2023-10-06 MED ORDER — ALBUTEROL SULFATE HFA 108 (90 BASE) MCG/ACT IN AERS
2.0000 | INHALATION_SPRAY | Freq: Four times a day (QID) | RESPIRATORY_TRACT | 0 refills | Status: DC | PRN
Start: 2023-10-06 — End: 2023-11-02

## 2023-10-06 MED ORDER — ACCRUFER 30 MG PO CAPS: 30.0000 mg | ORAL_CAPSULE | Freq: Two times a day (BID) | ORAL | 1 refills | Status: DC

## 2023-10-06 NOTE — Patient Instructions (Signed)
 It was great to see you!  Take care,  Jarold Motto PA-C

## 2023-10-06 NOTE — Progress Notes (Signed)
 Natasha Doyle is a 58 y.o. female here for a new problem.  History of Present Illness:   Chief Complaint  Patient presents with   Fatigue    Pt c/o feeling tired x 1 month. Went to UC yesterday and wants to discuss lab results.    HPI  Fatigue  Patient complains of feeling fatigued for the past month or so.  Reports having an abscess and being put on Augmentin  then clindamycin  per her dentist for her tooth infection.    She was seen in the UC yesterday for a slight fever, cough, and body aches. Blood work done then showed elevated LFTs and low iron levels.  She has been taking plenty of tylenol  and hydrocodone  for her tooth ache  Reports taking robitussin to relief her symptoms. She also reports taking her bariatric vitamins but denies taking any iron supplements.  Regarding her fatigue, patient states that she works 12 hours shifts. She often has to commute about 3 hours daily to/ from work.  She denies any diarrhea  She has known iron deficiency anemia from gastric surgery and is UpToDate on colon cancer screening  Past Medical History:  Diagnosis Date   Diabetes mellitus without complication (HCC)    borderline   GERD (gastroesophageal reflux disease)    Hypertension    Obesity    Vaginal delivery 1989, 1992     Social History   Tobacco Use   Smoking status: Never   Smokeless tobacco: Never  Vaping Use   Vaping status: Never Used  Substance Use Topics   Alcohol use: Not Currently   Drug use: No    Past Surgical History:  Procedure Laterality Date   BARIATRIC SURGERY     BREATH TEK H PYLORI N/A 10/22/2014   Procedure: BREATH TEK H PYLORI;  Surgeon: Aldean Hummingbird, MD;  Location: [facility];  Service: General;  Laterality: N/A;   CHOLECYSTECTOMY     HERNIA REPAIR     HIATAL HERNIA REPAIR  01/04/2020   LAPAROSCOPIC GASTRIC SLEEVE RESECTION N/A 04/29/2015   Procedure: LAPAROSCOPIC GASTRIC SLEEVE RESECTION;  Surgeon: Baldomero Levans, MD;  Location: [facility];  Service:  General;  Laterality: N/A;    Family History  Problem Relation Age of Onset   Cancer Mother        breast   Hypertension Mother    Hyperlipidemia Mother    Alcohol abuse Father    Breast cancer Sister    Heart disease Brother    Hyperlipidemia Brother    Hypertension Brother     No Known Allergies  Current Medications:   Current Outpatient Medications:    acetaminophen -codeine  (TYLENOL  #3) 300-30 MG tablet, Take 1-2 tablets by mouth every 6 (six) hours as needed for moderate pain (pain score 4-6)., Disp: 10 tablet, Rfl: 0   albuterol  (VENTOLIN  HFA) 108 (90 Base) MCG/ACT inhaler, Inhale 2 puffs into the lungs every 6 (six) hours as needed for wheezing or shortness of breath., Disp: 8 g, Rfl: 0   B Complex Vitamins (B COMPLEX 1 PO), Take 1 tablet by mouth daily in the afternoon., Disp: , Rfl:    clindamycin  (CLEOCIN ) 150 MG capsule, Take 150 mg by mouth 4 (four) times daily., Disp: , Rfl:    dicyclomine  (BENTYL ) 20 MG tablet, Take 1 tablet (20 mg total) by mouth 2 (two) times daily as needed for spasms., Disp: 30 tablet, Rfl: 0   Ferric Maltol  (ACCRUFER ) 30 MG CAPS, Take 1 capsule (30 mg total) by mouth  in the morning and at bedtime., Disp: 180 capsule, Rfl: 1   HYDROcodone -acetaminophen  (NORCO) 10-325 MG tablet, Take 1 tablet by mouth every 4 (four) hours as needed., Disp: , Rfl:    Multiple Vitamins-Calcium (ONE-A-DAY WOMENS PO), Take 1 tablet by mouth daily., Disp: , Rfl:    ondansetron  (ZOFRAN -ODT) 4 MG disintegrating tablet, Take 1 tablet (4 mg total) by mouth every 8 (eight) hours as needed for nausea or vomiting., Disp: 20 tablet, Rfl: 0   pantoprazole  (PROTONIX ) 40 MG tablet, Take 1 tablet (40 mg total) by mouth daily., Disp: 90 tablet, Rfl: 1   Vitamin E 45 MG (100 UNIT) CAPS, Take 1 capsule by mouth daily in the afternoon., Disp: , Rfl:    Review of Systems:   Review of Systems  Constitutional:  Positive for fever and malaise/fatigue.  Respiratory:  Positive for cough and  wheezing.   Gastrointestinal:  Negative for diarrhea.    Vitals:   Vitals:   10/06/23 1001  BP: 138/76  Pulse: 60  Temp: (!) 97.5 F (36.4 C)  TempSrc: Temporal  SpO2: 98%  Weight: 186 lb 8 oz (84.6 kg)  Height: 5' 3.5" (1.613 m)     Body mass index is 32.52 kg/m.  Physical Exam:   Physical Exam Vitals and nursing note reviewed.  Constitutional:      General: She is not in acute distress.    Appearance: She is well-developed. She is not ill-appearing or toxic-appearing.  Cardiovascular:     Rate and Rhythm: Normal rate and regular rhythm.     Pulses: Normal pulses.     Heart sounds: Normal heart sounds, S1 normal and S2 normal.  Pulmonary:     Effort: Pulmonary effort is normal.     Breath sounds: Normal breath sounds.  Skin:    General: Skin is warm and dry.  Neurological:     Mental Status: She is alert.     GCS: GCS eye subscore is 4. GCS verbal subscore is 5. GCS motor subscore is 6.  Psychiatric:        Speech: Speech normal.        Behavior: Behavior normal. Behavior is cooperative.     Assessment and Plan:   Fatigue, unspecified type No red flags; UpToDate on all cancer screenings Update blood work today Suspect related to lifestyle and commute Recommend oral iron supplement and better self care as able  History of sleeve gastrectomy Continue supplementation efforts and self care Continue to avoid NSAIDs   Acute cough No red flags on exam.   Will initiate albuterol  per orders.  Discussed taking medications as prescribed.  Reviewed return precautions including new or worsening fever, SOB, new or worsening cough or other concerns.  Push fluids and rest.  I recommend that patient follow-up if symptoms worsen or persist despite treatment x 7-10 days, sooner if needed.  Elevated LFTs No red flags Suspect related to recent excessive tylenol  use If any abdominal pain, I asked her to let us  know  Repeat LFT in 2 weeks (future order  placed)  Alexander Iba, PA-C  I,Safa M Kadhim,acting as a scribe for Alexander Iba, PA.,have documented all relevant documentation on the behalf of Alexander Iba, PA,as directed by  Alexander Iba, PA while in the presence of Alexander Iba, [name].   I, Alexander Iba, [name], have reviewed all documentation for this visit. The documentation on 10/06/23 for the exam, diagnosis, procedures, and orders are all accurate and complete.

## 2023-10-07 ENCOUNTER — Encounter: Payer: Self-pay | Admitting: Physician Assistant

## 2023-10-07 ENCOUNTER — Telehealth (INDEPENDENT_AMBULATORY_CARE_PROVIDER_SITE_OTHER): Admitting: Family Medicine

## 2023-10-07 ENCOUNTER — Other Ambulatory Visit: Payer: Self-pay | Admitting: Physician Assistant

## 2023-10-07 ENCOUNTER — Encounter: Payer: Self-pay | Admitting: Family Medicine

## 2023-10-07 VITALS — Temp 100.6°F

## 2023-10-07 DIAGNOSIS — J40 Bronchitis, not specified as acute or chronic: Secondary | ICD-10-CM

## 2023-10-07 MED ORDER — PREDNISONE 20 MG PO TABS: 40.0000 mg | ORAL_TABLET | Freq: Every day | ORAL | 0 refills | Status: AC

## 2023-10-07 MED ORDER — AZITHROMYCIN 250 MG PO TABS: ORAL_TABLET | ORAL | 0 refills | Status: AC

## 2023-10-07 MED ORDER — BENZONATATE 100 MG PO CAPS: 100.0000 mg | ORAL_CAPSULE | Freq: Three times a day (TID) | ORAL | 0 refills | Status: DC | PRN

## 2023-10-07 MED ORDER — VITAMIN D (ERGOCALCIFEROL) 1.25 MG (50000 UNIT) PO CAPS: 50000.0000 [IU] | ORAL_CAPSULE | ORAL | 0 refills | Status: DC

## 2023-10-07 NOTE — Patient Instructions (Signed)
 Take the zpack  Continue inhaler  Only take prednisone  if progressively worsening.

## 2023-10-07 NOTE — Progress Notes (Signed)
 MyChart Video Visit Virtual Visit via Video Note   This visit type was conducted w/patient consent. This format is felt to be most appropriate for this patient at this time. Physical exam was limited by quality of the video and audio technology used for the visit. CMA was able to get the patient set up on a video visit.  Patient location: parked car. Patient and provider in visit Provider location: Office  I discussed the limitations of evaluation and management by telemedicine and the availability of in person appointments. The patient expressed understanding and agreed to proceed.  Visit Date: 10/07/2023  Today's healthcare provider: Ellsworth Haas, MD     Subjective:    Patient ID: Natasha Doyle, female    DOB: Nov 25, 1965, 58 y.o.   MRN: [id number]  Chief Complaint  Patient presents with   Cough   Fever   Chest Congestion    Rattling in chest, was seen in office on yesterday, went to UC the day before, tested negative for Covid and Flu    HPI Discussed the use of AI scribe software for clinical note transcription with the patient, who gave verbal consent to proceed.  History of Present Illness Natasha Doyle is a 58 year old female who presents with a cough and chest rattle.  She has been experiencing a cough for the past three days, with the worst symptoms occurring last night. She describes a rattling sensation in her chest and wheezing. She uses an inhaler every six hours as needed. She also reports a fever of 100.6 to 100.7 degrees Fahrenheit around 3 PM yesterday. No shortness of breath or chest pain, but she notes body soreness, runny nose, and congestion. She went to UC 4/23-flu/covid negative.  Seen here yesterday - notes reviewed and spoke to her PCP myself.   She has been experiencing a toothache and has been on two different antibiotics and hydrocodone . She completed a course of clindamycin  yesterday, which she was taking when she became ill. She mentions that her  recent blood work indicated potential liver issues, which she attributes to Tylenol  use.     Past Medical History:  Diagnosis Date   Diabetes mellitus without complication (HCC)    borderline   GERD (gastroesophageal reflux disease)    Hypertension    Obesity    Vaginal delivery 1989, 1992    Past Surgical History:  Procedure Laterality Date   BARIATRIC SURGERY     BREATH TEK H PYLORI N/A 10/22/2014   Procedure: BREATH TEK H PYLORI;  Surgeon: Aldean Hummingbird, MD;  Location: [facility];  Service: General;  Laterality: N/A;   CHOLECYSTECTOMY     HERNIA REPAIR     HIATAL HERNIA REPAIR  01/04/2020   LAPAROSCOPIC GASTRIC SLEEVE RESECTION N/A 04/29/2015   Procedure: LAPAROSCOPIC GASTRIC SLEEVE RESECTION;  Surgeon: Baldomero Levans, MD;  Location: [facility];  Service: General;  Laterality: N/A;    Outpatient Medications Prior to Visit  Medication Sig Dispense Refill   acetaminophen -codeine  (TYLENOL  #3) 300-30 MG tablet Take 1-2 tablets by mouth every 6 (six) hours as needed for moderate pain (pain score 4-6). 10 tablet 0   albuterol  (VENTOLIN  HFA) 108 (90 Base) MCG/ACT inhaler Inhale 2 puffs into the lungs every 6 (six) hours as needed for wheezing or shortness of breath. 8 g 0   B Complex Vitamins (B COMPLEX 1 PO) Take 1 tablet by mouth daily in the afternoon.     dicyclomine  (BENTYL ) 20 MG tablet Take 1 tablet (  20 mg total) by mouth 2 (two) times daily as needed for spasms. 30 tablet 0   Ferric Maltol  (ACCRUFER ) 30 MG CAPS Take 1 capsule (30 mg total) by mouth in the morning and at bedtime. 180 capsule 1   HYDROcodone -acetaminophen  (NORCO) 10-325 MG tablet Take 1 tablet by mouth every 4 (four) hours as needed.     Multiple Vitamins-Calcium (ONE-A-DAY WOMENS PO) Take 1 tablet by mouth daily.     ondansetron  (ZOFRAN -ODT) 4 MG disintegrating tablet Take 1 tablet (4 mg total) by mouth every 8 (eight) hours as needed for nausea or vomiting. 20 tablet 0   pantoprazole  (PROTONIX ) 40 MG tablet Take 1  tablet (40 mg total) by mouth daily. 90 tablet 1   Vitamin E 45 MG (100 UNIT) CAPS Take 1 capsule by mouth daily in the afternoon.     clindamycin  (CLEOCIN ) 150 MG capsule Take 150 mg by mouth 4 (four) times daily. (Patient not taking: Reported on 10/07/2023)     No facility-administered medications prior to visit.    No Known Allergies      Objective:     Physical Exam  Vitals and nursing note reviewed.  Constitutional:      General:  is not in acute distress. Nontoxic appearing    Appearance: Normal appearance.  HENT:     Head: Normocephalic.  Pulmonary:     Effort: No respiratory distress.  Skin:    General: Skin is dry.     Coloration: Skin is not pale.  Neurological:     Mental Status: Pt is alert and oriented to person, place, and time.  Psychiatric:        Mood and Affect: Mood normal.   Temp (!) 100.6 F (38.1 C)   LMP 09/03/2012   Wt Readings from Last 3 Encounters:  10/06/23 186 lb 8 oz (84.6 kg)  10/05/23 179 lb (81.2 kg)  04/07/23 191 lb 8 oz (86.9 kg)       Assessment & Plan:   Problem List Items Addressed This Visit   None Visit Diagnoses       Bronchitis    -  Primary     Assessment and Plan Assessment & Plan Acute upper respiratory infection   She presents with an acute upper respiratory infection characterized by cough, wheezing, and chest rattling for three days, accompanied by a fever of 100.43F to 100.81F. There is no shortness of breath or chest pain, but she has a runny nose and congestion. While a viral infection is possible, a bacterial infection is considered due to recent antibiotic use and persistent symptoms. Informed consent for Z-Pak and Tessalon Perles was discussed, noting variable response to [employer]. Prednisone  is considered as a backup if symptoms worsen, though it is preferred to avoid due to concerns about an infected tooth. Start Z-Pak (azithromycin ) for potential bacterial infection. Use an inhaler every 4-6 hours as  needed for cough and wheezing. Recommend a home COVID test to rule out COVID-19 infection(first test negative 2 days ago). Prescribe Tessalon Perles for cough management. Consider prednisone  if symptoms worsen, particularly inflammation and severe coughing.  Toothache   Her toothache was previously managed with clindamycin  and hydrocodone . There is concern for potential infection, but no current antibiotics are prescribed specifically for the toothache as just completed augmentin  then clinda. Monitoring is advised due to the potential impact of steroids on the infected tooth.  Adverse effect of acetaminophen    There is a possible adverse effect of acetaminophen  on liver  function due to prolonged use for toothache pain management but may be from URI as well. Recent blood work indicated potential liver involvement, possibly exacerbated by the current illness. She is advised to reduce or avoid acetaminophen  use to prevent further liver impact.    Meds ordered this encounter  Medications   azithromycin  (ZITHROMAX ) 250 MG tablet    Sig: Take 2 tablets on day 1, then 1 tablet daily on days 2 through 5    Dispense:  6 tablet    Refill:  0   benzonatate (TESSALON PERLES) 100 MG capsule    Sig: Take 1 capsule (100 mg total) by mouth 3 (three) times daily as needed.    Dispense:  20 capsule    Refill:  0   predniSONE  (DELTASONE ) 20 MG tablet    Sig: Take 2 tablets (40 mg total) by mouth daily with breakfast for 5 days.    Dispense:  10 tablet    Refill:  0    I discussed the assessment and treatment plan with the patient. The patient was provided an opportunity to ask questions and all were answered. The patient agreed with the plan and demonstrated an understanding of the instructions.   The patient was advised to call back or seek an in-person evaluation if the symptoms worsen or if the condition fails to improve as anticipated.  Return if symptoms worsen or fail to improve.  Ellsworth Haas,  MD [facility] [phone] (phone) [phone] (fax)  [facility]

## 2023-10-17 ENCOUNTER — Encounter: Payer: 59 | Admitting: Physician Assistant

## 2023-10-20 ENCOUNTER — Other Ambulatory Visit

## 2023-11-02 ENCOUNTER — Other Ambulatory Visit: Payer: Self-pay | Admitting: Physician Assistant

## 2023-11-15 ENCOUNTER — Other Ambulatory Visit: Payer: Self-pay | Admitting: Physician Assistant

## 2023-11-15 ENCOUNTER — Encounter: Payer: Self-pay | Admitting: Physician Assistant

## 2023-11-15 ENCOUNTER — Ambulatory Visit (INDEPENDENT_AMBULATORY_CARE_PROVIDER_SITE_OTHER): Admitting: Physician Assistant

## 2023-11-15 VITALS — BP 124/80 | HR 57 | Temp 97.5°F | Ht 64.0 in | Wt 187.0 lb

## 2023-11-15 DIAGNOSIS — K219 Gastro-esophageal reflux disease without esophagitis: Secondary | ICD-10-CM

## 2023-11-15 DIAGNOSIS — Z903 Acquired absence of stomach [part of]: Secondary | ICD-10-CM | POA: Diagnosis not present

## 2023-11-15 DIAGNOSIS — E785 Hyperlipidemia, unspecified: Secondary | ICD-10-CM

## 2023-11-15 DIAGNOSIS — E669 Obesity, unspecified: Secondary | ICD-10-CM

## 2023-11-15 DIAGNOSIS — D508 Other iron deficiency anemias: Secondary | ICD-10-CM | POA: Diagnosis not present

## 2023-11-15 DIAGNOSIS — Z0000 Encounter for general adult medical examination without abnormal findings: Secondary | ICD-10-CM

## 2023-11-15 DIAGNOSIS — R748 Abnormal levels of other serum enzymes: Secondary | ICD-10-CM | POA: Diagnosis not present

## 2023-11-15 DIAGNOSIS — M793 Panniculitis, unspecified: Secondary | ICD-10-CM

## 2023-11-15 LAB — LIPID PANEL
Cholesterol: 131 mg/dL (ref 0–200)
HDL: 41.4 mg/dL (ref >39.00)
LDL Cholesterol: 78 mg/dL (ref 0–99)
NonHDL: 89.57
Total CHOL/HDL Ratio: 3
Triglycerides: 59 mg/dL (ref 0.0–149.0)
VLDL: 11.8 mg/dL (ref 0.0–40.0)

## 2023-11-15 LAB — CBC WITH DIFFERENTIAL/PLATELET
Basophils Absolute: 0.1 10*3/uL (ref 0.0–0.1)
Basophils Relative: 1.4 % (ref 0.0–3.0)
Eosinophils Absolute: 0.1 10*3/uL (ref 0.0–0.7)
Eosinophils Relative: 2.8 % (ref 0.0–5.0)
HCT: 33.4 % — ABNORMAL LOW (ref 36.0–46.0)
Hemoglobin: 11 g/dL — ABNORMAL LOW (ref 12.0–15.0)
Lymphocytes Relative: 56.5 % — ABNORMAL HIGH (ref 12.0–46.0)
Lymphs Abs: 2.2 10*3/uL (ref 0.7–4.0)
MCHC: 32.9 g/dL (ref 30.0–36.0)
MCV: 81.3 fl (ref 78.0–100.0)
Monocytes Absolute: 0.3 10*3/uL (ref 0.1–1.0)
Monocytes Relative: 8.4 % (ref 3.0–12.0)
Neutro Abs: 1.2 10*3/uL — ABNORMAL LOW (ref 1.4–7.7)
Neutrophils Relative %: 30.9 % — ABNORMAL LOW (ref 43.0–77.0)
Platelets: 232 10*3/uL (ref 150.0–400.0)
RBC: 4.11 Mil/uL (ref 3.87–5.11)
RDW: 15.2 % (ref 11.5–15.5)
WBC: 3.8 10*3/uL — ABNORMAL LOW (ref 4.0–10.5)

## 2023-11-15 LAB — COMPREHENSIVE METABOLIC PANEL WITH GFR
ALT: 44 U/L — ABNORMAL HIGH (ref 0–35)
AST: 46 U/L — ABNORMAL HIGH (ref 0–37)
Albumin: 3.7 g/dL (ref 3.5–5.2)
Alkaline Phosphatase: 98 U/L (ref 39–117)
BUN: 9 mg/dL (ref 6–23)
CO2: 25 meq/L (ref 19–32)
Calcium: 8.9 mg/dL (ref 8.4–10.5)
Chloride: 109 meq/L (ref 96–112)
Creatinine, Ser: 0.73 mg/dL (ref 0.40–1.20)
GFR: 90.44 mL/min (ref >60.00)
Glucose, Bld: 90 mg/dL (ref 70–99)
Potassium: 3.8 meq/L (ref 3.5–5.1)
Sodium: 139 meq/L (ref 135–145)
Total Bilirubin: 0.8 mg/dL (ref 0.2–1.2)
Total Protein: 6.9 g/dL (ref 6.0–8.3)

## 2023-11-15 MED ORDER — WEGOVY 0.25 MG/0.5ML ~~LOC~~ SOAJ: 0.2500 mg | SUBCUTANEOUS | 1 refills | Status: DC

## 2023-11-15 NOTE — Telephone Encounter (Signed)
PA needed for Wegovy 0.25 mg.

## 2023-11-15 NOTE — Patient Instructions (Signed)
 It was great to see you!  Ask your sister about testing for breast cancer -- did her oncology team recommend that her siblings get tested? We will refer to hematology for further evaluation of your iron deficiency anemia to see if they can help us  navigate ways to increase your iron We will try to send in [facility] weekly injection for your weight loss Please call the [facility] group, or send MyChart message, to see if they can help provide a new quote for your surgery with your new insurance  Let's follow-up in 3 months if we are able to start the weight loss medications, sooner if you have concerns.  Take care,  Alexander Iba PA-C

## 2023-11-15 NOTE — Progress Notes (Signed)
 Subjective:    Natasha Doyle is a 58 y.o. female and is here for a comprehensive physical exam.  HPI Chief Complaint  Patient presents with   Annual Exam    Acute Concerns: Panniculitis Pt complains of a rash by her incision site from her duodenal switch surgery.  She will call to schedule an appt to f/u on this.   Chronic Issues: Iron deficiency anemia Pt complains of chronic fatigue; last seen on 10/06/23.  Has been taking iron supplements and endorses constipation.  She skipped her dose on Saturday due to constipation  GERD Pt takes Pantoprazole  40 mg daily. Tolerating well with no side effects.  No acute concerns reported today.   Health Maintenance: Immunizations -- UTD.  Colonoscopy -- UTD, last done 2016. Mammogram -- Due soon, last done 11/26/2022. No mammographic evidence of malignancy.  PAP -- UTD, last done 10/14/22. Results were NILM. Repeat 5 years, next due 10/13/2027.  Bone Density -- N/a Diet -- Does not eat large portions during meals. Since her duodenal switch she reports "binge eating" and sweet cravings.  Exercise -- Walking.   Sleep habits -- No concerns. Works nights.  Mood -- Stable.   UTD with dentist? - Yes UTD with eye doctor? - Yes  Weight history: Wt Readings from Last 10 Encounters:  11/15/23 187 lb (84.8 kg)  10/06/23 186 lb 8 oz (84.6 kg)  10/05/23 179 lb (81.2 kg)  04/07/23 191 lb 8 oz (86.9 kg)  11/18/22 194 lb (88 kg)  10/21/22 197 lb 9.6 oz (89.6 kg)  10/13/22 197 lb (89.4 kg)  05/26/22 203 lb 4 oz (92.2 kg)  12/23/21 210 lb (95.3 kg)  12/08/21 203 lb (92.1 kg)   Body mass index is 32.1 kg/m. Patient's last menstrual period was 09/03/2012.  Alcohol use:  reports that she does not currently use alcohol.  Tobacco use:  Tobacco Use: Low Risk  (11/15/2023)   Patient History    Smoking Tobacco Use: Never    Smokeless Tobacco Use: Never    Passive Exposure: Not on file   Eligible for lung cancer screening? no      11/15/2023    1:28 PM  Depression screen PHQ 2/9  Decreased Interest 0  Down, Depressed, Hopeless 0  PHQ - 2 Score 0     Other providers/specialists: Patient Care Team: Alexander Iba, [name] as PCP - General (Physician Assistant)    PMHx, SurgHx, SocialHx, Medications, and Allergies were reviewed in the Visit Navigator and updated as appropriate.   Past Medical History:  Diagnosis Date   Diabetes mellitus without complication (HCC)    borderline   GERD (gastroesophageal reflux disease)    Hypertension    Obesity    Vaginal delivery 1989, 1992     Past Surgical History:  Procedure Laterality Date   BARIATRIC SURGERY     BREATH TEK H PYLORI N/A 10/22/2014   Procedure: BREATH TEK H PYLORI;  Surgeon: Aldean Hummingbird, MD;  Location: [facility];  Service: General;  Laterality: N/A;   CHOLECYSTECTOMY     HERNIA REPAIR     HIATAL HERNIA REPAIR  01/04/2020   LAPAROSCOPIC GASTRIC SLEEVE RESECTION N/A 04/29/2015   Procedure: LAPAROSCOPIC GASTRIC SLEEVE RESECTION;  Surgeon: Baldomero Levans, MD;  Location: [facility];  Service: General;  Laterality: N/A;     Family History  Problem Relation Age of Onset   Cancer Mother        breast   Hypertension Mother    Hyperlipidemia  Mother    Alcohol abuse Father    Breast cancer Sister    Heart disease Brother    Hyperlipidemia Brother    Hypertension Brother    COPD Sister     Social History   Tobacco Use   Smoking status: Never   Smokeless tobacco: Never  Vaping Use   Vaping status: Never Used  Substance Use Topics   Alcohol use: Not Currently   Drug use: No    Review of Systems:   Review of Systems  Constitutional:  Negative for chills, fever, malaise/fatigue and weight loss.  HENT:  Negative for hearing loss, sinus pain and sore throat.   Respiratory:  Negative for cough, hemoptysis and shortness of breath.   Cardiovascular:  Negative for chest pain, palpitations, orthopnea, claudication, leg swelling and PND.   Gastrointestinal:  Negative for abdominal pain, constipation, diarrhea, heartburn, nausea and vomiting.  Genitourinary:  Negative for dysuria, frequency and urgency.  Musculoskeletal:  Negative for back pain, myalgias and neck pain.  Skin:  Negative for itching and rash.  Neurological:  Negative for dizziness, tingling, seizures and headaches.  Endo/Heme/Allergies:  Negative for polydipsia.  Psychiatric/Behavioral:  Negative for depression. The patient is not nervous/anxious.       Objective:   BP 124/80 (BP Location: Left Arm, Patient Position: Sitting, Cuff Size: Large)   Pulse (!) 57   Temp (!) 97.5 F (36.4 C) (Temporal)   Ht 5\' 4"  (1.626 m)   Wt 187 lb (84.8 kg)   LMP 09/03/2012   SpO2 99%   BMI 32.10 kg/m  Body mass index is 32.1 kg/m.   General Appearance:    Alert, cooperative, no distress, appears stated age  Head:    Normocephalic, without obvious abnormality, atraumatic  Eyes:    PERRL, conjunctiva/corneas clear, EOM's intact, fundi    benign, both eyes  Ears:    Normal TM's and external ear canals, both ears  Nose:   Nares normal, septum midline, mucosa normal, no drainage    or sinus tenderness  Throat:   Lips, mucosa, and tongue normal; teeth and gums normal  Neck:   Supple, symmetrical, trachea midline, no adenopathy;    thyroid :  no enlargement/tenderness/nodules; no carotid   bruit or JVD  Back:     Symmetric, no curvature, ROM normal, no CVA tenderness  Lungs:     Clear to auscultation bilaterally, respirations unlabored  Chest Wall:    No tenderness or deformity   Heart:    Regular rate and rhythm, S1 and S2 normal, no murmur, rub or gallop  Breast Exam:    Deferred  Abdomen:     Soft, non-tender, bowel sounds active all four quadrants,    no masses, no organomegaly  Genitalia:    Deferred  Extremities:   Extremities normal, atraumatic, no cyanosis or edema  Pulses:   2+ and symmetric all extremities  Skin:   Skin color, texture, turgor normal, no  rashes or lesions  Lymph nodes:   Cervical, supraclavicular, and axillary nodes normal  Neurologic:   CNII-XII intact, normal strength, sensation and reflexes    throughout    Assessment/Plan:   Routine physical examination Today patient counseled on age appropriate routine health concerns for screening and prevention, each reviewed and up to date or declined. Immunizations reviewed and up to date or declined. Labs ordered and reviewed. Risk factors for depression reviewed and negative. Hearing function and visual acuity are intact. ADLs screened and addressed as needed. Functional ability and  level of safety reviewed and appropriate. Education, counseling and referrals performed based on assessed risks today. Patient provided with a copy of personalized plan for preventive services.  Panniculitis Recommend close follow up with plastics to see if more affordable option with new insurance  Iron deficiency anemia secondary to inadequate dietary iron intake Will refer to hematology as she is unable to take iron without side effect(s)  History of sleeve gastrectomy Continue efforts at weight loss Recommend ongoing monitoring of blood work for deficiencies  Elevated liver enzymes Update liver labs today and provide recommendations   Gastroesophageal reflux disease, unspecified whether esophagitis present Continue protonix  40 mg daily  Hyperlipidemia, unspecified hyperlipidemia type Update lipid panel and provide recommendations   Obesity, unspecified class, unspecified obesity type, unspecified whether serious comorbidity present Continue efforts at weight loss Reviewed risks and benefits/side effect(s) of Wegovy -- she is agreeable Will send in 0.25 mg weekly dose Follow up in 3 month(s) if able to start, sooner if concerns   I, Bernita Bristle, acting as a Neurosurgeon for Alexander Iba, [name]., have documented all relevant documentation on the behalf of Alexander Iba, [name], as directed by    while in the presence of Alexander Iba, [name].  I, Alexander Iba, [name], have reviewed all documentation for this visit. The documentation on 11/15/23 for the exam, diagnosis, procedures, and orders are all accurate and complete.  Alexander Iba, PA-C [facility]

## 2023-11-16 ENCOUNTER — Other Ambulatory Visit (HOSPITAL_BASED_OUTPATIENT_CLINIC_OR_DEPARTMENT_OTHER): Payer: Self-pay

## 2023-11-16 ENCOUNTER — Telehealth: Payer: Self-pay

## 2023-11-16 ENCOUNTER — Ambulatory Visit: Payer: Self-pay | Admitting: Physician Assistant

## 2023-11-16 DIAGNOSIS — R748 Abnormal levels of other serum enzymes: Secondary | ICD-10-CM

## 2023-11-16 NOTE — Telephone Encounter (Signed)
 Spoke to pt told her Frederik Jansky is not covered by insurance it is a plan/benefit exclusion, weight loss drugs are not covered. Pt verbalized understanding.

## 2023-11-16 NOTE — Addendum Note (Signed)
 Addended by: Winona Haw on: 11/16/2023 02:29 PM   Modules accepted: Orders

## 2023-11-16 NOTE — Telephone Encounter (Signed)
 Pharmacy Patient Advocate Encounter   Received notification from RX Request Messages that prior authorization for [facility] 0.25mg /0.53ml is required/requested.   Insurance verification completed.   The patient is insured through [facility] .   Per test claim: Product/service not covered - plan/benefit exclusion

## 2023-11-23 ENCOUNTER — Ambulatory Visit (INDEPENDENT_AMBULATORY_CARE_PROVIDER_SITE_OTHER): Admitting: Surgical

## 2023-11-23 ENCOUNTER — Encounter: Payer: Self-pay | Admitting: Surgical

## 2023-11-23 VITALS — BP 154/80 | HR 66 | Ht 64.0 in | Wt 190.8 lb

## 2023-11-23 DIAGNOSIS — M793 Panniculitis, unspecified: Secondary | ICD-10-CM | POA: Diagnosis not present

## 2023-11-23 DIAGNOSIS — Z903 Acquired absence of stomach [part of]: Secondary | ICD-10-CM

## 2023-11-23 DIAGNOSIS — R21 Rash and other nonspecific skin eruption: Secondary | ICD-10-CM

## 2023-11-23 NOTE — Progress Notes (Signed)
   Referring Provider Alexander Iba, PA [address] [place],  [place] [address]   CC:  Chief Complaint  Patient presents with   Follow-up      Natasha Doyle is an 58 y.o. female.  HPI: Patient is a 58 year old female who is seen by Dr. Carolynne Citron 1 year ago for consultation of panniculitis and consideration of panniculectomy.  She was deemed a good candidate, however is here today for reevaluation and to submit to insurance.  She reports she has a new Health visitor.  She reports she is interested in pursuing panniculectomy.  She is having ongoing rashes within the fold of her abdominal pannus just above the pubic area.  She has lost 4 pounds since her appointment 1 year ago.  Review of Systems General: No fevers or chills, positive for rash  Physical Exam    11/23/2023   12:02 PM 11/15/2023    1:27 PM 10/06/2023   10:01 AM  Vitals with BMI  Height 5' 4 5' 4 5' 3.5  Weight 190 lbs 13 oz 187 lbs 186 lbs 8 oz  BMI 32.73 32.08 32.51  Systolic 154 124 578  Diastolic 80 80 76  Pulse 66 57 60    General:  No acute distress,  Alert and oriented, Non-Toxic, Normal speech and affect Abdomen: Rash noted within the anterior abdominal wall skin fold with hyperpigmentation.  Excess skin noted folding over pubic area.  Assessment/Plan Patient is a good candidate for abdominal panniculectomy.  She has previously seen by Dr. Carolynne Citron for consultation.  She would like to proceed at this point.  She is also good candidate for full abdominoplasty if she is interested.  Dr. Carolynne Citron was present for portion of today's encounter to evaluate patient as well.  The surgical plan was rediscussed, discussed drain placement, discussed recovery period.  Discussed with patient the cost for full abdominoplasty and provided her with a verbal quote.  She is going to think this over.  Will plan to move forward with panniculectomy and submit this to insurance.  Pictures were obtained of the patient  and placed in the chart with the patient's or guardian's permission.   Natasha Doyle 11/23/2023, 12:51 PM

## 2023-12-05 ENCOUNTER — Inpatient Hospital Stay: Attending: Hematology and Oncology

## 2023-12-05 ENCOUNTER — Encounter: Payer: Self-pay | Admitting: Hematology and Oncology

## 2023-12-05 ENCOUNTER — Inpatient Hospital Stay (HOSPITAL_BASED_OUTPATIENT_CLINIC_OR_DEPARTMENT_OTHER): Admitting: Hematology and Oncology

## 2023-12-05 VITALS — BP 163/86 | HR 70 | Temp 97.4°F | Resp 18 | Ht 64.0 in | Wt 192.0 lb

## 2023-12-05 DIAGNOSIS — Z803 Family history of malignant neoplasm of breast: Secondary | ICD-10-CM | POA: Diagnosis not present

## 2023-12-05 DIAGNOSIS — Z9884 Bariatric surgery status: Secondary | ICD-10-CM

## 2023-12-05 DIAGNOSIS — Z903 Acquired absence of stomach [part of]: Secondary | ICD-10-CM | POA: Diagnosis not present

## 2023-12-05 DIAGNOSIS — D539 Nutritional anemia, unspecified: Secondary | ICD-10-CM | POA: Diagnosis not present

## 2023-12-05 DIAGNOSIS — R748 Abnormal levels of other serum enzymes: Secondary | ICD-10-CM | POA: Diagnosis not present

## 2023-12-05 LAB — CBC WITH DIFFERENTIAL (CANCER CENTER ONLY)
Abs Immature Granulocytes: 0 10*3/uL (ref 0.00–0.07)
Basophils Absolute: 0.1 10*3/uL (ref 0.0–0.1)
Basophils Relative: 1 %
Eosinophils Absolute: 0.2 10*3/uL (ref 0.0–0.5)
Eosinophils Relative: 4 %
HCT: 34 % — ABNORMAL LOW (ref 36.0–46.0)
Hemoglobin: 10.9 g/dL — ABNORMAL LOW (ref 12.0–15.0)
Immature Granulocytes: 0 %
Lymphocytes Relative: 55 %
Lymphs Abs: 2.4 10*3/uL (ref 0.7–4.0)
MCH: 26.6 pg (ref 26.0–34.0)
MCHC: 32.1 g/dL (ref 30.0–36.0)
MCV: 82.9 fL (ref 80.0–100.0)
Monocytes Absolute: 0.5 10*3/uL (ref 0.1–1.0)
Monocytes Relative: 11 %
Neutro Abs: 1.3 10*3/uL — ABNORMAL LOW (ref 1.7–7.7)
Neutrophils Relative %: 29 %
Platelet Count: 242 10*3/uL (ref 150–400)
RBC: 4.1 MIL/uL (ref 3.87–5.11)
RDW: 15.2 % (ref 11.5–15.5)
WBC Count: 4.3 10*3/uL (ref 4.0–10.5)
nRBC: 0 % (ref 0.0–0.2)

## 2023-12-05 LAB — IRON AND IRON BINDING CAPACITY (CC-WL,HP ONLY)
Iron: 88 ug/dL (ref 28–170)
Saturation Ratios: 21 % (ref 10.4–31.8)
TIBC: 412 ug/dL (ref 250–450)
UIBC: 324 ug/dL (ref 148–442)

## 2023-12-05 LAB — FERRITIN: Ferritin: 61 ng/mL (ref 11–307)

## 2023-12-05 NOTE — Assessment & Plan Note (Signed)
 The cause of her anemia is multifactorial It could be due to multiple different mineral deficiencies given her significant bariatric surgery I will recheck her labs today and we will call her with test results She would need vigilant follow-up but I will determine timing of her next follow-up depending on test results

## 2023-12-05 NOTE — Progress Notes (Signed)
 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 that could be associated with significant mineral trace deficiencies I will check multiple different labs and we will call her with test results  Elevated liver enzymes She has intermittent elevated liver enzymes, could be related to her medication versus obesity Last CT imaging from December 2024 showed fatty liver Observe closely for now Orders Placed This Encounter  Procedures   Ferritin    Standing Status:   Future    Number of Occurrences:   1    Expiration Date:   12/04/2024   Iron and Iron Binding Capacity ([facility] only)    Standing Status:   Future    Number of Occurrences:   1    Expiration Date:   12/04/2024   CBC with Differential (Cancer Center Only)    Standing Status:   Future    Number of Occurrences:   1    Expiration Date:   12/04/2024   Copper, serum    Standing Status:   Future    Number of Occurrences:   1    Expiration Date:   12/04/2024   Zinc    Standing Status:   Future    Number of Occurrences:   1    Expiration Date:   12/04/2024   Vitamin B1    Standing Status:   Future    Number of Occurrences:   1    Expiration Date:   12/04/2024    All questions were answered. The patient knows to call the clinic with any problems, questions or concerns.  The total time spent in the appointment was 60 minutes encounter with patients including review of chart and various tests  results, discussions about plan of care and coordination of care plan  Natasha Bedford, MD 6/23/20251:47 PM   CHIEF COMPLAINTS/PURPOSE OF CONSULTATION:  Anemia  HISTORY OF PRESENTING ILLNESS:  Natasha Doyle 58 y.o. female is here because of anemia  She was found to have abnormal CBC from recent blood work The patient has significant history of bariatric surgery Her original surgery was in 2006 and she has since had revision with duodenal switch surgery in 2021 Overall, she has lost over 100 pounds over the past few years She complained of recent fatigue She denies recent chest pain on exertion, shortness of breath on minimal exertion, pre-syncopal episodes, or palpitations. She had not noticed any recent bleeding such as epistaxis, hematuria or hematochezia The patient denies over the counter NSAID ingestion. She is not on antiplatelets agents.  She had no prior history or diagnosis of cancer. Her age appropriate screening programs are up-to-date. She denies any pica and eats a variety of diet. She never donated blood or received blood transfusion The patient was prescribed oral iron supplements and she takes daily  MEDICAL HISTORY:  Past Medical History:  Diagnosis Date   Deficiency anemia 12/05/2023   Diabetes mellitus without complication (HCC)    borderline   GERD (gastroesophageal reflux disease)  Hypertension    Obesity    Vaginal delivery 1989, 1992    SURGICAL HISTORY: Past Surgical History:  Procedure Laterality Date   BARIATRIC SURGERY     BREATH TEK H PYLORI N/A 10/22/2014   Procedure: BREATH TEK H PYLORI;  Surgeon: Camellia Blush, MD;  Location: [facility];  Service: General;  Laterality: N/A;   CHOLECYSTECTOMY     HERNIA REPAIR     HIATAL HERNIA REPAIR  01/04/2020   LAPAROSCOPIC GASTRIC SLEEVE RESECTION N/A 04/29/2015   Procedure: LAPAROSCOPIC GASTRIC SLEEVE RESECTION;  Surgeon: Thom CHRISTELLA Pin, MD;  Location: [facility];  Service: General;  Laterality: N/A;     SOCIAL HISTORY: Social History   Socioeconomic History   Marital status: Single    Spouse name: Not on file   Number of children: Not on file   Years of education: Not on file   Highest education level: 12th grade  Occupational History   Not on file  Tobacco Use   Smoking status: Never   Smokeless tobacco: Never  Vaping Use   Vaping status: Never Used  Substance and Sexual Activity   Alcohol use: Not Currently   Drug use: No   Sexual activity: Not Currently    Birth control/protection: Post-menopausal  Other Topics Concern   Not on file  Social History Narrative   Nursing Assistant   Social Drivers of Health   Financial Resource Strain: Low Risk  (11/14/2023)   Overall Financial Resource Strain (CARDIA)    Difficulty of Paying Living Expenses: Not very hard  Food Insecurity: No Food Insecurity (11/14/2023)   Hunger Vital Sign    Worried About Running Out of Food in the Last Year: Never true    Ran Out of Food in the Last Year: Never true  Transportation Needs: No Transportation Needs (11/14/2023)   PRAPARE - Administrator, Civil Service (Medical): No    Lack of Transportation (Non-Medical): No  Physical Activity: Insufficiently Active (11/14/2023)   Exercise Vital Sign    Days of Exercise per Week: 3 days    Minutes of Exercise per Session: 30 min  Stress: No Stress Concern Present (11/14/2023)   [employer] of Occupational Health - Occupational Stress Questionnaire    Feeling of Stress : Not at all  Social Connections: Moderately Isolated (11/14/2023)   Social Connection and Isolation Panel    Frequency of Communication with Friends and Family: More than three times a week    Frequency of Social Gatherings with Friends and Family: More than three times a week    Attends Religious Services: More than 4 times per year    Active Member of [employer] or Organizations: No    Attends Engineer, structural: Not on file    Marital Status: Never married   Catering manager Violence: Not on file    FAMILY HISTORY: Family History  Problem Relation Age of Onset   Cancer Mother        breast   Hypertension Mother    Hyperlipidemia Mother    Alcohol abuse Father    Breast cancer Sister    COPD Sister    Breast cancer Sister    Diabetes Sister    Heart disease Brother    Hyperlipidemia Brother    Hypertension Brother     ALLERGIES:  has no known allergies.  MEDICATIONS:  Current Outpatient Medications  Medication Sig Dispense Refill   acetaminophen -codeine  (TYLENOL  #3) 300-30 MG tablet Take 1-2 tablets by mouth every 6 (  six) hours as needed for moderate pain (pain score 4-6). 10 tablet 0   albuterol  (VENTOLIN  HFA) 108 (90 Base) MCG/ACT inhaler TAKE 2 PUFFS BY MOUTH EVERY 6 HOURS AS NEEDED FOR WHEEZE OR SHORTNESS OF BREATH 8.5 each 2   B Complex Vitamins (B COMPLEX 1 PO) Take 1 tablet by mouth daily in the afternoon.     dicyclomine  (BENTYL ) 20 MG tablet Take 1 tablet (20 mg total) by mouth 2 (two) times daily as needed for spasms. 30 tablet 0   ferrous sulfate 324 MG TBEC Take 324 mg by mouth daily with breakfast.     Multiple Vitamins-Calcium (ONE-A-DAY WOMENS PO) Take 1 tablet by mouth daily.     ondansetron  (ZOFRAN -ODT) 4 MG disintegrating tablet Take 1 tablet (4 mg total) by mouth every 8 (eight) hours as needed for nausea or vomiting. 20 tablet 0   pantoprazole  (PROTONIX ) 40 MG tablet Take 1 tablet (40 mg total) by mouth daily. 90 tablet 1   Vitamin D , Ergocalciferol , (DRISDOL ) 1.25 MG (50000 UNIT) CAPS capsule Take 1 capsule (50,000 Units total) by mouth every 7 (seven) days. 12 capsule 0   Vitamin E 45 MG (100 UNIT) CAPS Take 1 capsule by mouth daily in the afternoon.     No current facility-administered medications for this visit.    REVIEW OF SYSTEMS:   Constitutional: Denies fevers, chills or abnormal night sweats Eyes: Denies blurriness of vision, double vision or watery eyes Ears, nose, mouth, throat, and face: Denies  mucositis or sore throat Respiratory: Denies cough, dyspnea or wheezes Cardiovascular: Denies palpitation, chest discomfort or lower extremity swelling Gastrointestinal:  Denies nausea, heartburn or change in bowel habits Skin: Denies abnormal skin rashes Lymphatics: Denies new lymphadenopathy or easy bruising Neurological:Denies numbness, tingling or new weaknesses Behavioral/Psych: Mood is stable, no new changes  All other systems were reviewed with the patient and are negative.  PHYSICAL EXAMINATION: ECOG PERFORMANCE STATUS: 1 - Symptomatic but completely ambulatory  Vitals:   12/05/23 1200  BP: (!) 163/86  Pulse: 70  Resp: 18  Temp: (!) 97.4 F (36.3 C)  SpO2: 100%   Filed Weights   12/05/23 1200  Weight: 192 lb (87.1 kg)    GENERAL:alert, no distress and comfortable SKIN: skin color, texture, turgor are normal, no rashes or significant lesions EYES: normal, conjunctiva are pink and non-injected, sclera clear OROPHARYNX:no exudate, no erythema and lips, buccal mucosa, and tongue normal  NECK: supple, thyroid  normal size, non-tender, without nodularity LYMPH:  no palpable lymphadenopathy in the cervical, axillary or inguinal LUNGS: clear to auscultation and percussion with normal breathing effort HEART: regular rate & rhythm and no murmurs and no lower extremity edema ABDOMEN:abdomen soft, non-tender and normal bowel sounds Musculoskeletal:no cyanosis of digits and no clubbing  PSYCH: alert & oriented x 3 with fluent speech NEURO: no focal motor/sensory deficits

## 2023-12-05 NOTE — Assessment & Plan Note (Addendum)
 She has intermittent elevated liver enzymes, could be related to her medication versus obesity Last CT imaging from December 2024 showed fatty liver Observe closely for now

## 2023-12-05 NOTE — Assessment & Plan Note (Signed)
 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

## 2023-12-07 ENCOUNTER — Encounter (HOSPITAL_COMMUNITY): Payer: Self-pay

## 2023-12-07 ENCOUNTER — Ambulatory Visit (HOSPITAL_COMMUNITY)
Admission: EM | Admit: 2023-12-07 | Discharge: 2023-12-07 | Disposition: A | Discharge status: Home / Self Care | Attending: Emergency Medicine | Admitting: Emergency Medicine

## 2023-12-07 DIAGNOSIS — M5412 Radiculopathy, cervical region: Secondary | ICD-10-CM | POA: Diagnosis not present

## 2023-12-07 LAB — VITAMIN B1: Vitamin B1 (Thiamine): 67.3 nmol/L (ref 66.5–200.0)

## 2023-12-07 MED ORDER — PREDNISONE 20 MG PO TABS: 40.0000 mg | ORAL_TABLET | Freq: Every day | ORAL | 0 refills | Status: AC

## 2023-12-07 MED ORDER — DEXAMETHASONE SODIUM PHOSPHATE 10 MG/ML IJ SOLN
INTRAMUSCULAR | Status: AC
  Filled 2023-12-07: qty 1

## 2023-12-07 MED ORDER — LIDOCAINE 5 % EX PTCH: 1.0000 | MEDICATED_PATCH | CUTANEOUS | 0 refills | Status: DC

## 2023-12-07 MED ORDER — DEXAMETHASONE SODIUM PHOSPHATE 10 MG/ML IJ SOLN
10.0000 mg | Freq: Once | INTRAMUSCULAR | Status: AC
Start: 2023-12-07 — End: 2023-12-07
  Administered 2023-12-07: 10 mg via INTRAMUSCULAR

## 2023-12-07 NOTE — ED Notes (Signed)
 Patient c/o numbness to the left middle, pointer, and thumb and states pain in the left shoulder Patient states she was hold a mailbox with her left hand while she was screwing a screw in it. Patient also c/o left shoulder pain.  Patient states she used arthritis cream on her left hand only.

## 2023-12-07 NOTE — ED Provider Notes (Signed)
 [facility]    CSN: [id number] Arrival date & time: 12/07/23  1308      History   Chief Complaint Chief Complaint  Patient presents with   finger numbness   Shoulder Pain    HPI Natasha Doyle is a 58 y.o. female.   Patient presents with left-sided neck and shoulder pain with radiating numbness down her left arm for a few days.  Patient denies any known injuries or recent falls.  Patient denies any history of this.  Patient denies taking any medications for her symptoms.  Patient does have a history of gastric sleeve surgery and therefore cannot take NSAIDs for pain.  The history is provided by the patient and medical records.  Shoulder Pain   Past Medical History:  Diagnosis Date   Deficiency anemia 12/05/2023   Diabetes mellitus without complication (HCC)    borderline   GERD (gastroesophageal reflux disease)    Hypertension    Obesity    Vaginal delivery 1989, 1992    Patient Active Problem List   Diagnosis Date Noted   Deficiency anemia 12/05/2023   Elevated liver enzymes 12/05/2023   Anal fissure 12/18/2021   Constipation 12/18/2021   Rectal bleeding 12/18/2021   BMI 40.0-44.9, adult (HCC) 06/17/2020   Hiatal hernia 11/05/2019   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   Gastroesophageal reflux disease 09/25/2019   Essential hypertension 09/28/2013    Past Surgical History:  Procedure Laterality Date   BARIATRIC SURGERY     BREATH TEK H PYLORI N/A 10/22/2014   Procedure: BREATH TEK H PYLORI;  Surgeon: Camellia Blush, MD;  Location: [facility];  Service: General;  Laterality: N/A;   CHOLECYSTECTOMY     HERNIA REPAIR     HIATAL HERNIA REPAIR  01/04/2020   LAPAROSCOPIC GASTRIC SLEEVE RESECTION N/A 04/29/2015   Procedure: LAPAROSCOPIC GASTRIC SLEEVE RESECTION;  Surgeon: Thom CHRISTELLA Pin, MD;  Location: [facility];  Service: General;  Laterality: N/A;    OB History     Gravida  2   Para  2   Term  2   Preterm  0   AB  0   Living  2       SAB  0   IAB  0   Ectopic  0   Multiple  0   Live Births  2            Home Medications    Prior to Admission medications   Medication Sig Start Date End Date Taking? Authorizing Provider  lidocaine  (LIDODERM ) 5 % Place 1 patch onto the skin daily. Remove & Discard patch within 12 hours or as directed by MD 12/07/23  Yes Johnie Flaming A, NP  predniSONE  (DELTASONE ) 20 MG tablet Take 2 tablets (40 mg total) by mouth daily for 5 days. 12/07/23 12/12/23 Yes Johnie Flaming A, NP  acetaminophen -codeine  (TYLENOL  #3) 300-30 MG tablet Take 1-2 tablets by mouth every 6 (six) hours as needed for moderate pain (pain score 4-6). 09/18/23   White, Elizabeth A, PA-C  albuterol  (VENTOLIN  HFA) 108 (90 Base) MCG/ACT inhaler TAKE 2 PUFFS BY MOUTH EVERY 6 HOURS AS NEEDED FOR WHEEZE OR SHORTNESS OF BREATH 11/02/23   Job Lukes, PA  B Complex Vitamins (B COMPLEX 1 PO) Take 1 tablet by mouth daily in the afternoon.    [provider]  dicyclomine  (BENTYL ) 20 MG tablet Take 1 tablet (20 mg total) by mouth 2 (two) times daily as needed for spasms. 04/07/23   Job,  Samantha, PA  ferrous sulfate 324 MG TBEC Take 324 mg by mouth daily with breakfast.    [provider]  Multiple Vitamins-Calcium (ONE-A-DAY WOMENS PO) Take 1 tablet by mouth daily.    [provider]  ondansetron  (ZOFRAN -ODT) 4 MG disintegrating tablet Take 1 tablet (4 mg total) by mouth every 8 (eight) hours as needed for nausea or vomiting. 05/24/23   Schutt, Marsa HERO, PA-C  pantoprazole  (PROTONIX ) 40 MG tablet Take 1 tablet (40 mg total) by mouth daily. 10/02/21   Job Lukes, PA  Vitamin D , Ergocalciferol , (DRISDOL ) 1.25 MG (50000 UNIT) CAPS capsule Take 1 capsule (50,000 Units total) by mouth every 7 (seven) days. 10/07/23   Job Lukes, PA  Vitamin E 45 MG (100 UNIT) CAPS Take 1 capsule by mouth daily in the afternoon.    [provider]    Family History Family History  Problem  Relation Age of Onset   Cancer Mother        breast   Hypertension Mother    Hyperlipidemia Mother    Alcohol abuse Father    Breast cancer Sister    COPD Sister    Breast cancer Sister    Diabetes Sister    Heart disease Brother    Hyperlipidemia Brother    Hypertension Brother     Social History Social History   Tobacco Use   Smoking status: Never   Smokeless tobacco: Never  Vaping Use   Vaping status: Never Used  Substance Use Topics   Alcohol use: Not Currently   Drug use: No     Allergies   Patient has no known allergies.   Review of Systems Review of Systems  Per HPI  Physical Exam Triage Vital Signs ED Triage Vitals  Encounter Vitals Group     BP 12/07/23 1333 135/75     Girls Systolic BP Percentile --      Girls Diastolic BP Percentile --      Boys Systolic BP Percentile --      Boys Diastolic BP Percentile --      Pulse Rate 12/07/23 1333 70     Resp 12/07/23 1333 16     Temp 12/07/23 1333 98.1 F (36.7 C)     Temp Source 12/07/23 1333 Tympanic     SpO2 12/07/23 1333 99 %     Weight --      Height --      Head Circumference --      Peak Flow --      Pain Score 12/07/23 1335 3     Pain Loc --      Pain Education --      Exclude from Growth Chart --    No data found.  Updated Vital Signs BP 135/75 (BP Location: Right Arm)   Pulse 70   Temp 98.1 F (36.7 C) (Tympanic)   Resp 16   LMP 09/03/2012   SpO2 99%   Visual Acuity Right Eye Distance:   Left Eye Distance:   Bilateral Distance:    Right Eye Near:   Left Eye Near:    Bilateral Near:     Physical Exam Vitals and nursing note reviewed.  Constitutional:      General: She is awake. She is not in acute distress.    Appearance: Normal appearance. She is well-developed and well-groomed. She is not ill-appearing.  Neck:     Comments: Left-sided cervical paraspinal muscular tenderness noted Musculoskeletal:     Cervical back: Full  passive range of motion without pain, normal  range of motion and neck supple. Muscular tenderness present.   Skin:    General: Skin is warm and dry.   Neurological:     Mental Status: She is alert.   Psychiatric:        Behavior: Behavior is cooperative.      UC Treatments / Results  Labs (all labs ordered are listed, but only abnormal results are displayed) Labs Reviewed - No data to display  EKG   Radiology No results found.  Procedures Procedures (including critical care time)  Medications Ordered in UC Medications  dexamethasone  (DECADRON ) injection 10 mg (has no administration in time range)    Initial Impression / Assessment and Plan / UC Course  I have reviewed the triage vital signs and the nursing notes.  Pertinent labs & imaging results that were available during my care of the patient were reviewed by me and considered in my medical decision making (see chart for details).     Patient is overall well-appearing.  Vitals are stable.  Upon assessment there is left-sided cervical paraspinal muscular tenderness noted.  Symptoms and exam consistent with cervical radiculopathy.  Given IM Decadron  in clinic.  Prescribed prednisone  burst.  Prescribed lidocaine  patches as needed for additional pain relief.  Recommended Tylenol  as needed for pain.  Given orthopedic follow-up.  Discussed return precautions Final Clinical Impressions(s) / UC Diagnoses   Final diagnoses:  Cervical radiculopathy     Discharge Instructions      You are given an injection of Decadron  in clinic today to help with inflammation causing your pain. You can start taking 2 tablets of prednisone  once daily for 5 days for additional relief You can also apply lidocaine  patches for 12 hours at a time once daily for additional relief Otherwise you can take 650 mg of Tylenol  every 6-8 hours as needed for pain. Alternate between ice and heat as needed for pain. Do some gentle stretching and continue to move to avoid becoming stiff. You can  follow-up with [facility] if your pain and numbness continue. Return here as needed.   ED Prescriptions     Medication Sig Dispense Auth. Provider   predniSONE  (DELTASONE ) 20 MG tablet Take 2 tablets (40 mg total) by mouth daily for 5 days. 10 tablet Johnie Flaming A, NP   lidocaine  (LIDODERM ) 5 % Place 1 patch onto the skin daily. Remove & Discard patch within 12 hours or as directed by MD 30 patch Johnie Flaming LABOR, NP      PDMP not reviewed this encounter.   Johnie Flaming A, NP 12/07/23 1425

## 2023-12-07 NOTE — Discharge Instructions (Addendum)
 You are given an injection of Decadron  in clinic today to help with inflammation causing your pain. You can start taking 2 tablets of prednisone  once daily for 5 days for additional relief You can also apply lidocaine  patches for 12 hours at a time once daily for additional relief Otherwise you can take 650 mg of Tylenol  every 6-8 hours as needed for pain. Alternate between ice and heat as needed for pain. Do some gentle stretching and continue to move to avoid becoming stiff. You can follow-up with [facility] if your pain and numbness continue. Return here as needed.

## 2023-12-08 ENCOUNTER — Other Ambulatory Visit: Payer: Self-pay | Admitting: Hematology and Oncology

## 2023-12-08 ENCOUNTER — Ambulatory Visit: Payer: Self-pay | Admitting: Hematology and Oncology

## 2023-12-08 DIAGNOSIS — D539 Nutritional anemia, unspecified: Secondary | ICD-10-CM

## 2023-12-08 LAB — COPPER, SERUM: Copper: 110 ug/dL (ref 80–158)

## 2023-12-08 LAB — ZINC: Zinc: 66 ug/dL (ref 44–115)

## 2023-12-08 NOTE — Telephone Encounter (Signed)
 Called and given below message. She verbalized understanding.

## 2023-12-08 NOTE — Telephone Encounter (Signed)
-----   Message from Almarie Bedford sent at 12/08/2023  8:10 AM EDT ----- Pls call her, labs showed no other nutritional deficiencies. Continue her current supplements. I will schedule return in 3 months ----- Message ----- From: Interface, [facility] In [place] Sent: 12/05/2023  12:35 PM EDT To: Almarie Bedford, MD

## 2023-12-19 ENCOUNTER — Telehealth: Payer: Self-pay | Admitting: *Deleted

## 2023-12-19 MED ORDER — TRIAMCINOLONE ACETONIDE 0.1 % MT PSTE: 1.0000 | PASTE | Freq: Two times a day (BID) | OROMUCOSAL | 0 refills | Status: AC

## 2023-12-19 NOTE — Telephone Encounter (Signed)
 Copied from CRM [id number]. Topic: Clinical - Medication Question >> Dec 19, 2023  7:39 AM Ernestene P wrote: Reason for CRM: Pt is wanting a refill for the cream that was prescribed by PCP that was for ulcer in her mouth, pt advise pcp prescribe it last year but has forgotten the name. Pt prefer pharmacy is [id number] - [place], [place] - [address] AT [facility] [address] [place] [address] Phone: [phone] Fax: [phone] >> Dec 19, 2023  7:49 AM Laymon HERO wrote: Patient called back and name of medication is triamcinolone  (KENALOG ) 0.1 % paste

## 2023-12-19 NOTE — Addendum Note (Signed)
 Addended by: THURMON ARLAND PARAS on: 12/19/2023 12:19 PM   Modules accepted: Orders

## 2023-12-19 NOTE — Telephone Encounter (Signed)
 Spoke to pt told her Rx for Triamcinolone  paste Rx was sent to pharmacy. Pt verbalized understanding.

## 2023-12-19 NOTE — Telephone Encounter (Signed)
 Dr. Wendolyn, pt requesting refill for Triamcinolone  (Kenalog ) 0.1% paste for mouth sores.

## 2023-12-27 ENCOUNTER — Other Ambulatory Visit: Payer: Self-pay | Admitting: Physician Assistant

## 2024-01-16 ENCOUNTER — Ambulatory Visit (INDEPENDENT_AMBULATORY_CARE_PROVIDER_SITE_OTHER): Admitting: Student

## 2024-01-16 VITALS — BP 117/70 | HR 78 | Wt 189.0 lb

## 2024-01-16 DIAGNOSIS — M793 Panniculitis, unspecified: Secondary | ICD-10-CM

## 2024-01-16 DIAGNOSIS — D539 Nutritional anemia, unspecified: Secondary | ICD-10-CM

## 2024-01-16 MED ORDER — OXYCODONE HCL 5 MG PO TABS: 5.0000 mg | ORAL_TABLET | Freq: Four times a day (QID) | ORAL | 0 refills | Status: DC | PRN

## 2024-01-16 MED ORDER — ONDANSETRON HCL 4 MG PO TABS: 4.0000 mg | ORAL_TABLET | Freq: Three times a day (TID) | ORAL | 0 refills | Status: AC | PRN

## 2024-01-16 NOTE — H&P (View-Only) (Signed)
 Patient ID: Natasha Doyle, female    DOB: 12/05/1965, 57 y.o.   MRN: [id number]  Chief Complaint  Patient presents with   Pre-op Exam      ICD-10-CM   1. Panniculitis  M79.3     2. Deficiency anemia  D53.9 Hemoglobin    CANCELED: Hemoglobin      History of Present Illness: Natasha Doyle is a 58 y.o.  female  with a history of panniculitis.  She presents for preoperative evaluation for upcoming procedure, panniculectomy, scheduled for 02/06/2024 with Dr. Waddell.  The patient has not had problems with anesthesia.  Patient denies any history of cardiac disease.  She denies taking any blood thinners.  Patient reports she is not a smoker.  Patient denies taking any hormone replacement or birth control.  She denies any history of miscarriages.  Patient reports that her mother passed away from a PE.  She denies any personal history of blood clots.  She denies any personal or family history of clotting diseases.  She denies any recent traumas, surgeries or infections.  She denies any history of stroke or heart attack.  She denies any history of Crohn's disease or ulcerative colitis, asthma or COPD.  She denies any history of cancer.  She denies any varicosities to her lower extremities.  She denies any recent fevers, chills or changes in her health.  Summary of Previous Visit: Patient was seen for initial consult by Dr. Waddell on 11/18/2022.  At this visit, patient reported she had lost 100 pounds since undergoing bariatric surgery and had excess skin on the anterior abdominal wall which frequently became infected and caused her pain.  Patient was found to be a good candidate for panniculectomy.  Patient then switched insurance providers and was seen in our clinic again on 11/23/2023.  Patient was found to be a good candidate for panniculectomy and plan was to move forward with surgery.  Job: Works as a Engineer, structural, planning to take 3 weeks off.  Patient understands that she will restrictions  for 6 weeks.  PMH Significant for: Hypertension, GERD, anemia, panniculitis  Patient has history of anemia.  Her most recent hemoglobin was 10.9 on 12/05/2023.  Her hemoglobin before then was 11.0.  I recommended that patient continue taking her iron.  Will also check her hemoglobin prior to surgery.  She expressed understanding to this.   Past Medical History: Allergies: No Known Allergies  Current Medications:  Current Outpatient Medications:    acetaminophen -codeine  (TYLENOL  #3) 300-30 MG tablet, Take 1-2 tablets by mouth every 6 (six) hours as needed for moderate pain (pain score 4-6)., Disp: 10 tablet, Rfl: 0   albuterol  (VENTOLIN  HFA) 108 (90 Base) MCG/ACT inhaler, TAKE 2 PUFFS BY MOUTH EVERY 6 HOURS AS NEEDED FOR WHEEZE OR SHORTNESS OF BREATH, Disp: 8.5 each, Rfl: 2   B Complex Vitamins (B COMPLEX 1 PO), Take 1 tablet by mouth daily in the afternoon., Disp: , Rfl:    dicyclomine  (BENTYL ) 20 MG tablet, Take 1 tablet (20 mg total) by mouth 2 (two) times daily as needed for spasms., Disp: 30 tablet, Rfl: 0   Multiple Vitamins-Calcium (ONE-A-DAY WOMENS PO), Take 1 tablet by mouth daily., Disp: , Rfl:    ondansetron  (ZOFRAN -ODT) 4 MG disintegrating tablet, Take 1 tablet (4 mg total) by mouth every 8 (eight) hours as needed for nausea or vomiting., Disp: 20 tablet, Rfl: 0   pantoprazole  (PROTONIX ) 40 MG tablet, Take 1 tablet (40 mg total) by  mouth daily., Disp: 90 tablet, Rfl: 1   triamcinolone  (KENALOG ) 0.1 % paste, Use as directed 1 Application in the mouth or throat 2 (two) times daily., Disp: 5 g, Rfl: 0   Vitamin E 45 MG (100 UNIT) CAPS, Take 1 capsule by mouth daily in the afternoon., Disp: , Rfl:   Past Medical Problems: Past Medical History:  Diagnosis Date   Deficiency anemia 12/05/2023   Diabetes mellitus without complication (HCC)    borderline   GERD (gastroesophageal reflux disease)    Hypertension    Obesity    Vaginal delivery 1989, 1992    Past Surgical  History: Past Surgical History:  Procedure Laterality Date   BARIATRIC SURGERY     BREATH TEK H PYLORI N/A 10/22/2014   Procedure: BREATH TEK H PYLORI;  Surgeon: Camellia Blush, MD;  Location: [facility];  Service: General;  Laterality: N/A;   CHOLECYSTECTOMY     HERNIA REPAIR     HIATAL HERNIA REPAIR  01/04/2020   LAPAROSCOPIC GASTRIC SLEEVE RESECTION N/A 04/29/2015   Procedure: LAPAROSCOPIC GASTRIC SLEEVE RESECTION;  Surgeon: Thom CHRISTELLA Pin, MD;  Location: [facility];  Service: General;  Laterality: N/A;    Social History: Social History   Socioeconomic History   Marital status: Single    Spouse name: Not on file   Number of children: Not on file   Years of education: Not on file   Highest education level: 12th grade  Occupational History   Not on file  Tobacco Use   Smoking status: Never   Smokeless tobacco: Never  Vaping Use   Vaping status: Never Used  Substance and Sexual Activity   Alcohol use: Not Currently   Drug use: No   Sexual activity: Not Currently    Birth control/protection: Post-menopausal  Other Topics Concern   Not on file  Social History Narrative   Nursing Assistant   Social Drivers of Health   Financial Resource Strain: Low Risk  (11/14/2023)   Overall Financial Resource Strain (CARDIA)    Difficulty of Paying Living Expenses: Not very hard  Food Insecurity: No Food Insecurity (11/14/2023)   Hunger Vital Sign    Worried About Running Out of Food in the Last Year: Never true    Ran Out of Food in the Last Year: Never true  Transportation Needs: No Transportation Needs (11/14/2023)   PRAPARE - Administrator, Civil Service (Medical): No    Lack of Transportation (Non-Medical): No  Physical Activity: Insufficiently Active (11/14/2023)   Exercise Vital Sign    Days of Exercise per Week: 3 days    Minutes of Exercise per Session: 30 min  Stress: No Stress Concern Present (11/14/2023)   [employer] of Occupational Health - Occupational Stress  Questionnaire    Feeling of Stress : Not at all  Social Connections: Moderately Isolated (11/14/2023)   Social Connection and Isolation Panel    Frequency of Communication with Friends and Family: More than three times a week    Frequency of Social Gatherings with Friends and Family: More than three times a week    Attends Religious Services: More than 4 times per year    Active Member of [employer] or Organizations: No    Attends Engineer, structural: Not on file    Marital Status: Never married  Intimate Partner Violence: Not on file    Family History: Family History  Problem Relation Age of Onset   Cancer Mother  breast   Hypertension Mother    Hyperlipidemia Mother    Alcohol abuse Father    Breast cancer Sister    COPD Sister    Breast cancer Sister    Diabetes Sister    Heart disease Brother    Hyperlipidemia Brother    Hypertension Brother     Review of Systems: Denies any recent fevers, chills or changes in health  Physical Exam: Vital Signs BP 117/70 (BP Location: Left Arm, Patient Position: Sitting, Cuff Size: Normal)   Pulse 78   Wt 189 lb (85.7 kg)   LMP 09/03/2012   SpO2 100%   BMI 32.44 kg/m   Physical Exam  Constitutional:      General: Not in acute distress.    Appearance: Normal appearance. Not ill-appearing.  HENT:     Head: Normocephalic and atraumatic.  Neck:     Musculoskeletal: Normal range of motion.  Cardiovascular:     Rate and Rhythm: Normal rate Pulmonary:     Effort: Pulmonary effort is normal. No respiratory distress.  Musculoskeletal: Normal range of motion.  Skin:    General: Skin is warm and dry.     Findings: No erythema or rash.  Neurological:     Mental Status: Alert and oriented to person, place, and time. Mental status is at baseline.  Psychiatric:        Mood and Affect: Mood normal.        Behavior: Behavior normal.    Assessment/Plan: The patient is scheduled for panniculectomy with Dr. Waddell.  Risks,  benefits, and alternatives of procedure discussed, questions answered and consent obtained.    Smoking Status: Non-smoker  Caprini Score: 7; Risk Factors include: Age, BMI > 25, family history of thrombosis and length of planned surgery. Recommendation for mechanical and possible pharmacological  prophylaxis. Will discuss possibility of postoperative lovenox  with Dr. Waddell. Encourage early ambulation.   Pictures obtained: 11/23/23  Post-op Rx sent to pharmacy: Oxycodone , Zofran   I instructed the patient to hold any multivitamins or supplements at least 1 week prior to surgery.  Patient expressed understanding.  Patient was provided with the General Surgical Risk consent document and Pain Medication Agreement prior to their appointment.  They had adequate time to read through the risk consent documents and Pain Medication Agreement. We also discussed them in person together during this preop appointment. All of their questions were answered to their satisfaction.  Recommended calling if they have any further questions.  Risk consent form and Pain Medication Agreement to be scanned into patient's chart.  The consent was obtained with risks and complications reviewed which included bleeding, pain, scar, infection and the risk of anesthesia.  The patients questions were answered to the patients expressed satisfaction.    Electronically signed by: Estefana FORBES Peck, PA-C 01/16/2024 12:03 PM

## 2024-01-16 NOTE — Progress Notes (Signed)
 Patient ID: Natasha Doyle, female    DOB: 12/05/1965, 57 y.o.   MRN: [id number]  Chief Complaint  Patient presents with   Pre-op Exam      ICD-10-CM   1. Panniculitis  M79.3     2. Deficiency anemia  D53.9 Hemoglobin    CANCELED: Hemoglobin      History of Present Illness: Natasha Doyle is a 58 y.o.  female  with a history of panniculitis.  She presents for preoperative evaluation for upcoming procedure, panniculectomy, scheduled for 02/06/2024 with Dr. Waddell.  The patient has not had problems with anesthesia.  Patient denies any history of cardiac disease.  She denies taking any blood thinners.  Patient reports she is not a smoker.  Patient denies taking any hormone replacement or birth control.  She denies any history of miscarriages.  Patient reports that her mother passed away from a PE.  She denies any personal history of blood clots.  She denies any personal or family history of clotting diseases.  She denies any recent traumas, surgeries or infections.  She denies any history of stroke or heart attack.  She denies any history of Crohn's disease or ulcerative colitis, asthma or COPD.  She denies any history of cancer.  She denies any varicosities to her lower extremities.  She denies any recent fevers, chills or changes in her health.  Summary of Previous Visit: Patient was seen for initial consult by Dr. Waddell on 11/18/2022.  At this visit, patient reported she had lost 100 pounds since undergoing bariatric surgery and had excess skin on the anterior abdominal wall which frequently became infected and caused her pain.  Patient was found to be a good candidate for panniculectomy.  Patient then switched insurance providers and was seen in our clinic again on 11/23/2023.  Patient was found to be a good candidate for panniculectomy and plan was to move forward with surgery.  Job: Works as a Engineer, structural, planning to take 3 weeks off.  Patient understands that she will restrictions  for 6 weeks.  PMH Significant for: Hypertension, GERD, anemia, panniculitis  Patient has history of anemia.  Her most recent hemoglobin was 10.9 on 12/05/2023.  Her hemoglobin before then was 11.0.  I recommended that patient continue taking her iron.  Will also check her hemoglobin prior to surgery.  She expressed understanding to this.   Past Medical History: Allergies: No Known Allergies  Current Medications:  Current Outpatient Medications:    acetaminophen -codeine  (TYLENOL  #3) 300-30 MG tablet, Take 1-2 tablets by mouth every 6 (six) hours as needed for moderate pain (pain score 4-6)., Disp: 10 tablet, Rfl: 0   albuterol  (VENTOLIN  HFA) 108 (90 Base) MCG/ACT inhaler, TAKE 2 PUFFS BY MOUTH EVERY 6 HOURS AS NEEDED FOR WHEEZE OR SHORTNESS OF BREATH, Disp: 8.5 each, Rfl: 2   B Complex Vitamins (B COMPLEX 1 PO), Take 1 tablet by mouth daily in the afternoon., Disp: , Rfl:    dicyclomine  (BENTYL ) 20 MG tablet, Take 1 tablet (20 mg total) by mouth 2 (two) times daily as needed for spasms., Disp: 30 tablet, Rfl: 0   Multiple Vitamins-Calcium (ONE-A-DAY WOMENS PO), Take 1 tablet by mouth daily., Disp: , Rfl:    ondansetron  (ZOFRAN -ODT) 4 MG disintegrating tablet, Take 1 tablet (4 mg total) by mouth every 8 (eight) hours as needed for nausea or vomiting., Disp: 20 tablet, Rfl: 0   pantoprazole  (PROTONIX ) 40 MG tablet, Take 1 tablet (40 mg total) by  mouth daily., Disp: 90 tablet, Rfl: 1   triamcinolone  (KENALOG ) 0.1 % paste, Use as directed 1 Application in the mouth or throat 2 (two) times daily., Disp: 5 g, Rfl: 0   Vitamin E 45 MG (100 UNIT) CAPS, Take 1 capsule by mouth daily in the afternoon., Disp: , Rfl:   Past Medical Problems: Past Medical History:  Diagnosis Date   Deficiency anemia 12/05/2023   Diabetes mellitus without complication (HCC)    borderline   GERD (gastroesophageal reflux disease)    Hypertension    Obesity    Vaginal delivery 1989, 1992    Past Surgical  History: Past Surgical History:  Procedure Laterality Date   BARIATRIC SURGERY     BREATH TEK H PYLORI N/A 10/22/2014   Procedure: BREATH TEK H PYLORI;  Surgeon: Camellia Blush, MD;  Location: [facility];  Service: General;  Laterality: N/A;   CHOLECYSTECTOMY     HERNIA REPAIR     HIATAL HERNIA REPAIR  01/04/2020   LAPAROSCOPIC GASTRIC SLEEVE RESECTION N/A 04/29/2015   Procedure: LAPAROSCOPIC GASTRIC SLEEVE RESECTION;  Surgeon: Thom CHRISTELLA Pin, MD;  Location: [facility];  Service: General;  Laterality: N/A;    Social History: Social History   Socioeconomic History   Marital status: Single    Spouse name: Not on file   Number of children: Not on file   Years of education: Not on file   Highest education level: 12th grade  Occupational History   Not on file  Tobacco Use   Smoking status: Never   Smokeless tobacco: Never  Vaping Use   Vaping status: Never Used  Substance and Sexual Activity   Alcohol use: Not Currently   Drug use: No   Sexual activity: Not Currently    Birth control/protection: Post-menopausal  Other Topics Concern   Not on file  Social History Narrative   Nursing Assistant   Social Drivers of Health   Financial Resource Strain: Low Risk  (11/14/2023)   Overall Financial Resource Strain (CARDIA)    Difficulty of Paying Living Expenses: Not very hard  Food Insecurity: No Food Insecurity (11/14/2023)   Hunger Vital Sign    Worried About Running Out of Food in the Last Year: Never true    Ran Out of Food in the Last Year: Never true  Transportation Needs: No Transportation Needs (11/14/2023)   PRAPARE - Administrator, Civil Service (Medical): No    Lack of Transportation (Non-Medical): No  Physical Activity: Insufficiently Active (11/14/2023)   Exercise Vital Sign    Days of Exercise per Week: 3 days    Minutes of Exercise per Session: 30 min  Stress: No Stress Concern Present (11/14/2023)   [employer] of Occupational Health - Occupational Stress  Questionnaire    Feeling of Stress : Not at all  Social Connections: Moderately Isolated (11/14/2023)   Social Connection and Isolation Panel    Frequency of Communication with Friends and Family: More than three times a week    Frequency of Social Gatherings with Friends and Family: More than three times a week    Attends Religious Services: More than 4 times per year    Active Member of [employer] or Organizations: No    Attends Engineer, structural: Not on file    Marital Status: Never married  Intimate Partner Violence: Not on file    Family History: Family History  Problem Relation Age of Onset   Cancer Mother  breast   Hypertension Mother    Hyperlipidemia Mother    Alcohol abuse Father    Breast cancer Sister    COPD Sister    Breast cancer Sister    Diabetes Sister    Heart disease Brother    Hyperlipidemia Brother    Hypertension Brother     Review of Systems: Denies any recent fevers, chills or changes in health  Physical Exam: Vital Signs BP 117/70 (BP Location: Left Arm, Patient Position: Sitting, Cuff Size: Normal)   Pulse 78   Wt 189 lb (85.7 kg)   LMP 09/03/2012   SpO2 100%   BMI 32.44 kg/m   Physical Exam  Constitutional:      General: Not in acute distress.    Appearance: Normal appearance. Not ill-appearing.  HENT:     Head: Normocephalic and atraumatic.  Neck:     Musculoskeletal: Normal range of motion.  Cardiovascular:     Rate and Rhythm: Normal rate Pulmonary:     Effort: Pulmonary effort is normal. No respiratory distress.  Musculoskeletal: Normal range of motion.  Skin:    General: Skin is warm and dry.     Findings: No erythema or rash.  Neurological:     Mental Status: Alert and oriented to person, place, and time. Mental status is at baseline.  Psychiatric:        Mood and Affect: Mood normal.        Behavior: Behavior normal.    Assessment/Plan: The patient is scheduled for panniculectomy with Dr. Waddell.  Risks,  benefits, and alternatives of procedure discussed, questions answered and consent obtained.    Smoking Status: Non-smoker  Caprini Score: 7; Risk Factors include: Age, BMI > 25, family history of thrombosis and length of planned surgery. Recommendation for mechanical and possible pharmacological  prophylaxis. Will discuss possibility of postoperative lovenox  with Dr. Waddell. Encourage early ambulation.   Pictures obtained: 11/23/23  Post-op Rx sent to pharmacy: Oxycodone , Zofran   I instructed the patient to hold any multivitamins or supplements at least 1 week prior to surgery.  Patient expressed understanding.  Patient was provided with the General Surgical Risk consent document and Pain Medication Agreement prior to their appointment.  They had adequate time to read through the risk consent documents and Pain Medication Agreement. We also discussed them in person together during this preop appointment. All of their questions were answered to their satisfaction.  Recommended calling if they have any further questions.  Risk consent form and Pain Medication Agreement to be scanned into patient's chart.  The consent was obtained with risks and complications reviewed which included bleeding, pain, scar, infection and the risk of anesthesia.  The patients questions were answered to the patients expressed satisfaction.    Electronically signed by: Estefana FORBES Peck, PA-C 01/16/2024 12:03 PM

## 2024-01-30 ENCOUNTER — Encounter (HOSPITAL_BASED_OUTPATIENT_CLINIC_OR_DEPARTMENT_OTHER)
Admission: RE | Admit: 2024-01-30 | Discharge: 2024-01-30 | Disposition: A | Discharge status: Home / Self Care | Source: Ambulatory Visit | Attending: Plastic Surgery | Admitting: Plastic Surgery

## 2024-01-30 ENCOUNTER — Encounter (HOSPITAL_BASED_OUTPATIENT_CLINIC_OR_DEPARTMENT_OTHER): Payer: Self-pay | Admitting: Plastic Surgery

## 2024-01-30 DIAGNOSIS — Z01810 Encounter for preprocedural cardiovascular examination: Secondary | ICD-10-CM | POA: Insufficient documentation

## 2024-01-30 NOTE — Progress Notes (Signed)
Surgical soap given with instructions, pt verbalized understanding.  

## 2024-01-31 LAB — HEMOGLOBIN: Hemoglobin: 10.6 g/dL — ABNORMAL LOW (ref 11.1–15.9)

## 2024-02-03 DIAGNOSIS — Z719 Counseling, unspecified: Secondary | ICD-10-CM

## 2024-02-06 ENCOUNTER — Ambulatory Visit (HOSPITAL_BASED_OUTPATIENT_CLINIC_OR_DEPARTMENT_OTHER)
Admission: RE | Admit: 2024-02-06 | Discharge: 2024-02-06 | Disposition: A | Discharge status: Home / Self Care | Attending: Plastic Surgery | Admitting: Plastic Surgery

## 2024-02-06 ENCOUNTER — Ambulatory Visit (HOSPITAL_BASED_OUTPATIENT_CLINIC_OR_DEPARTMENT_OTHER): Admitting: Anesthesiology

## 2024-02-06 ENCOUNTER — Encounter (HOSPITAL_BASED_OUTPATIENT_CLINIC_OR_DEPARTMENT_OTHER)
Admission: RE | Disposition: A | Discharge status: Home / Self Care | Payer: Self-pay | Source: Home / Self Care | Attending: Plastic Surgery

## 2024-02-06 ENCOUNTER — Encounter (HOSPITAL_BASED_OUTPATIENT_CLINIC_OR_DEPARTMENT_OTHER): Payer: Self-pay | Admitting: Plastic Surgery

## 2024-02-06 ENCOUNTER — Other Ambulatory Visit: Payer: Self-pay

## 2024-02-06 DIAGNOSIS — D539 Nutritional anemia, unspecified: Secondary | ICD-10-CM | POA: Insufficient documentation

## 2024-02-06 DIAGNOSIS — I10 Essential (primary) hypertension: Secondary | ICD-10-CM | POA: Diagnosis not present

## 2024-02-06 DIAGNOSIS — Z01818 Encounter for other preprocedural examination: Secondary | ICD-10-CM

## 2024-02-06 DIAGNOSIS — M793 Panniculitis, unspecified: Secondary | ICD-10-CM | POA: Insufficient documentation

## 2024-02-06 DIAGNOSIS — K449 Diaphragmatic hernia without obstruction or gangrene: Secondary | ICD-10-CM | POA: Diagnosis not present

## 2024-02-06 DIAGNOSIS — K219 Gastro-esophageal reflux disease without esophagitis: Secondary | ICD-10-CM | POA: Insufficient documentation

## 2024-02-06 DIAGNOSIS — Z79899 Other long term (current) drug therapy: Secondary | ICD-10-CM | POA: Insufficient documentation

## 2024-02-06 DIAGNOSIS — L987 Excessive and redundant skin and subcutaneous tissue: Secondary | ICD-10-CM | POA: Diagnosis not present

## 2024-02-06 HISTORY — PX: PANNICULECTOMY: SHX5360

## 2024-02-06 SURGERY — PANNICULECTOMY
Anesthesia: General | Site: Abdomen

## 2024-02-06 MED ORDER — HYDROMORPHONE HCL 1 MG/ML IJ SOLN
INTRAMUSCULAR | Status: AC
  Filled 2024-02-06: qty 0.5

## 2024-02-06 MED ORDER — DEXMEDETOMIDINE HCL IN NACL 80 MCG/20ML IV SOLN
INTRAVENOUS | Status: DC | PRN
Start: 2024-02-06 — End: 2024-02-06
  Administered 2024-02-06: 8 ug via INTRAVENOUS
  Administered 2024-02-06: 4 ug via INTRAVENOUS

## 2024-02-06 MED ORDER — MIDAZOLAM HCL 2 MG/2ML IJ SOLN
INTRAMUSCULAR | Status: DC | PRN
  Administered 2024-02-06: 2 mg via INTRAVENOUS

## 2024-02-06 MED ORDER — DEXMEDETOMIDINE HCL IN NACL 80 MCG/20ML IV SOLN
INTRAVENOUS | Status: AC
  Filled 2024-02-06: qty 20

## 2024-02-06 MED ORDER — SUGAMMADEX SODIUM 200 MG/2ML IV SOLN
INTRAVENOUS | Status: DC | PRN
  Administered 2024-02-06: 180 mg via INTRAVENOUS

## 2024-02-06 MED ORDER — FENTANYL CITRATE (PF) 100 MCG/2ML IJ SOLN
INTRAMUSCULAR | Status: AC
  Filled 2024-02-06: qty 2

## 2024-02-06 MED ORDER — 0.9 % SODIUM CHLORIDE (POUR BTL) OPTIME
TOPICAL | Status: DC | PRN
  Administered 2024-02-06 (×2): 1000 mL

## 2024-02-06 MED ORDER — ROCURONIUM BROMIDE 100 MG/10ML IV SOLN
INTRAVENOUS | Status: DC | PRN
  Administered 2024-02-06: 60 mg via INTRAVENOUS

## 2024-02-06 MED ORDER — CHLORHEXIDINE GLUCONATE CLOTH 2 % EX PADS: 6.0000 | MEDICATED_PAD | Freq: Once | CUTANEOUS | Status: DC

## 2024-02-06 MED ORDER — SODIUM CHLORIDE (PF) 0.9 % IJ SOLN
INTRAMUSCULAR | Status: AC
  Filled 2024-02-06: qty 50

## 2024-02-06 MED ORDER — PROPOFOL 500 MG/50ML IV EMUL
INTRAVENOUS | Status: DC | PRN
  Administered 2024-02-06: 150 ug/kg/min via INTRAVENOUS

## 2024-02-06 MED ORDER — CEFAZOLIN SODIUM-DEXTROSE 2-4 GM/100ML-% IV SOLN
2.0000 g | INTRAVENOUS | Status: AC
  Administered 2024-02-06: 2 g via INTRAVENOUS

## 2024-02-06 MED ORDER — ONDANSETRON HCL 4 MG/2ML IJ SOLN
INTRAMUSCULAR | Status: DC | PRN
  Administered 2024-02-06: 4 mg via INTRAVENOUS

## 2024-02-06 MED ORDER — ACETAMINOPHEN 500 MG PO TABS
1000.0000 mg | ORAL_TABLET | Freq: Once | ORAL | Status: AC
  Administered 2024-02-06: 1000 mg via ORAL

## 2024-02-06 MED ORDER — BUPIVACAINE LIPOSOME 1.3 % IJ SUSP
INTRAMUSCULAR | Status: AC
  Filled 2024-02-06: qty 20

## 2024-02-06 MED ORDER — LIDOCAINE HCL (CARDIAC) PF 100 MG/5ML IV SOSY
PREFILLED_SYRINGE | INTRAVENOUS | Status: DC | PRN
  Administered 2024-02-06: 100 mg via INTRAVENOUS

## 2024-02-06 MED ORDER — HYDROMORPHONE HCL 1 MG/ML IJ SOLN
0.2500 mg | INTRAMUSCULAR | Status: DC | PRN
  Administered 2024-02-06: 0.5 mg via INTRAVENOUS

## 2024-02-06 MED ORDER — ACETAMINOPHEN 500 MG PO TABS
ORAL_TABLET | ORAL | Status: AC
  Filled 2024-02-06: qty 2

## 2024-02-06 MED ORDER — MIDAZOLAM HCL 2 MG/2ML IJ SOLN
INTRAMUSCULAR | Status: AC
  Filled 2024-02-06: qty 2

## 2024-02-06 MED ORDER — LACTATED RINGERS IV SOLN: INTRAVENOUS | Status: DC

## 2024-02-06 MED ORDER — HYDROMORPHONE HCL 1 MG/ML IJ SOLN
INTRAMUSCULAR | Status: AC
Start: 2024-02-06 — End: 2024-02-06
  Filled 2024-02-06: qty 0.5

## 2024-02-06 MED ORDER — PROPOFOL 10 MG/ML IV BOLUS
INTRAVENOUS | Status: DC | PRN
  Administered 2024-02-06: 180 mg via INTRAVENOUS

## 2024-02-06 MED ORDER — FENTANYL CITRATE (PF) 100 MCG/2ML IJ SOLN
INTRAMUSCULAR | Status: DC | PRN
  Administered 2024-02-06: 100 ug via INTRAVENOUS
  Administered 2024-02-06 (×2): 50 ug via INTRAVENOUS

## 2024-02-06 MED ORDER — HYDROMORPHONE HCL 1 MG/ML IJ SOLN
INTRAMUSCULAR | Status: DC | PRN
  Administered 2024-02-06: .5 mg via INTRAVENOUS

## 2024-02-06 MED ORDER — DROPERIDOL 2.5 MG/ML IJ SOLN: 0.6250 mg | Freq: Once | INTRAMUSCULAR | Status: DC | PRN

## 2024-02-06 MED ORDER — SODIUM CHLORIDE (PF) 0.9 % IJ SOLN
INTRAMUSCULAR | Status: DC | PRN
  Administered 2024-02-06: 100 mL

## 2024-02-06 MED ORDER — DEXAMETHASONE SODIUM PHOSPHATE 4 MG/ML IJ SOLN
INTRAMUSCULAR | Status: DC | PRN
  Administered 2024-02-06: 5 mg via INTRAVENOUS

## 2024-02-06 MED ORDER — CEFAZOLIN SODIUM-DEXTROSE 2-4 GM/100ML-% IV SOLN
INTRAVENOUS | Status: AC
  Filled 2024-02-06: qty 100

## 2024-02-06 SURGICAL SUPPLY — 53 items
BINDER ABDOMINAL 12 ML 46-62 (SOFTGOODS) IMPLANT
BINDER ABDOMINAL 12 SM 30-45 (SOFTGOODS) IMPLANT
BIOPATCH RED 1 DISK 7.0 (GAUZE/BANDAGES/DRESSINGS) ×4 IMPLANT
BLADE CLIPPER SURG (BLADE) IMPLANT
BLADE SURG 10 STRL SS (BLADE) ×8 IMPLANT
BLADE SURG 11 STRL SS (BLADE) IMPLANT
BLADE SURG 15 STRL LF DISP TIS (BLADE) ×2 IMPLANT
CLIP APPLIE 9.375 MED OPEN (MISCELLANEOUS) IMPLANT
DERMABOND ADVANCED .7 DNX12 (GAUZE/BANDAGES/DRESSINGS) ×4 IMPLANT
DRAIN CHANNEL 19F RND (DRAIN) ×4 IMPLANT
DRAPE UTILITY XL STRL (DRAPES) ×2 IMPLANT
DRSG TEGADERM 4X4.75 (GAUZE/BANDAGES/DRESSINGS) ×4 IMPLANT
ELECT COATED BLADE 2.86 ST (ELECTRODE) IMPLANT
ELECTRODE BLDE 4.0 EZ CLN MEGD (MISCELLANEOUS) ×2 IMPLANT
ELECTRODE REM PT RTRN 9FT ADLT (ELECTROSURGICAL) ×4 IMPLANT
EVACUATOR SILICONE 100CC (DRAIN) ×4 IMPLANT
GAUZE PAD ABD 8X10 STRL (GAUZE/BANDAGES/DRESSINGS) ×4 IMPLANT
GAUZE SPONGE 2X2 STRL 8-PLY (GAUZE/BANDAGES/DRESSINGS) ×4 IMPLANT
GLOVE BIO SURGEON STRL SZ 6.5 (GLOVE) IMPLANT
GLOVE BIO SURGEON STRL SZ7.5 (GLOVE) ×2 IMPLANT
GLOVE BIO SURGEON STRL SZ8 (GLOVE) ×4 IMPLANT
GLOVE BIOGEL PI IND STRL 7.0 (GLOVE) IMPLANT
GLOVE BIOGEL PI IND STRL 8 (GLOVE) ×4 IMPLANT
GOWN STRL REUS W/ TWL LRG LVL3 (GOWN DISPOSABLE) ×2 IMPLANT
GOWN STRL REUS W/TWL XL LVL3 (GOWN DISPOSABLE) ×4 IMPLANT
HIBICLENS CHG 4% 4OZ BTL (MISCELLANEOUS) ×2 IMPLANT
MANIFOLD NEPTUNE II (INSTRUMENTS) ×2 IMPLANT
NDL HYPO 22X1.5 SAFETY MO (MISCELLANEOUS) ×4 IMPLANT
NEEDLE HYPO 22X1.5 SAFETY MO (MISCELLANEOUS) ×2 IMPLANT
NS IRRIG 1000ML POUR BTL (IV SOLUTION) ×2 IMPLANT
PACK BASIN DAY SURGERY FS (CUSTOM PROCEDURE TRAY) ×2 IMPLANT
PACK UNIVERSAL I (CUSTOM PROCEDURE TRAY) ×2 IMPLANT
PENCIL SMOKE EVACUATOR (MISCELLANEOUS) ×4 IMPLANT
PIN SAFETY STERILE (MISCELLANEOUS) ×2 IMPLANT
SLEEVE SCD COMPRESS KNEE MED (STOCKING) ×2 IMPLANT
SPONGE T-LAP 18X18 ~~LOC~~+RFID (SPONGE) ×4 IMPLANT
STAPLER INSORB 30 2030 C-SECTI (MISCELLANEOUS) IMPLANT
STAPLER SKIN PROX WIDE 3.9 (STAPLE) ×2 IMPLANT
SUT MNCRL AB 3-0 PS2 27 (SUTURE) ×8 IMPLANT
SUT MNCRL AB 4-0 PS2 18 (SUTURE) ×8 IMPLANT
SUT SILK 2 0 PERMA HAND 18 BK (SUTURE) ×4 IMPLANT
SUT VIC AB 2-0 CT1 TAPERPNT 27 (SUTURE) ×8 IMPLANT
SUT VIC AB 3-0 PS1 18XBRD (SUTURE) IMPLANT
SUT VIC AB 3-0 SH 27X BRD (SUTURE) ×2 IMPLANT
SUT VICRYL AB 3 0 TIES (SUTURE) IMPLANT
SYR 20ML LL LF (SYRINGE) ×4 IMPLANT
SYR BULB IRRIG 60ML STRL (SYRINGE) ×2 IMPLANT
SYR CONTROL 10ML LL (SYRINGE) ×2 IMPLANT
TOWEL GREEN STERILE FF (TOWEL DISPOSABLE) ×4 IMPLANT
TRAY DSU PREP LF (CUSTOM PROCEDURE TRAY) ×2 IMPLANT
TUBE CONNECTING 20X1/4 (TUBING) ×2 IMPLANT
UNDERPAD 30X36 HEAVY ABSORB (UNDERPADS AND DIAPERS) ×4 IMPLANT
YANKAUER SUCT BULB TIP NO VENT (SUCTIONS) ×2 IMPLANT

## 2024-02-06 NOTE — Anesthesia Postprocedure Evaluation (Signed)
 Anesthesia Post Note  Patient: Natasha Doyle  Procedure(s) Performed: PANNICULECTOMY (Abdomen)     Patient location during evaluation: PACU Anesthesia Type: General Level of consciousness: awake and alert Pain management: pain level controlled Vital Signs Assessment: post-procedure vital signs reviewed and stable Respiratory status: spontaneous breathing, nonlabored ventilation and respiratory function stable Cardiovascular status: blood pressure returned to baseline Postop Assessment: no apparent nausea or vomiting Anesthetic complications: no   No notable events documented.  Last Vitals:  Vitals:   02/06/24 1515 02/06/24 1530  BP: (!) 155/78 (!) 168/86  Pulse: 61 62  Resp: 11 14  Temp:    SpO2: 100% 100%    Last Pain:  Vitals:   02/06/24 1529  TempSrc:   PainSc: 6                  Vertell Row

## 2024-02-06 NOTE — Op Note (Signed)
 DATE OF OPERATION: 02/06/2024  LOCATION: [facility] operating Room  PREOPERATIVE DIAGNOSIS: Panniculitis  POSTOPERATIVE DIAGNOSIS: Same  PROCEDURE: Panniculectomy  SURGEON: Marinell Velencia Lenart,MD  ASSISTANT: Honora Seip  EBL: 50 cc  CONDITION: Stable  COMPLICATIONS: None  INDICATION: The patient, Natasha Doyle, is a 58 y.o. female born on Feb 17, 1966, is here for treatment of ongoing rashes on the posterior aspect of the pannus.   PROCEDURE DETAILS:  The patient was seen prior to surgery and marked.  The IV antibiotics were given. The patient was taken to the operating room and given a general anesthetic. A standard time out was performed and all information was confirmed by those in the room. SCDs were placed.   The abdomen was prepped and draped in usual sterile manner.  A transverse incision was made across the lower portion of the abdomen sharply and electrocautery was used to dissect the soft tissues down to the anterior abdominal wall fascia.  The skin and fat was then elevated in a caudad to cranial fashion to the level of the umbilicus.  A counterincision point was identified and incised sharply removing the pannus.  Meticulous hemostasis was achieved with the electrocautery.  The subfascial space over the rectus and the external obliques as well as the subcutaneous tissues were infiltrated with a mixture of quarter percent Marcaine , Exparel , and saline.  219 French round drains were placed in the surgical bed and brought out through separate stab incisions.  Skin edges were tailor tacked in place with skin clips and Scarpa's fascia was approximated with interrupted 2-0 Vicryl sutures.  Dermis was closed with interrupted and running 3-0 Monocryl sutures and the skin was closed with running 4-0 Monocryl subcuticular stitch.  The incision was sealed with Dermabond and the patient was placed in a compressive garment.  She was awakened from anesthesia without incident transferred to the  recovery room in good condition.  All instrument needle and sponge counts were reported as correct and no complications were appreciated during the procedure. The patient was allowed to wake up and taken to recovery room in stable condition at the end of the case. The family was notified at the end of the case.   The advanced practice practitioner (APP) assisted throughout the case.  The APP was essential in retraction and counter traction when needed to make the case progress smoothly.  This retraction and assistance made it possible to see the tissue plans for the procedure.  The assistance was needed for blood control, tissue re-approximation and assisted with closure of the incision site.

## 2024-02-06 NOTE — Interval H&P Note (Signed)
 History and Physical Interval Note: No change in exam or indication for surgery All questions answered Marked for a panniculectomy with her concurrence Will proceed at her request  02/06/2024 12:34 PM  Natasha Doyle  has presented today for surgery, with the diagnosis of m79.3.  The various methods of treatment have been discussed with the patient and family. After consideration of risks, benefits and other options for treatment, the patient has consented to  Procedure(s): PANNICULECTOMY (N/A) as a surgical intervention.  The patient's history has been reviewed, patient examined, no change in status, stable for surgery.  I have reviewed the patient's chart and labs.  Questions were answered to the patient's satisfaction.     Leonce KATHEE [place]

## 2024-02-06 NOTE — Anesthesia Procedure Notes (Signed)
 Procedure Name: Intubation Date/Time: 02/06/2024 1:05 PM  Performed by: Paul Lamarr BRAVO, MDPre-anesthesia Checklist: Patient identified, Emergency Drugs available, Suction available and Patient being monitored Patient Re-evaluated:Patient Re-evaluated prior to induction Oxygen Delivery Method: Circle System Utilized Preoxygenation: Pre-oxygenation with 100% oxygen Induction Type: IV induction Ventilation: Mask ventilation without difficulty Laryngoscope Size: Mac and 3 Grade View: Grade I Tube type: Oral Tube size: 7.0 mm Number of attempts: 2 Airway Equipment and Method: Stylet Placement Confirmation: ETT inserted through vocal cords under direct vision, positive ETCO2 and breath sounds checked- equal and bilateral Secured at: 22 cm Tube secured with: Tape Dental Injury: Teeth and Oropharynx as per pre-operative assessment  Comments: First intubation by medical student with esophageal intubation, immediately recognized and ETT replaced into trachea. Atraumatic intubation. Pre-existing loose upper teeth with significant periodontal disease noted, no changes post-intubation. Lawence, MD

## 2024-02-06 NOTE — Anesthesia Preprocedure Evaluation (Addendum)
 Anesthesia Evaluation  Patient identified by MRN, date of birth, ID band Patient awake    Reviewed: Allergy & Precautions, NPO status , Patient's Chart, lab work & pertinent test results  History of Anesthesia Complications Negative for: history of anesthetic complications  Airway Mallampati: II  TM Distance: >3 FB Neck ROM: Full    Dental  (+) Partial Lower, Poor Dentition,    Pulmonary neg pulmonary ROS   Pulmonary exam normal        Cardiovascular hypertension, Pt. on medications Normal cardiovascular exam     Neuro/Psych negative neurological ROS     GI/Hepatic Neg liver ROS, hiatal hernia,GERD  Medicated and Controlled,,  Endo/Other  negative endocrine ROS    Renal/GU negative Renal ROS     Musculoskeletal negative musculoskeletal ROS (+)    Abdominal   Peds  Hematology  (+) Blood dyscrasia (Hgb 10.6), anemia   Anesthesia Other Findings Day of surgery medications reviewed with patient.  Reproductive/Obstetrics                              Anesthesia Physical Anesthesia Plan  ASA: 2  Anesthesia Plan: General   Post-op Pain Management: Tylenol  PO (pre-op)* and Dilaudid  IV   Induction: Intravenous  PONV Risk Score and Plan: 3 and Treatment may vary due to age or medical condition, Ondansetron , Dexamethasone  and Midazolam   Airway Management Planned: Oral ETT  Additional Equipment: None  Intra-op Plan:   Post-operative Plan: Extubation in OR  Informed Consent: I have reviewed the patients History and Physical, chart, labs and discussed the procedure including the risks, benefits and alternatives for the proposed anesthesia with the patient or authorized representative who has indicated his/her understanding and acceptance.     Dental advisory given  Plan Discussed with: CRNA  Anesthesia Plan Comments:          Anesthesia Quick Evaluation

## 2024-02-06 NOTE — Transfer of Care (Signed)
 Immediate Anesthesia Transfer of Care Note  Patient: Natasha Doyle  Procedure(s) Performed: PANNICULECTOMY (Abdomen)  Patient Location: PACU  Anesthesia Type:General  Level of Consciousness: awake, alert , and patient cooperative  Airway & Oxygen Therapy: Patient Spontanous Breathing and Patient connected to nasal cannula oxygen  Post-op Assessment: Report given to RN and Post -op Vital signs reviewed and stable  Post vital signs: Reviewed and stable  Last Vitals:  Vitals Value Taken Time  BP 161/84 02/06/24 15:01  Temp    Pulse 69 02/06/24 15:06  Resp 20 02/06/24 15:06  SpO2 100 % 02/06/24 15:06  Vitals shown include unfiled device data.  Last Pain:  Vitals:   02/06/24 1156  TempSrc: Temporal  PainSc: 0-No pain      Patients Stated Pain Goal: 3 (02/06/24 1156)  Complications: No notable events documented.

## 2024-02-06 NOTE — Discharge Instructions (Addendum)
 Activity As tolerated: NO showers until 3 days after surgery. Keep binder on 24/7 unless showering or changing dressings. This is important to prevent additional swelling.  NO driving while in pain, taking pain medication or if you are unable to safely react to traffic. No heavy activities. No lifting > 15 pounds. No abdominal or core exercise until incision is well-healed.  Recommend that you sleep with a slight flex at your hips. You can place a pillow underneath your knees as well as behind your back to take pressure off of the incision.  Please take Tylenol  500 mg every 6 hours for pain control. If you can take ibuprofen  (NSAIDs), please take them additionally, as per the instructions on the bottle.  Take the oxycodone  only as needed for severe pain. No Tylenol  until after 6:00pm today, if needed.  Diet: Regular. Drink plenty of fluids (water, avoid juice/soda) and eat healthy, high protein, low carbs.  Wound Care: Keep dressing clean & dry. You may change bandages after showering if you continue to notice some drainage.  Special Instructions: Call Doctor if any unusual problems occur such as pain, excessive Bleeding, unrelieved Nausea/vomiting, Fever &/or chills  Drains: Measure drain output from drains every 24 hours. Record this on a log for us  to view during post-op appointments. Drainage will change colors over the next week to two weeks. This is normal. Color of drainage can vary from red-pink-orange-yellow.  Follow-up appointment: As scheduled.   Post Anesthesia Home Care Instructions  Activity: Get plenty of rest for the remainder of the day. A responsible individual must stay with you for 24 hours following the procedure.  For the next 24 hours, DO NOT: -Drive a car -Advertising copywriter -Drink alcoholic beverages -Take any medication unless instructed by your physician -Make any legal decisions or sign important papers.  Meals: Start with liquid foods such as gelatin or  soup. Progress to regular foods as tolerated. Avoid greasy, spicy, heavy foods. If nausea and/or vomiting occur, drink only clear liquids until the nausea and/or vomiting subsides. Call your physician if vomiting continues.  Special Instructions/Symptoms: Your throat may feel dry or sore from the anesthesia or the breathing tube placed in your throat during surgery. If this causes discomfort, gargle with warm salt water. The discomfort should disappear within 24 hours.  If you had a scopolamine  patch placed behind your ear for the management of post- operative nausea and/or vomiting:  1. The medication in the patch is effective for 72 hours, after which it should be removed.  Wrap patch in a tissue and discard in the trash. Wash hands thoroughly with soap and water. 2. You may remove the patch earlier than 72 hours if you experience unpleasant side effects which may include dry mouth, dizziness or visual disturbances. 3. Avoid touching the patch. Wash your hands with soap and water after contact with the patch.   Information for Discharge Teaching: EXPAREL  (bupivacaine  liposome injectable suspension)   Pain relief is important to your recovery. The goal is to control your pain so you can move easier and return to your normal activities as soon as possible after your procedure. Your physician may use several types of medicines to manage pain, swelling, and more.  Your surgeon or anesthesiologist gave you EXPAREL (bupivacaine ) to help control your pain after surgery.  EXPAREL  is a local anesthetic designed to release slowly over an extended period of time to provide pain relief by numbing the tissue around the surgical site. EXPAREL  is designed to release  pain medication over time and can control pain for up to 72 hours. Depending on how you respond to EXPAREL , you may require less pain medication during your recovery. EXPAREL  can help reduce or eliminate the need for opioids during the first few  days after surgery when pain relief is needed the most. EXPAREL  is not an opioid and is not addictive. It does not cause sleepiness or sedation.   Important! A teal colored band has been placed on your arm with the date, time and amount of EXPAREL  you have received. Please leave this armband in place for the full 96 hours following administration, and then you may remove the band. If you return to the hospital for any reason within 96 hours following the administration of EXPAREL , the armband provides important information that your health care providers to know, and alerts them that you have received this anesthetic.    Possible side effects of EXPAREL : Temporary loss of sensation or ability to move in the area where medication was injected. Nausea, vomiting, constipation Rarely, numbness and tingling in your mouth or lips, lightheadedness, or anxiety may occur. Call your doctor right away if you think you may be experiencing any of these sensations, or if you have other questions regarding possible side effects.  Follow all other discharge instructions given to you by your surgeon or nurse. Eat a healthy diet and drink plenty of water or other fluids.About my Jackson-Pratt Bulb Drain  What is a Jackson-Pratt bulb? A Jackson-Pratt is a soft, round device used to collect drainage. It is connected to a long, thin drainage catheter, which is held in place by one or two small stiches near your surgical incision site. When the bulb is squeezed, it forms a vacuum, forcing the drainage to empty into the bulb.  Emptying the Jackson-Pratt bulb- To empty the bulb: 1. Release the plug on the top of the bulb. 2. Pour the bulb's contents into a measuring container which your nurse will provide. 3. Record the time emptied and amount of drainage. Empty the drain(s) as often as your     doctor or nurse recommends.  Date                  Time                    Amount (Drain 1)                 Amount (Drain  2)  _____________________________________________________________________  _____________________________________________________________________  _____________________________________________________________________  _____________________________________________________________________  _____________________________________________________________________  _____________________________________________________________________  _____________________________________________________________________  _____________________________________________________________________  Squeezing the Jackson-Pratt Bulb- To squeeze the bulb: 1. Make sure the plug at the top of the bulb is open. 2. Squeeze the bulb tightly in your fist. You will hear air squeezing from the bulb. 3. Replace the plug while the bulb is squeezed. 4. Use a safety pin to attach the bulb to your clothing. This will keep the catheter from     pulling at the bulb insertion site.  When to call your doctor- Call your doctor if: Drain site becomes red, swollen or hot. You have a fever greater than 101 degrees F. There is oozing at the drain site. Drain falls out (apply a guaze bandage over the drain hole and secure it with tape). Drainage increases daily not related to activity patterns. (You will usually have more drainage when you are active than when you are resting.) Drainage has a bad odor.

## 2024-02-07 ENCOUNTER — Other Ambulatory Visit: Payer: Self-pay

## 2024-02-07 ENCOUNTER — Encounter (HOSPITAL_BASED_OUTPATIENT_CLINIC_OR_DEPARTMENT_OTHER): Payer: Self-pay | Admitting: Plastic Surgery

## 2024-02-07 ENCOUNTER — Telehealth: Payer: Self-pay

## 2024-02-07 NOTE — Telephone Encounter (Signed)
 S/w patient and scheduled her according to 6/26 LOS.  Appointment dates and times confirmed with patient.

## 2024-02-10 ENCOUNTER — Telehealth: Payer: Self-pay | Admitting: Student

## 2024-02-10 NOTE — Telephone Encounter (Signed)
 Patient called inquiring about short-term disability forms, discussed with her that these were completed today and we will get them sent off to her employer.  Patient also states that she had some cramping in her feet last night.  She denies any swelling in her lower extremities, denies any chest pain or shortness of breath.  She states that the cramping in her feet has resolved.  I encouraged her to make sure she is up and moving frequently.  I discussed with her that if she develops any swelling or tenderness in her lower extremities or develops any  shortness of breath or chest pain she needs to go to the emergency room.  She expressed understanding.

## 2024-02-10 NOTE — Telephone Encounter (Signed)
 Patient has questions specifically for you, please reach out

## 2024-02-15 ENCOUNTER — Ambulatory Visit (INDEPENDENT_AMBULATORY_CARE_PROVIDER_SITE_OTHER): Admitting: Plastic Surgery

## 2024-02-15 VITALS — BP 156/77 | HR 72

## 2024-02-15 DIAGNOSIS — Z9889 Other specified postprocedural states: Secondary | ICD-10-CM

## 2024-02-15 NOTE — Progress Notes (Signed)
 Natasha Doyle returns today 9 days postop from a panniculectomy.  She is doing well.  She has had 1 to 2 mL/day out of each drain.  On physical examination the incisions are clean dry and intact.  There is no erythema there is no swelling around the incisions.  The right drain is removed today without difficulty.  If her drain output continues to be as low as it has been over the past 2 days she may return on Friday to have the second drain removed.

## 2024-02-17 ENCOUNTER — Ambulatory Visit: Admitting: Student

## 2024-02-17 DIAGNOSIS — Z9889 Other specified postprocedural states: Secondary | ICD-10-CM

## 2024-02-17 NOTE — Progress Notes (Signed)
 Patient is a 58 year old female who underwent panniculectomy with Dr. Waddell on 02/06/2024.  Patient is almost 2 weeks postop.  She presents to the clinic today for postoperative follow-up.  Patient was last seen in the clinic on 02/15/2024.  At this visit, patient was doing well.  On exam, her incisions were clean dry and intact.  The right drain was removed without any difficulty.  It was discussed with the patient that if her drain output continued to be as low as it was, the patient may return in a few days to have the second drain removed.  Today, patient reports she is doing well.  She states that her drain output on the left side has been less than 15 cc over the past 24 hours, and was 8 cc prior to that.  She denies any fevers or chills.  She does report her binder sometimes causes a little bit of discomfort to the inferior/central aspect of her abdomen.  She denies any other issues or concerns at this time.  Chaperone present on exam.  On exam, patient is sitting upright in no acute distress.  Abdomen is soft and nontender.  There is no overlying erythema.  No obvious fluid collections on exam.  Incision appears to be clean dry and intact.  Left JP drain is in place and functioning with minimal dark serosanguineous drainage of the bulb.  There are no signs of infection on exam.  Left JP drain was removed without any difficulty.  Patient tolerated well.  I discussed with the patient that she may drain from her left drain site over the next few days.  Recommended that she apply gauze and tape over the drain site daily for the next few days.  I discussed with her that she may start applying Vaseline to her incisions into the drain site starting on Monday and she can do this daily.  Patient expressed understanding.  Discussed with the patient the importance of wearing compression at all times.  I discussed with her that her binder may be causing some discomfort based on where to sitting.  I recommended  that she try compression spanks.  She states that she has a [place] that she is going to try.  I discussed with the patient to avoid any strenuous activities or heavy lifting.  Patient to follow back up at her neck scheduled appointment.  I instructed her to call in the meantime she has any questions or concerns about anything.

## 2024-02-25 ENCOUNTER — Other Ambulatory Visit: Payer: Self-pay

## 2024-02-25 ENCOUNTER — Encounter (HOSPITAL_COMMUNITY): Payer: Self-pay | Admitting: *Deleted

## 2024-02-25 ENCOUNTER — Emergency Department (HOSPITAL_COMMUNITY)
Admission: EM | Admit: 2024-02-25 | Discharge: 2024-02-26 | Disposition: A | Discharge status: Home / Self Care | Payer: Self-pay | Attending: Emergency Medicine | Admitting: Emergency Medicine

## 2024-02-25 DIAGNOSIS — R55 Syncope and collapse: Secondary | ICD-10-CM | POA: Insufficient documentation

## 2024-02-25 LAB — CBC WITH DIFFERENTIAL/PLATELET
Abs Immature Granulocytes: 0.02 K/uL (ref 0.00–0.07)
Basophils Absolute: 0.1 K/uL (ref 0.0–0.1)
Basophils Relative: 1 %
Eosinophils Absolute: 0.1 K/uL (ref 0.0–0.5)
Eosinophils Relative: 2 %
HCT: 33.2 % — ABNORMAL LOW (ref 36.0–46.0)
Hemoglobin: 10.6 g/dL — ABNORMAL LOW (ref 12.0–15.0)
Immature Granulocytes: 0 %
Lymphocytes Relative: 19 %
Lymphs Abs: 1.3 K/uL (ref 0.7–4.0)
MCH: 26.8 pg (ref 26.0–34.0)
MCHC: 31.9 g/dL (ref 30.0–36.0)
MCV: 83.8 fL (ref 80.0–100.0)
Monocytes Absolute: 0.8 K/uL (ref 0.1–1.0)
Monocytes Relative: 11 %
Neutro Abs: 4.7 K/uL (ref 1.7–7.7)
Neutrophils Relative %: 67 %
Platelets: 350 K/uL (ref 150–400)
RBC: 3.96 MIL/uL (ref 3.87–5.11)
RDW: 14.6 % (ref 11.5–15.5)
WBC: 7 K/uL (ref 4.0–10.5)
nRBC: 0 % (ref 0.0–0.2)

## 2024-02-25 LAB — I-STAT CHEM 8, ED
BUN: 6 mg/dL (ref 6–20)
Calcium, Ion: 1.15 mmol/L (ref 1.15–1.40)
Chloride: 105 mmol/L (ref 98–111)
Creatinine, Ser: 0.8 mg/dL (ref 0.44–1.00)
Glucose, Bld: 112 mg/dL — ABNORMAL HIGH (ref 70–99)
HCT: 33 % — ABNORMAL LOW (ref 36.0–46.0)
Hemoglobin: 11.2 g/dL — ABNORMAL LOW (ref 12.0–15.0)
Potassium: 4 mmol/L (ref 3.5–5.1)
Sodium: 139 mmol/L (ref 135–145)
TCO2: 22 mmol/L (ref 22–32)

## 2024-02-25 LAB — BASIC METABOLIC PANEL WITH GFR
Anion gap: 9 (ref 5–15)
BUN: 6 mg/dL (ref 6–20)
CO2: 23 mmol/L (ref 22–32)
Calcium: 8.6 mg/dL — ABNORMAL LOW (ref 8.9–10.3)
Chloride: 104 mmol/L (ref 98–111)
Creatinine, Ser: 0.65 mg/dL (ref 0.44–1.00)
GFR, Estimated: >60 mL/min (ref >60)
Glucose, Bld: 111 mg/dL — ABNORMAL HIGH (ref 70–99)
Potassium: 3.9 mmol/L (ref 3.5–5.1)
Sodium: 136 mmol/L (ref 135–145)

## 2024-02-25 LAB — CBG MONITORING, ED: Glucose-Capillary: 117 mg/dL — ABNORMAL HIGH (ref 70–99)

## 2024-02-25 NOTE — ED Provider Triage Note (Signed)
 Emergency Medicine Provider Triage Evaluation Note  Natasha Doyle , a 58 y.o. female  was evaluated in triage.  Pt complains of feeling light headed for the past 2 days, feeling cold. 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 Woke up lying on the ground, unwitnessed, unsure how long, suspects less than a few min. Woke up still sweating, panting. CBG 136, vitals ok. Hx IDA, takes iron, baseline 10.6. Currently with pain to right side of head, feels cold.  Pinealectomy 02/02/24 with Dr. Waddell Review of Systems  Positive:  Negative:   Physical Exam  BP 135/74 (BP Location: Right Arm)   Pulse 77   Temp 99.3 F (37.4 C)   Resp 20   LMP 09/03/2012   SpO2 100%  Gen:   Awake, no distress   Resp:  Normal effort  MSK:   Moves extremities without difficulty  Other:    Medical Decision Making  Medically screening exam initiated at 10:25 PM.  Appropriate orders placed.  Natasha Doyle was informed that the remainder of the evaluation will be completed by another provider, this initial triage assessment does not replace that evaluation, and the importance of remaining in the ED until their evaluation is complete.     Natasha Doyle LABOR, PA-C 02/25/24 2227

## 2024-02-25 NOTE — ED Triage Notes (Signed)
 The pt fainted at work tonight  she has had colds spells for the past 2 days  feeling some dizziness before today

## 2024-02-26 MED ORDER — SODIUM CHLORIDE 0.9 % IV BOLUS
1000.0000 mL | Freq: Once | INTRAVENOUS | Status: AC
  Administered 2024-02-26: 1000 mL via INTRAVENOUS

## 2024-02-26 NOTE — Discharge Instructions (Signed)
 You were seen today after an episode of passing out.  This is likely related to a vasovagal episode.  Could be related to recently going back to work and overexerting yourself more than you have been recently given your recent operation.  Make sure that you are staying hydrated.  Take frequent breaks if necessary.

## 2024-02-26 NOTE — ED Provider Notes (Signed)
 [place] EMERGENCY DEPARTMENT AT [facility] Provider Note   CSN: [id number] Arrival date & time: 02/25/24  2129     Patient presents with: Loss of Consciousness   Natasha Doyle is a 58 y.o. female.   HPI     This a 58 year old female who presents with an episode of syncope.  Patient reports recent history of panniculectomy.  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  She began to try to drink but then all of a sudden passed out.  This has never happened to her before.  She did feel nauseated before this happened.  No postictal state.  No known history of seizures.  History of chronic anemia.  Prior to Admission medications   Medication Sig Start Date End Date Taking? Authorizing Provider  acetaminophen -codeine  (TYLENOL  #3) 300-30 MG tablet Take 1-2 tablets by mouth every 6 (six) hours as needed for moderate pain (pain score 4-6). 09/18/23   White, Elizabeth A, PA-C  albuterol  (VENTOLIN  HFA) 108 (90 Base) MCG/ACT inhaler TAKE 2 PUFFS BY MOUTH EVERY 6 HOURS AS NEEDED FOR WHEEZE OR SHORTNESS OF BREATH 11/02/23   Job Lukes, PA  B Complex Vitamins (B COMPLEX 1 PO) Take 1 tablet by mouth daily in the afternoon.    [provider]  dicyclomine  (BENTYL ) 20 MG tablet Take 1 tablet (20 mg total) by mouth 2 (two) times daily as needed for spasms. 04/07/23   Job Lukes, PA  lisinopril  (ZESTRIL ) 10 MG tablet Take 10 mg by mouth daily.    [provider]  Multiple Vitamins-Calcium (ONE-A-DAY WOMENS PO) Take 1 tablet by mouth daily.    [provider]  ondansetron  (ZOFRAN ) 4 MG tablet Take 1 tablet (4 mg total) by mouth every 8 (eight) hours as needed for up to 20 doses for nausea or vomiting. 01/16/24   Andris Estefana BRAVO, PA-C  ondansetron  (ZOFRAN -ODT) 4 MG disintegrating tablet Take 1 tablet (4 mg total) by mouth every 8 (eight) hours  as needed for nausea or vomiting. 05/24/23   Schutt, Marsa HERO, PA-C  oxyCODONE  (ROXICODONE ) 5 MG immediate release tablet Take 1 tablet (5 mg total) by mouth every 6 (six) hours as needed for up to 20 doses for severe pain (pain score 7-10). 01/16/24   Andris Estefana BRAVO, PA-C  pantoprazole  (PROTONIX ) 40 MG tablet Take 1 tablet (40 mg total) by mouth daily. 10/02/21   Job Lukes, PA  triamcinolone  (KENALOG ) 0.1 % paste Use as directed 1 Application in the mouth or throat 2 (two) times daily. 12/19/23   Wendolyn Jenkins Jansky, MD  Vitamin E 45 MG (100 UNIT) CAPS Take 1 capsule by mouth daily in the afternoon.    [provider]    Allergies: Patient has no known allergies.    Review of Systems  Constitutional:  Negative for fever.  Respiratory:  Negative for shortness of breath.   Cardiovascular:  Negative for chest pain.  Gastrointestinal:  Positive for nausea. Negative for abdominal pain.  Neurological:  Positive for syncope and light-headedness.  All other systems reviewed and are negative.   Updated Vital Signs BP 132/78   Pulse 68   Temp 98.9 F (37.2 C) (Oral)   Resp 18   Ht 1.626 m (5' 4)   Wt 85 kg   LMP 09/03/2012   SpO2 98%   BMI 32.17 kg/m   Physical  Exam Vitals and nursing note reviewed.  Constitutional:      Appearance: She is well-developed. She is not ill-appearing.  HENT:     Head: Normocephalic and atraumatic.  Eyes:     Pupils: Pupils are equal, round, and reactive to light.  Cardiovascular:     Rate and Rhythm: Normal rate and regular rhythm.     Heart sounds: Normal heart sounds.  Pulmonary:     Effort: Pulmonary effort is normal. No respiratory distress.     Breath sounds: No wheezing.  Abdominal:     General: Bowel sounds are normal.     Palpations: Abdomen is soft.     Tenderness: There is no abdominal tenderness.     Comments: Well-healing panniculectomy scar  Musculoskeletal:     Cervical back: Neck supple.  Skin:    General: Skin  is warm and dry.  Neurological:     Mental Status: She is alert and oriented to person, place, and time.  Psychiatric:        Mood and Affect: Mood normal.     (all labs ordered are listed, but only abnormal results are displayed) Labs Reviewed  BASIC METABOLIC PANEL WITH GFR - Abnormal; Notable for the following components:      Result Value   Glucose, Bld 111 (*)    Calcium 8.6 (*)    All other components within normal limits  CBC WITH DIFFERENTIAL/PLATELET - Abnormal; Notable for the following components:   Hemoglobin 10.6 (*)    HCT 33.2 (*)    All other components within normal limits  CBG MONITORING, ED - Abnormal; Notable for the following components:   Glucose-Capillary 117 (*)    All other components within normal limits  I-STAT CHEM 8, ED - Abnormal; Notable for the following components:   Glucose, Bld 112 (*)    Hemoglobin 11.2 (*)    HCT 33.0 (*)    All other components within normal limits    EKG: EKG Interpretation Date/Time:  Saturday February 25 2024 22:41:01 EDT Ventricular Rate:  74 PR Interval:  178 QRS Duration:  80 QT Interval:  362 QTC Calculation: 401 R Axis:   63  Text Interpretation: Normal sinus rhythm Cannot rule out Anterior infarct , age undetermined Abnormal ECG When compared with ECG of 30-Jan-2024 13:27, PREVIOUS ECG IS PRESENT Confirmed by Bari Pfeiffer ([id number]) on 02/26/2024 2:11:59 AM  Radiology: No results found.   Procedures   Medications Ordered in the ED  sodium chloride  0.9 % bolus 1,000 mL (1,000 mLs Intravenous New Bag/Given 02/26/24 0254)                                    Medical Decision Making  This patient presents to the ED for concern of syncope, this involves an extensive number of treatment options, and is a complaint that carries with it a high risk of complications and morbidity.  I considered the following differential and admission for this acute, potentially life threatening condition.  The differential  diagnosis includes vasovagal, dehydration, less likely arrhythmia  MDM:    This is a 58 year old female who presents with an episode of passing out.  She is nontoxic and vital signs are reassuring on arrival.  EKG shows no evidence of acute arrhythmia.  Patient just darted back to work.  She did have a prodrome which is reassuring and suggestive mostly of vasovagal episode.  She is not  orthostatic but was given fluids.  Labs obtained and reassuring.  No significant metabolic derangements.  (Labs, imaging, consults)  Labs: I Ordered, and personally interpreted labs.  The pertinent results include: CBC, BMP  Imaging Studies ordered: I ordered imaging studies including none I independently visualized and interpreted imaging. I agree with the radiologist interpretation  Additional history obtained from chart review.  External records from outside source obtained and reviewed including prior evaluations  Cardiac Monitoring: The patient was maintained on a cardiac monitor.  If on the cardiac monitor, I personally viewed and interpreted the cardiac monitored which showed an underlying rhythm of: Sinus  Reevaluation: After the interventions noted above, I reevaluated the patient and found that they have :improved  Social Determinants of Health:  lives independently  Disposition: Discharge  Co morbidities that complicate the patient evaluation  Past Medical History:  Diagnosis Date   Deficiency anemia 12/05/2023   GERD (gastroesophageal reflux disease)    Obesity    Vaginal delivery 1989, 1992     Medicines Meds ordered this encounter  Medications   sodium chloride  0.9 % bolus 1,000 mL    I have reviewed the patients home medicines and have made adjustments as needed  Problem List / ED Course: Problem List Items Addressed This Visit   None Visit Diagnoses       Vasovagal syncope    -  Primary                Final diagnoses:  Vasovagal syncope    ED Discharge  Orders     None          Bari Charmaine FALCON, MD 02/26/24 [phone]

## 2024-02-27 ENCOUNTER — Ambulatory Visit (INDEPENDENT_AMBULATORY_CARE_PROVIDER_SITE_OTHER): Payer: Self-pay | Admitting: Student

## 2024-02-27 VITALS — BP 149/88 | HR 72

## 2024-02-27 DIAGNOSIS — Z9889 Other specified postprocedural states: Secondary | ICD-10-CM

## 2024-02-27 MED ORDER — DOXYCYCLINE HYCLATE 100 MG PO TABS: 100.0000 mg | ORAL_TABLET | Freq: Two times a day (BID) | ORAL | 0 refills | Status: AC

## 2024-02-27 NOTE — Progress Notes (Signed)
 Patient is a 58 year old female who underwent panniculectomy with Dr. Waddell on 02/06/2024.  Patient is 3 weeks postop.  She presents to the clinic today for postoperative follow-up.     Patient was last seen in the clinic on 02/17/2024.  At this visit, patient was doing well.  On exam, abdomen was soft and nontender.  There is no overlying erythema.  No fluid collections on exam.  Incision was clean dry and intact.  Left JP drain was removed without any difficulty.  Today, patient reports she is doing okay.  She states that she passed out at work the other day.  She states she went to the emergency room to be evaluated and that they did an EKG which showed to be normal.  She states that they gave her fluids and attributed her vasovagal syncope to dehydration.  She states that there was no indication it was from her surgical procedure.  She states that they checked her hemoglobin, which was normal.  She states that she is feeling okay today.  She states that she is not feeling dizzy or lightheaded.  She states that she has been drinking plenty of water and ambulating without any difficulty.  Patient does report though that about 4 days ago, she noticed an area of redness and tenderness to her abdomen.  She states that it is sore when she touches it.  She denies any fevers.  She denies any drainage from the area.  She does not report any other issues or concerns at this time.  Chaperone present on exam.  On exam, patient is sitting upright in no acute distress.  Abdomen is overall soft and nontender.  There is an area of erythema just left of midline.  It is mildly tender to touch and slightly indurated.  It does not appear to be fluctuant.  There is no crepitance noted on exam.  There is no drainage upon palpation.  There is no other overlying erythema on exam.  No obvious fluid collection on exam.  Incision appears to be intact and healing well.  There were several Monocryl sutures that were cut and removed.   Patient tolerated well.  I discussed with the patient that it appears she has cellulitis near the incision.  I recommended that we start her on an antibiotic.  She states that she has taken doxycycline  in the past and done well with it.  Will go ahead and send her 7 days worth of doxycycline .  I discussed with the patient that I would like her to closely monitor the area, and if she feels that it is growing in size, worsening in pain, if she develops fevers or chills she needs to call us  immediately.  Patient expressed understanding.  I discussed with the patient to continue with the Vaseline to her incision and continue with compression at all times.  She expressed understanding  Recommended that she come back to our clinic later this week or early next week.  Also encouraged the patient to continue to drink plenty of fluids and stay hydrated.  She expressed understanding.  Pictures were obtained of the patient and placed in the chart with the patient's or guardian's permission.

## 2024-02-28 ENCOUNTER — Inpatient Hospital Stay: Payer: Self-pay | Attending: Physician Assistant

## 2024-02-29 NOTE — Telephone Encounter (Signed)
 Addend: Suspect that the patient had a small fluid collection that is now draining, recommended that she continue to allow the area to drain

## 2024-02-29 NOTE — Telephone Encounter (Signed)
 I called the patient and spoke with her.  She states that last night, she developed a blister which opened to her left abdomen and states that yellowish/reddish drainage poured out from the area.  She states that she had some more drainage this morning.  She denies any fevers or chills.  She reports that the tenderness that she was experiencing before has actually been getting better.  She reports that she has been taking the doxycycline .  I recommended that patient continue to allow the area to drain.  Discussed with her to apply ABD pads or dressings over the area daily or if the dressings become saturated she should go ahead and change them.  She expressed understanding.  Discussed with the patient to continue with compression at all times and to continue taking the antibiotic.  She expressed understanding.  I did offer the patient an appointment today, but she states that she will continue to monitor things closely and let us  know if she has any further changes.  I would like to see the patient back early next week.  She expressed understanding.  Patient to call back if she has any other concerns or changes.

## 2024-03-05 ENCOUNTER — Encounter: Payer: Self-pay | Admitting: Student

## 2024-03-05 ENCOUNTER — Ambulatory Visit (INDEPENDENT_AMBULATORY_CARE_PROVIDER_SITE_OTHER): Payer: Self-pay | Admitting: Student

## 2024-03-05 VITALS — BP 162/76 | HR 66 | Ht 63.5 in | Wt 186.2 lb

## 2024-03-05 DIAGNOSIS — Z9889 Other specified postprocedural states: Secondary | ICD-10-CM

## 2024-03-05 MED ORDER — DOXYCYCLINE HYCLATE 100 MG PO TABS
100.0000 mg | ORAL_TABLET | Freq: Two times a day (BID) | ORAL | 0 refills | Status: AC
Start: 2024-03-05 — End: 2024-03-10

## 2024-03-05 NOTE — Progress Notes (Signed)
 Patient is a 58 year old female who underwent panniculectomy with Dr. Waddell on 02/06/2024.  Patient is 4 weeks postop.  She presents to the clinic today for postoperative follow-up.  Patient was last seen in the clinic on 02/27/2024.  At this visit, she reported that she noticed an area of redness that was tender.  On exam, abdomen was overall soft and nontender.  There was an area of erythema just left of midline that was mildly tender to touch and slightly indurated.  It did not appear to be fluctuant.  Incision was overall intact and healing well.  Patient was started on an antibiotic.  Today, patient reports she is doing well.  She states that last week it opened up and was draining a significant amount of fluid.  She states that now it is barely draining.  She states that the tenderness has completely resolved.  She does not report any fevers or chills.  She does not report any other issues or concerns.  She states that she has been taking the antibiotic and is almost out.  Chaperone present on exam.  On exam, patient is sitting upright in no acute distress.  Abdomen is overall soft and nontender.  There is no obvious fluid collection on exam.  Area just left of midline appears to have improved significantly since previous exam.  There is no tenderness to palpation in the area.  Erythema does appear to be resolving.  There was a very minimal amount of serous drainage upon palpation.  No purulent drainage noted.  Incision otherwise is intact and healing well.  There was a Monocryl suture that was cut and removed.  Patient tolerated well.  I discussed with the patient that I would like her to continue the antibiotics for a few more days.  I discussed with her that she should continue to allow the area to drain.  Recommended she apply calcium alginate and dressing over the area daily.  She expressed understanding.  Recommended that she apply Vaseline to the remainder of her incision.  Discussed with her  to continue with compression at all times.  She expressed understanding.  Patient to follow back up next week.  I instructed her to call if she has any questions or concerns about anything in the meantime.

## 2024-03-08 ENCOUNTER — Inpatient Hospital Stay: Payer: Self-pay | Admitting: Hematology and Oncology

## 2024-03-08 ENCOUNTER — Encounter: Payer: Self-pay | Admitting: Hematology and Oncology

## 2024-03-08 NOTE — Progress Notes (Signed)
 Sent letter to patients home address due to no show appt.

## 2024-03-10 ENCOUNTER — Telehealth: Payer: Self-pay | Admitting: Hematology and Oncology

## 2024-03-10 NOTE — Telephone Encounter (Signed)
 Rescheduled appointments per inbasket message. Talked with the patient and she is aware of the made appointments.

## 2024-03-12 ENCOUNTER — Inpatient Hospital Stay: Payer: Self-pay

## 2024-03-12 ENCOUNTER — Ambulatory Visit: Payer: Self-pay | Admitting: Student

## 2024-03-12 DIAGNOSIS — Z9889 Other specified postprocedural states: Secondary | ICD-10-CM

## 2024-03-12 NOTE — Progress Notes (Signed)
 Patient is a 58 year old female who underwent panniculectomy with Dr. Waddell on 02/06/2024.  Patient is 5 weeks postop.  She presents to the clinic today for postoperative follow-up.     Patient was last seen in the clinic on 03/05/2024.  At that visit, she was doing well.  She reported that the area that was bothering her had opened up and that the tenderness had completely resolved.  She did not report any fevers or chills.  On exam, abdomen was overall soft and nontender.  There is no obvious fluid collection on exam.  Area just left of midline appeared to have improved significantly.  Incision was otherwise intact and healing well.  It was recommended that patient continue with antibiotics and continue with her compression at all times.  She expressed understanding.  Today, patient reports she is doing well.  She states that she has finished her antibiotic and the area has stopped draining.  She states that she is not having any pain anymore.  She denies any fevers or chills.  She does not have any other issues or concerns at this time.  Chaperone present on exam.  On exam, patient is sitting upright in no acute distress.  Abdomen is overall soft and nontender.  There is no overlying erythema, no fluid collections palpated on exam.  Area just left of midline appears to be healing well, there is no active drainage on exam.  No overlying erythema.  The skin does appear to be slightly irritated.  Infection appears to be completely resolved.  Remainder of the abdominal incision appears to be well-healed.  There is a little bit of firmness throughout the inferior abdominal incision consistent with scar tissue.  I recommended that patient continue to apply Vaseline to the area where the skin is irritated and gently massage the area as well as massage the remainder of her incision to help soften up the scar.  I discussed with her that she may start scar creams or silicone tapes to the remainder of her incision.   Patient expressed understanding.  I discussed with the patient that as of next week, she no longer has to wear her compression and she may start increasing her activities.  Patient expressed understanding.  Patient to follow back up in 2 weeks.  I instructed her to call if she has any questions or concerns about anything.

## 2024-03-19 ENCOUNTER — Encounter: Payer: Self-pay | Admitting: Hematology and Oncology

## 2024-03-19 ENCOUNTER — Inpatient Hospital Stay: Payer: Self-pay | Attending: Hematology and Oncology | Admitting: Hematology and Oncology

## 2024-03-26 ENCOUNTER — Encounter: Payer: Self-pay | Admitting: Student

## 2024-03-27 ENCOUNTER — Encounter: Payer: Self-pay | Admitting: Student

## 2024-03-27 ENCOUNTER — Ambulatory Visit: Payer: Self-pay

## 2024-03-27 ENCOUNTER — Ambulatory Visit (INDEPENDENT_AMBULATORY_CARE_PROVIDER_SITE_OTHER): Payer: Self-pay | Admitting: Student

## 2024-03-27 VITALS — BP 138/76 | HR 73 | Ht 63.5 in | Wt 186.0 lb

## 2024-03-27 DIAGNOSIS — Z9889 Other specified postprocedural states: Secondary | ICD-10-CM

## 2024-03-27 NOTE — Progress Notes (Signed)
 Patient is a 58 year old female who underwent panniculectomy with Dr. Waddell on 02/06/2024.  Patient is about 7 weeks postop.  She presents to the clinic today for postoperative follow-up.  Patient was last seen in the clinic on 03/12/2024.  At this visit, patient was doing well.  On exam, abdomen was overall soft and nontender.  Area just left of midline appeared to be healing well.  Infection appeared to be completely resolved.  Recommended that patient continue to apply Vaseline to the area and gently massage the area as well.  Today, patient reports she is doing well.  She states that over the last week, she has been somewhat bloated and gassy, but states that she is going to follow-up with her PCP in regards to this.  She states that she is still having bowel movements.  She denies any other issues or concerns at this time.  She denies any fevers or chills.  She denies any drainage.  She open present on exam.  On exam, patient is sitting upright in no acute distress.  Abdomen is soft and nontender.  There is no overlying erythema.  No obvious fluid collections on exam.  Incision is intact and well-healed.  There are no signs of infection on exam.  Area just left of midline has completely healed and skin does not appear to be irritated anymore.  Discussed with patient that she may continue with scar creams and may use the scar creams throughout the whole incision.  Discussed with her that she should continue to gently massage the incision.  Patient expressed understanding.  Discussed with the patient that she no longer has to wear compression and she no longer has restrictions.  Patient to follow-up as needed.  Instructed her to call if she has any questions or concerns.  Pictures were obtained of the patient and placed in the chart with the patient's or guardian's permission.

## 2024-03-27 NOTE — Telephone Encounter (Signed)
 FYI Only or Action Required?: FYI only for provider.  Patient was last seen in primary care on 11/15/2023 by Job Lukes, PA.  Called Nurse Triage reporting Leg Swelling.  Symptoms began several days ago.  Interventions attempted: Other: elevate legs, wear TED hose.  Symptoms are: mild lower leg swelling, mild abdominal bloating, difficulty passing flatulence or belching stable.  Triage Disposition: See PCP When Office is Open (Within 3 Days)  Patient/caregiver understands and will follow disposition?: Yes          Copied from CRM [id number]. Topic: Clinical - Red Word Triage >> Mar 27, 2024 12:04 PM Frederich PARAS wrote: [facility] that prompted transfer to Nurse Triage: gas pain, swelling in legs   gas building up, fluid, swelling in legs, gas pain, going on about 5 days. always gassy hard to burp and pass gass. Reason for Disposition  [1] MILD swelling of both ankles (i.e., pedal edema) AND [2] new-onset or getting worse  Answer Assessment - Initial Assessment Questions 1. ONSET: When did the swelling start? (e.g., minutes, hours, days)     Friday last week.  2. LOCATION: What part of the leg is swollen?  Are both legs swollen or just one leg?     Both legs, up to mid calf.  3. SEVERITY: How bad is the swelling? (e.g., localized; mild, moderate, severe)     Elevated and put on TED hose and saw improvement. She states she stopped wearing the TED hose since Saturday and the swelling returned. Mild.  4. REDNESS: Is there redness or signs of infection?     No.  5. PAIN: Is the swelling painful to touch? If Yes, ask: How painful is it?   (Scale 1-10; mild, moderate or severe)     No.  6. FEVER: Do you have a fever? If Yes, ask: What is it, how was it measured, and when did it start?      No.  7. CAUSE: What do you think is causing the leg swelling?     She thinks it may be related to blood pressure.  8. MEDICAL HISTORY: Do you have a history of blood  clots (e.g., DVT), cancer, heart failure, kidney disease, or liver failure?     No. Patient did have recent surgery August 25th.  9. RECURRENT SYMPTOM: Have you had leg swelling before? If Yes, ask: When was the last time? What happened that time?     Yes. She states she has a history of edema in her legs and was on a blood pressure pill with diuretic. Her blood pressure was dropping low so she has been weaned off the medication. She states today at her post op visit her BP 138/76 and has been running higher than that. She thinks she may need to go back on the medication.  10. OTHER SYMPTOMS: Do you have any other symptoms? (e.g., chest pain, difficulty breathing)       She states on Sunday she felt very bloated and somewhat still there. Difficulty belching or passing flatulence. Denies constipation or diarrhea, distended abdomen, chest pain, SOB.  11. PREGNANCY: Is there any chance you are pregnant? When was your last menstrual period?       N/A.  Protocols used: Leg Swelling and Edema-A-AH

## 2024-03-27 NOTE — Telephone Encounter (Signed)
 Noted

## 2024-03-28 ENCOUNTER — Ambulatory Visit: Admitting: Physician Assistant

## 2024-03-28 VITALS — BP 130/70 | HR 69 | Temp 97.3°F | Ht 63.5 in | Wt 186.0 lb

## 2024-03-28 DIAGNOSIS — R141 Gas pain: Secondary | ICD-10-CM | POA: Diagnosis not present

## 2024-03-28 DIAGNOSIS — Z23 Encounter for immunization: Secondary | ICD-10-CM

## 2024-03-28 DIAGNOSIS — M7989 Other specified soft tissue disorders: Secondary | ICD-10-CM

## 2024-03-28 DIAGNOSIS — D508 Other iron deficiency anemias: Secondary | ICD-10-CM | POA: Diagnosis not present

## 2024-03-28 DIAGNOSIS — I10 Essential (primary) hypertension: Secondary | ICD-10-CM

## 2024-03-28 MED ORDER — FUROSEMIDE 20 MG PO TABS
20.0000 mg | ORAL_TABLET | Freq: Every day | ORAL | 1 refills | Status: AC | PRN
Start: 2024-03-28 — End: ?

## 2024-03-28 NOTE — Progress Notes (Signed)
 Natasha Doyle is a 58 y.o. female here for a follow up of a pre-existing problem.  History of Present Illness:   Chief Complaint  Patient presents with   Leg Swelling    Pt c/o bilateral leg swelling, feet to calf's, started last Thursday 10/9. Has been wearing compression socks and elevating.    Discussed the use of AI scribe software for clinical note transcription with the patient, who gave verbal consent to proceed.  History of Present Illness   Natasha Doyle is a 58 year old female who presents with post-surgical swelling and gastrointestinal symptoms.  She underwent surgery in August and experiences post-operative swelling, particularly in her legs, most severe from Thursday to Monday. Compression socks and leg elevation provide relief. She has a history of leg swelling prior to surgery. There is no associated leg pain.  She experiences gastrointestinal symptoms, including difficulty passing gas and burping, which cause significant pain. Walking alleviates the symptoms. Despite daily pantoprazole , she continues to have heartburn. She suspects her daily iron supplements may contribute to her discomfort. Bowel movements occur daily without constipation, though they are not large.  She experienced syncope on her first day back to work, attributed to dehydration, and was treated with fluids. She developed a post-surgical skin infection, treated with antibiotics, with resolution after a blister drained.  She is not consistently taking her blood pressure medication due to concerns about low blood pressure. Her medication was adjusted from 25 mg to 10 mg following an episode of hypotension. She is not on a diuretic.        Past Medical History:  Diagnosis Date   Deficiency anemia 12/05/2023   GERD (gastroesophageal reflux disease)    Obesity    Vaginal delivery 1989, 1992     Social History   Tobacco Use   Smoking status: Never   Smokeless tobacco: Never  Vaping Use    Vaping status: Never Used  Substance Use Topics   Alcohol use: Not Currently   Drug use: No    Past Surgical History:  Procedure Laterality Date   BARIATRIC SURGERY     BREATH TEK H PYLORI N/A 10/22/2014   Procedure: BREATH TEK H PYLORI;  Surgeon: Camellia Blush, MD;  Location: [facility];  Service: General;  Laterality: N/A;   CHOLECYSTECTOMY     HERNIA REPAIR     HIATAL HERNIA REPAIR  01/04/2020   LAPAROSCOPIC GASTRIC SLEEVE RESECTION N/A 04/29/2015   Procedure: LAPAROSCOPIC GASTRIC SLEEVE RESECTION;  Surgeon: Thom CHRISTELLA Pin, MD;  Location: [facility];  Service: General;  Laterality: N/A;   PANNICULECTOMY N/A 02/06/2024   Procedure: PANNICULECTOMY;  Surgeon: Waddell Leonce NOVAK, MD;  Location: [facility];  Service: Plastics;  Laterality: N/A;    Family History  Problem Relation Age of Onset   Cancer Mother        breast   Hypertension Mother    Hyperlipidemia Mother    Alcohol abuse Father    Breast cancer Sister    COPD Sister    Breast cancer Sister    Diabetes Sister    Heart disease Brother    Hyperlipidemia Brother    Hypertension Brother     Allergies  Allergen Reactions   Oxycodone  Other (See Comments)    jittery    Current Medications:   Current Outpatient Medications:    acetaminophen -codeine  (TYLENOL  #3) 300-30 MG tablet, Take 1-2 tablets by mouth every 6 (six) hours as needed for moderate pain (pain score 4-6)., Disp:  10 tablet, Rfl: 0   albuterol  (VENTOLIN  HFA) 108 (90 Base) MCG/ACT inhaler, TAKE 2 PUFFS BY MOUTH EVERY 6 HOURS AS NEEDED FOR WHEEZE OR SHORTNESS OF BREATH, Disp: 8.5 each, Rfl: 2   B Complex Vitamins (B COMPLEX 1 PO), Take 1 tablet by mouth daily in the afternoon., Disp: , Rfl:    dicyclomine  (BENTYL ) 20 MG tablet, Take 1 tablet (20 mg total) by mouth 2 (two) times daily as needed for spasms., Disp: 30 tablet, Rfl: 0   lisinopril  (ZESTRIL ) 10 MG tablet, Take 10 mg by mouth daily., Disp: , Rfl:    Multiple Vitamins-Calcium (ONE-A-DAY  WOMENS PO), Take 1 tablet by mouth daily., Disp: , Rfl:    ondansetron  (ZOFRAN ) 4 MG tablet, Take 1 tablet (4 mg total) by mouth every 8 (eight) hours as needed for up to 20 doses for nausea or vomiting., Disp: 20 tablet, Rfl: 0   ondansetron  (ZOFRAN -ODT) 4 MG disintegrating tablet, Take 1 tablet (4 mg total) by mouth every 8 (eight) hours as needed for nausea or vomiting., Disp: 20 tablet, Rfl: 0   pantoprazole  (PROTONIX ) 40 MG tablet, Take 1 tablet (40 mg total) by mouth daily., Disp: 90 tablet, Rfl: 1   triamcinolone  (KENALOG ) 0.1 % paste, Use as directed 1 Application in the mouth or throat 2 (two) times daily., Disp: 5 g, Rfl: 0   Vitamin E 45 MG (100 UNIT) CAPS, Take 1 capsule by mouth daily in the afternoon., Disp: , Rfl:    Review of Systems:   Negative unless otherwise specified per HPI.  Vitals:   Vitals:   03/28/24 1121  BP: 130/70  Pulse: 69  Temp: (!) 97.3 F (36.3 C)  TempSrc: Temporal  SpO2: 95%  Weight: 186 lb (84.4 kg)  Height: 5' 3.5 (1.613 m)     Body mass index is 32.43 kg/m.  Physical Exam:   Physical Exam Vitals and nursing note reviewed.  Constitutional:      General: She is not in acute distress.    Appearance: She is well-developed. She is not ill-appearing or toxic-appearing.  Cardiovascular:     Rate and Rhythm: Normal rate and regular rhythm.     Pulses: Normal pulses.     Heart sounds: Normal heart sounds, S1 normal and S2 normal.     Comments: Trace b/l edema Pulmonary:     Effort: Pulmonary effort is normal.     Breath sounds: Normal breath sounds.  Abdominal:     General: Abdomen is flat. Bowel sounds are normal.     Palpations: Abdomen is soft.     Tenderness: There is no abdominal tenderness.  Musculoskeletal:     Right lower leg: Edema present.     Left lower leg: Edema present.     Comments: No calf swelling/tenderness to palpation   Skin:    General: Skin is warm and dry.  Neurological:     Mental Status: She is alert.      GCS: GCS eye subscore is 4. GCS verbal subscore is 5. GCS motor subscore is 6.  Psychiatric:        Speech: Speech normal.        Behavior: Behavior normal. Behavior is cooperative.     Assessment and Plan:   Assessment and Plan    Leg swelling Swelling improved with compression and elevation. No pain or DVT signs. Inconsistent antihypertensive use may contribute to fluid retention. - Prescribe PRN Lasix  for swelling. - Advise BP monitoring with Lasix  to prevent  hypotension. - Continue compression stockings and leg elevation. - Educated on consistent antihypertensive adherence. - consider ultrasound if no improvement  Essential HTN Managed with reduced lisinopril  dose due to past hypotension. Occasional lightheadedness and inconsistent adherence reported. - Continue lisinopril  regimen. - Regular BP monitoring, especially with Lasix .  Abdominal gas pain Persistent symptoms despite pantoprazole . Iron supplementation may contribute to discomfort. - Recommend Gas-X as needed. - Provide dietary handout to increase fiber. - Encourage walking for symptom relief. - Advise dietary monitoring and adjustment.  Iron deficiency anemia on oral iron therapy On daily ferrous sulfate. GI discomfort possibly related to iron. Missed hematology appointment for infusion evaluation. - Follow up with hematologist for infusion reassessment. - Continue current iron supplementation.      Lucie Buttner, PA-C
